# Patient Record
Sex: Male | Born: 1950 | Race: White | Hispanic: No | Smoking: Current every day smoker
Health system: Southern US, Community
[De-identification: ages and names within clinical notes are randomized; demographics above are authoritative.]

## PROBLEM LIST (undated history)

## (undated) DIAGNOSIS — H409 Unspecified glaucoma: Secondary | ICD-10-CM

## (undated) DIAGNOSIS — G4730 Sleep apnea, unspecified: Secondary | ICD-10-CM

## (undated) DIAGNOSIS — Q282 Arteriovenous malformation of cerebral vessels: Secondary | ICD-10-CM

## (undated) DIAGNOSIS — C801 Malignant (primary) neoplasm, unspecified: Secondary | ICD-10-CM

## (undated) DIAGNOSIS — J449 Chronic obstructive pulmonary disease, unspecified: Secondary | ICD-10-CM

## (undated) HISTORY — DX: Sleep apnea, unspecified: G47.30

## (undated) HISTORY — DX: Malignant (primary) neoplasm, unspecified: C80.1

## (undated) HISTORY — DX: Chronic obstructive pulmonary disease, unspecified: J44.9

---

## 2009-08-30 DIAGNOSIS — C801 Malignant (primary) neoplasm, unspecified: Secondary | ICD-10-CM

## 2009-08-30 HISTORY — DX: Malignant (primary) neoplasm, unspecified: C80.1

## 2009-08-30 HISTORY — PX: PROSTATE SURGERY: SHX751

## 2018-04-04 DIAGNOSIS — H5200 Hypermetropia, unspecified eye: Secondary | ICD-10-CM | POA: Diagnosis not present

## 2018-04-04 DIAGNOSIS — H2510 Age-related nuclear cataract, unspecified eye: Secondary | ICD-10-CM | POA: Diagnosis not present

## 2018-04-04 DIAGNOSIS — Z0100 Encounter for examination of eyes and vision without abnormal findings: Secondary | ICD-10-CM | POA: Diagnosis not present

## 2018-04-18 ENCOUNTER — Ambulatory Visit (INDEPENDENT_AMBULATORY_CARE_PROVIDER_SITE_OTHER): Payer: Medicare HMO | Admitting: Family Medicine

## 2018-04-18 ENCOUNTER — Encounter: Payer: Self-pay | Admitting: Family Medicine

## 2018-04-18 VITALS — BP 92/66 | HR 98 | Temp 97.1°F | Ht 72.0 in | Wt 225.1 lb

## 2018-04-18 DIAGNOSIS — I739 Peripheral vascular disease, unspecified: Secondary | ICD-10-CM

## 2018-04-18 DIAGNOSIS — F17210 Nicotine dependence, cigarettes, uncomplicated: Secondary | ICD-10-CM

## 2018-04-18 DIAGNOSIS — R6889 Other general symptoms and signs: Secondary | ICD-10-CM | POA: Diagnosis not present

## 2018-04-18 DIAGNOSIS — J439 Emphysema, unspecified: Secondary | ICD-10-CM | POA: Diagnosis not present

## 2018-04-18 DIAGNOSIS — I10 Essential (primary) hypertension: Secondary | ICD-10-CM

## 2018-04-18 DIAGNOSIS — F3341 Major depressive disorder, recurrent, in partial remission: Secondary | ICD-10-CM | POA: Diagnosis not present

## 2018-04-18 DIAGNOSIS — R5383 Other fatigue: Secondary | ICD-10-CM

## 2018-04-18 DIAGNOSIS — N390 Urinary tract infection, site not specified: Secondary | ICD-10-CM

## 2018-04-18 DIAGNOSIS — R319 Hematuria, unspecified: Secondary | ICD-10-CM

## 2018-04-18 DIAGNOSIS — C61 Malignant neoplasm of prostate: Secondary | ICD-10-CM

## 2018-04-18 DIAGNOSIS — F1010 Alcohol abuse, uncomplicated: Secondary | ICD-10-CM

## 2018-04-18 DIAGNOSIS — G4739 Other sleep apnea: Secondary | ICD-10-CM

## 2018-04-18 DIAGNOSIS — E782 Mixed hyperlipidemia: Secondary | ICD-10-CM

## 2018-04-18 HISTORY — DX: Major depressive disorder, recurrent, in partial remission: F33.41

## 2018-04-18 MED ORDER — TRAZODONE HCL 100 MG PO TABS: 50.0000 mg | ORAL_TABLET | Freq: Every day | ORAL | 2 refills | Status: DC

## 2018-04-18 NOTE — Patient Instructions (Signed)

## 2018-04-19 LAB — CBC WITH DIFFERENTIAL/PLATELET
BASOS: 1 %
Basophils Absolute: 0.1 10*3/uL (ref 0.0–0.2)
EOS (ABSOLUTE): 0.2 10*3/uL (ref 0.0–0.4)
EOS: 2 %
HEMATOCRIT: 52.1 % — AB (ref 37.5–51.0)
HEMOGLOBIN: 18 g/dL — AB (ref 13.0–17.7)
Immature Grans (Abs): 0 10*3/uL (ref 0.0–0.1)
Immature Granulocytes: 0 %
LYMPHS ABS: 1.7 10*3/uL (ref 0.7–3.1)
Lymphs: 21 %
MCH: 35 pg — AB (ref 26.6–33.0)
MCHC: 34.5 g/dL (ref 31.5–35.7)
MCV: 101 fL — AB (ref 79–97)
MONOCYTES: 14 %
MONOS ABS: 1.1 10*3/uL — AB (ref 0.1–0.9)
NEUTROS ABS: 5.2 10*3/uL (ref 1.4–7.0)
Neutrophils: 62 %
Platelets: 266 10*3/uL (ref 150–450)
RBC: 5.14 x10E6/uL (ref 4.14–5.80)
RDW: 14.1 % (ref 12.3–15.4)
WBC: 8.3 10*3/uL (ref 3.4–10.8)

## 2018-04-19 LAB — CMP14+EGFR
A/G RATIO: 1.2 (ref 1.2–2.2)
ALBUMIN: 3.6 g/dL (ref 3.6–4.8)
ALK PHOS: 113 IU/L (ref 39–117)
ALT: 18 IU/L (ref 0–44)
AST: 27 IU/L (ref 0–40)
BUN / CREAT RATIO: 8 — AB (ref 10–24)
BUN: 9 mg/dL (ref 8–27)
Bilirubin Total: 0.6 mg/dL (ref 0.0–1.2)
CO2: 28 mmol/L (ref 20–29)
CREATININE: 1.06 mg/dL (ref 0.76–1.27)
Calcium: 9.6 mg/dL (ref 8.6–10.2)
Chloride: 84 mmol/L — ABNORMAL LOW (ref 96–106)
GFR calc Af Amer: 82 mL/min/{1.73_m2} (ref >59)
GFR calc non Af Amer: 71 mL/min/{1.73_m2} (ref >59)
GLOBULIN, TOTAL: 2.9 g/dL (ref 1.5–4.5)
Glucose: 94 mg/dL (ref 65–99)
POTASSIUM: 4.8 mmol/L (ref 3.5–5.2)
SODIUM: 132 mmol/L — AB (ref 134–144)
Total Protein: 6.5 g/dL (ref 6.0–8.5)

## 2018-04-19 LAB — LIPID PANEL
CHOL/HDL RATIO: 2.7 ratio (ref 0.0–5.0)
CHOLESTEROL TOTAL: 182 mg/dL (ref 100–199)
HDL: 67 mg/dL (ref >39)
LDL CALC: 96 mg/dL (ref 0–99)
TRIGLYCERIDES: 95 mg/dL (ref 0–149)
VLDL Cholesterol Cal: 19 mg/dL (ref 5–40)

## 2018-04-19 LAB — TSH: TSH: 1.79 u[IU]/mL (ref 0.450–4.500)

## 2018-04-19 LAB — PSA, TOTAL AND FREE

## 2018-04-19 LAB — VITAMIN B12: Vitamin B-12: 1843 pg/mL — ABNORMAL HIGH (ref 232–1245)

## 2018-04-20 ENCOUNTER — Encounter: Payer: Self-pay | Admitting: Family Medicine

## 2018-04-20 DIAGNOSIS — G4739 Other sleep apnea: Secondary | ICD-10-CM | POA: Insufficient documentation

## 2018-04-20 NOTE — Progress Notes (Signed)
 Subjective:  Patient ID: Steven Howard, male    DOB: 10/02/1950  Age: 67 y.o. MRN: [id number]  CC: New Patient (Initial Visit) (pt here today to establish care)   HPI Steven Howard presents for new patient evaluation.  He recently moved here from [place].  He needs to have a primary care doctor.  He has a complicated history of prostate cancer and BPH with current issues of hesitancy and nocturia.  He has had work-ups in the past for hematuria as well.  Records from his previous urologist and fluoroscopy were reviewed.  Patient also states that he has been treated for elevated blood pressure and elevated cholesterol.  He is currently not taking any prescription medications.  He does take garlic for the cholesterol.  Patient has been a smoker for many years.  He has some dyspnea on exertion but not at rest currently.  He has used an inhaler in the past but currently is doing okay without one.  He has been diagnosed with COPD in the records that were forwarded to the office from his previous primary care physician.  Patient has difficulty getting a good night sleep he awakens multiple times he does not feel rested the next day.  Patient reports that he was diagnosed with sleep apnea when he lived in [place].  He does not know how to get supplies including the machine tubing facemask etc. since moving to [place].  He says that if his energy is not good.  Patient originally mentioned that he has about 3 drinks a week .  On further discussion this is actually 3 beers a day.  Depression screen PHQ 2/9 04/18/2018  Decreased Interest 0  Down, Depressed, Hopeless 0  PHQ - 2 Score 0    History Steven Howard has a past medical history of Cancer (HCC) (2011), COPD (chronic obstructive pulmonary disease) (HCC), and Sleep apnea.   He has a past surgical history that includes Prostate surgery (2011).   His family history includes Alcohol abuse in his brother; COPD in his brother.He reports that he quit  smoking about 10 years ago. His smoking use included cigars and cigarettes. He has a 30.00 pack-year smoking history. He has never used smokeless tobacco. He reports that he drinks about 3.0 standard drinks of alcohol per week. He reports that he does not use drugs.    ROS Review of Systems  Constitutional: Negative for activity change, fatigue and unexpected weight change.  HENT: Negative for congestion, ear pain, hearing loss, postnasal drip and trouble swallowing.   Eyes: Negative for pain and visual disturbance.  Respiratory: Positive for shortness of breath. Negative for cough and chest tightness.   Cardiovascular: Negative for chest pain, palpitations and leg swelling.  Gastrointestinal: Negative for abdominal distention, abdominal pain, blood in stool, constipation, diarrhea, nausea and vomiting.  Endocrine: Negative for cold intolerance, heat intolerance and polydipsia.  Genitourinary: Negative for difficulty urinating, dysuria, flank pain, frequency and urgency.  Musculoskeletal: Negative for arthralgias and joint swelling.  Skin: Negative for color change, rash and wound.  Neurological: Negative for dizziness, syncope, speech difficulty, weakness, light-headedness, numbness and headaches.  Hematological: Does not bruise/bleed easily.  Psychiatric/Behavioral: Positive for sleep disturbance. Negative for confusion, decreased concentration and dysphoric mood. The patient is not nervous/anxious.     Objective:  BP 92/66   Pulse 98   Temp (!) 97.1 F (36.2 C) (Oral)   Ht 6' (1.829 m)   Wt 225 lb 2 oz (102.1 kg)     BMI 30.53 kg/m   BP Readings from Last 3 Encounters:  04/18/18 92/66    Wt Readings from Last 3 Encounters:  04/18/18 225 lb 2 oz (102.1 kg)     Physical Exam  Constitutional: He is oriented to person, place, and time. He appears well-developed and well-nourished.  HENT:  Head: Normocephalic and atraumatic.  Mouth/Throat: Oropharynx is clear and moist.    Eyes: Pupils are equal, round, and reactive to light. EOM are normal.  Neck: Normal range of motion. No tracheal deviation present. No thyromegaly present.  Cardiovascular: Normal rate, regular rhythm and normal heart sounds. Exam reveals no gallop and no friction rub.  No murmur heard. Pulmonary/Chest: Breath sounds normal. He has no wheezes. He has no rales.  Abdominal: Soft. Bowel sounds are normal. He exhibits no distension and no mass. There is no tenderness. Hernia confirmed negative in the right inguinal area and confirmed negative in the left inguinal area.  Genitourinary: Testes normal and penis normal.  Musculoskeletal: Normal range of motion. He exhibits no edema.  Lymphadenopathy:    He has no cervical adenopathy.  Neurological: He is alert and oriented to person, place, and time.  Skin: Skin is warm and dry.  Psychiatric: He has a normal mood and affect.      Assessment & Plan:   Steven Howard was seen today for new patient (initial visit).  Diagnoses and all orders for this visit:  Essential hypertension  Prostate cancer (HCC) -     Ambulatory referral to Urology -     PSA, total and free  Frequent UTI  Pulmonary emphysema, unspecified emphysema type (HCC)  Alcohol abuse  PVD (peripheral vascular disease) (HCC)  Cigarette nicotine dependence without complication  Hematuria, unspecified type  Recurrent major depressive disorder, in partial remission (HCC)  Mixed hyperlipidemia -     CBC with Differential/Platelet -     CMP14+EGFR -     Lipid panel  Other fatigue -     TSH -     Vitamin B12  Other sleep apnea -     Ambulatory referral to Sleep Studies  Other orders -     traZODone (DESYREL) 100 MG tablet; Take 0.5-1 tablets (50-100 mg total) by mouth at bedtime.       I am having Steven Howard start on traZODone. I am also having him maintain his b complex vitamins and GARLIC PO.  Allergies as of 04/18/2018   No Known Allergies      Medication List        Accurate as of 04/18/18 11:59 PM. Always use your most recent med list.          b complex vitamins tablet Take 1 tablet by mouth daily.   GARLIC PO Take by mouth.   traZODone 100 MG tablet Commonly known as:  DESYREL Take 0.5-1 tablets (50-100 mg total) by mouth at bedtime.        Follow-up: Return in about 6 weeks (around 05/30/2018).  Warren Stacks, M.D. 

## 2018-04-24 ENCOUNTER — Encounter: Payer: Self-pay | Admitting: Neurology

## 2018-04-24 ENCOUNTER — Ambulatory Visit (INDEPENDENT_AMBULATORY_CARE_PROVIDER_SITE_OTHER): Payer: Medicare HMO | Admitting: Neurology

## 2018-04-24 VITALS — BP 119/78 | HR 89 | Ht 72.0 in | Wt 229.0 lb

## 2018-04-24 DIAGNOSIS — G4733 Obstructive sleep apnea (adult) (pediatric): Secondary | ICD-10-CM

## 2018-04-24 DIAGNOSIS — G4726 Circadian rhythm sleep disorder, shift work type: Secondary | ICD-10-CM | POA: Diagnosis not present

## 2018-04-24 DIAGNOSIS — F5103 Paradoxical insomnia: Secondary | ICD-10-CM

## 2018-04-24 DIAGNOSIS — J449 Chronic obstructive pulmonary disease, unspecified: Secondary | ICD-10-CM | POA: Diagnosis not present

## 2018-04-24 DIAGNOSIS — R0601 Orthopnea: Secondary | ICD-10-CM | POA: Diagnosis not present

## 2018-04-24 DIAGNOSIS — G4719 Other hypersomnia: Secondary | ICD-10-CM | POA: Diagnosis not present

## 2018-04-24 DIAGNOSIS — Z72821 Inadequate sleep hygiene: Secondary | ICD-10-CM | POA: Diagnosis not present

## 2018-04-24 DIAGNOSIS — R351 Nocturia: Secondary | ICD-10-CM | POA: Diagnosis not present

## 2018-04-24 DIAGNOSIS — N401 Enlarged prostate with lower urinary tract symptoms: Secondary | ICD-10-CM

## 2018-04-24 NOTE — Progress Notes (Signed)
SLEEP MEDICINE CLINIC   Provider:  Larey Seat, M D  Primary Care Physician:  Claretta Fraise, MD   Referring Provider: Claretta Fraise, MD   Chief Complaint  Patient presents with  . New Patient (Initial Visit)    pt alone, rm 11. pt states that in December 15, 2008 (or sometime around then) in [facility] had a sleep study. confirmed apnea and he was ordered a BiPap. was using the machine and there was insurance difficulties and was unable to afford. he states that he stopped using it around 2011/2012 due to unable to afford.     HPI:  Steven Howard is a 67 y.o. male patient, seen here in a referral from Dr. Livia Snellen. The patient moved from [facility] to [place] almost a year ago after being widowed, has just established himself at the new PCP . He had a sleep study in [facility] [place] [facility] [place]. Sleep study took place between the years 12/16/2006 on 2008-12-15, and apparently the patient was diagnosed with apnea and ordered a BiPAP machine, within 2 years or 3 years of using it there were insurance issues and he finally had to stop it around 8 years ago.  Dr. Livia Snellen also stated that the patient has a history of comp prostate cancer as well as benign prostate hyperplasia and has issues with urinary hesitancy and nocturia, hematuria have been reported.  He has been treated for hypertension and elevated cholesterol but has not been taking any prescription medication.  Has been a smoker for many years used an inhaler in the past and has dyspnea on exertion.  According to her previous records COPD was diagnosed.    Chief complaint according to patient : He has difficulties getting a good night sleep, awakens multiple times and does not feel refreshed and restored the following morning.  He feels fatigued and excessively daytime sleepy.   Sleep habits are as follows: the patient's dinnertime is around 7 PM, and he will spend the evening hours after dinner watching TV, he reports not falling asleep  while watching TV in the living room.  He will move over to his bedroom around 9:30 PM, his bedroom is cool, not quiet and not dark.  He does not sleep without TV and he sleeps poorly with TV. He sleeps supine on an adjustable bed- 25 degrees. He uses 4 pillows- he almost sits up in bed- orthopnea.   He does not read in bed usually, and he reports that he wakes up so frequently after going to sleep at 10- PM, he will wake up about every 2 hours also, has to go to the bathroom, up to 5 times each night- he denies any palpitations, pain, discomfort or shortness of breath. He dreams Sometimes.  No lightheadedness.  He does not wake with headaches no new headaches wake him.  He rises between 8 and 9 AM, waking up spontaneously.  Each bathroom breaks takes him about half an hour before he can go back to sleep, and he estimates that he will get about 7 hours of sleep out of the 12-hour time in bed.  Sleep medical history and family sleep history:  " Nobody else has apnea".    Social history:  Widowed after " 43 years of marriage in 16-Dec-2014" , he must have been 21 at the time.  2 grown sons, 2 granddaughters..  Originally from [place] her return to this state  12-15-2016 after his wife died. Drinks daily 2-3  beers , and additionally vodka or bourbon at bed time.  Cut way back on smoking to 1/2 ppd, used to smoke 1.5 ppd, she started at age 12. He worked swing shifts for decades. Manufacturing. Retired in 05-2017.    Review of Systems: Out of a complete 14 system review, the patient complains of only the following symptoms, and all other reviewed systems are negative. Snoring, insomnia, orthopnea- - now on Trazodone, nocturia. EDS- hard of hearing - tinnitus.  Getting winded very easily. SOB  Epworth Sleepiness score endorsed at 15/ 24 points  , Fatigue severity score 42/63 points.  , Geriatric depression score 2/ 15 points    Social History   Socioeconomic History  . Marital status: Widowed    Spouse name: Not on  file  . Number of children: Not on file  . Years of education: Not on file  . Highest education level: Not on file  Occupational History  . Not on file  Social Needs  . Financial resource strain: Not on file  . Food insecurity:    Worry: Not on file    Inability: Not on file  . Transportation needs:    Medical: Not on file    Non-medical: Not on file  Tobacco Use  . Smoking status: Former Smoker    Packs/day: 1.00    Years: 30.00    Pack years: 30.00    Types: Cigars, Cigarettes    Last attempt to quit: 2009    Years since quitting: 10.6  . Smokeless tobacco: Never Used  Substance and Sexual Activity  . Alcohol use: Yes    Alcohol/week: 3.0 standard drinks    Types: 3 Standard drinks or equivalent per week  . Drug use: Never  . Sexual activity: Not Currently  Lifestyle  . Physical activity:    Days per week: Not on file    Minutes per session: Not on file  . Stress: Not on file  Relationships  . Social connections:    Talks on phone: Not on file    Gets together: Not on file    Attends religious service: Not on file    Active member of club or organization: Not on file    Attends meetings of clubs or organizations: Not on file    Relationship status: Not on file  . Intimate partner violence:    Fear of current or ex partner: Not on file    Emotionally abused: Not on file    Physically abused: Not on file    Forced sexual activity: Not on file  Other Topics Concern  . Not on file  Social History Narrative  . Not on file    Family History  Problem Relation Age of Onset  . Alcohol abuse Brother   . COPD Brother     Past Medical History:  Diagnosis Date  . Cancer ([place]) 2011  . COPD (chronic obstructive pulmonary disease) ([place])   . Sleep apnea     Past Surgical History:  Procedure Laterality Date  . PROSTATE SURGERY  2011   seeds implanted/radiation    Current Outpatient Medications  Medication Sig Dispense Refill  . b complex vitamins tablet Take  1 tablet by mouth daily.    Marland Kitchen GARLIC PO Take by mouth.    . traZODone (DESYREL) 100 MG tablet Take 0.5-1 tablets (50-100 mg total) by mouth at bedtime. 30 tablet 2   No current facility-administered medications for this visit.     Allergies as of 04/24/2018  . (  No Known Allergies)    Vitals: BP 119/78   Pulse 89   Ht 6' (1.829 m)   Wt 229 lb (103.9 kg)   BMI 31.06 kg/m  Last Weight:  Wt Readings from Last 1 Encounters:  04/24/18 229 lb (103.9 kg)   PPJ:KDTO mass index is 31.06 kg/m.     Last Height:   Ht Readings from Last 1 Encounters:  04/24/18 6' (1.829 m)    Physical exam:  General: The patient is awake, alert and appears not in acute distress. The patient is well groomed. Head: Normocephalic, atraumatic. Neck is supple. Mallampati 2 neck circumference:18. 25 - with a goiter ? . Nasal airflow patent ,  Retrognathia is not seen.  Poor dental status- has many gaps and no dentures.  Cardiovascular:  Regular rate and rhythm , without  murmurs or carotid bruit, and without distended neck veins. Respiratory: Lungs are clear to auscultation. Skin:  Without evidence of edema, or rash Trunk: BMI is 31.06. The patient's posture is hunched.   Neurologic exam : The patient is awake and alert, oriented to place and time.   Memory subjective  described as intact.  Attention span & concentration ability appears normal.  Speech is fluent,  with dysarthria / dysphonia.  Mood and affect are appropriate.  Cranial nerves: Pupils are equal and briskly reactive to light.  Funduscopic exam complicated - beginning cataracts , he has no evidence of pallor or edema. Extraocular movements in vertical and horizontal planes intact and without nystagmus. He needs new glasses. Visual fields by finger perimetry are intact. Hearing significantly impaired  Facial sensation intact to fine touch. Facial motor strength is symmetric and tongue and uvula move midline. Shoulder shrug was symmetrical.    Motor exam:   Normal tone, muscle bulk and symmetric strength in all extremities. Has slight cog-wheeling over his left biceps and denies any injuries.  Sensory:  Fine touch, pinprick and vibration were intact.  Proprioception tested in the upper extremities was normal. Coordination:  Finger-to-nose maneuver normal without evidence of ataxia, dysmetria or tremor. Gait and station: Patient walks without assistive device and is able unassisted to climb up to the exam table. Strength within normal limits.  Stance is stable and normal.  He is slightly stooped.   Deep tendon reflexes: in the  upper and lower extremities are symmetric and intact.   Mr. Dansby is a 68 year old right-handed Caucasian widowed gentleman with a approximately 12-year history of OSA with COPD, but currently unable to use his CPAP machine due to lack of supplies. He reportedly slept all night , had no nocturia and felt refreshed  when using CPAP .   Assessment:  After physical and neurologic examination, review of laboratory studies,  Personal review of imaging studies, reports of other /same  Imaging studies, results of polysomnography and / or neurophysiology testing and pre-existing records as far as provided in visit., my assessment is   1)  History of OSA with COPD, hypoxemia, orthopnea= needs a new SPLIT NIGHT PSG for dx confirmation, evaluation of Co2 retention and hypoxemia, and titration to CPAP.  He once used a FFM but it gave air leaks. Dry mouth in spite FFM.   2) insomnia , lives alone - sleep hygiene is poor, TV on , stays a long time in bed, not always to sleep- he reports he can not read in bed.   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     3) hearing loss- was unable to answer many questions directly , needed me to rephrase and repeat.   4)smoking cessation.    The patient was advised of the nature of the diagnosed  disorder , the treatment options and the  risks for general health and wellness arising from not treating the condition.   I spent more than 50  minutes of face to face time with the patient.  Greater than 50% of time was spent in counseling and coordination of care. We have discussed the diagnosis and differential and I answered the patient's questions.    Plan:  Treatment plan and additional workup : SPLIT night PSG to allow diagnosis, titration and modality changes and , if needed, oxygen supplementation.  CO2 if available  Patient needs a wedge in bed. ORTHOPNEA .  Sleep hygiene - 14 day boot camp letter given, I offered modafinil for PRN use in EDS, driving etc.  Rv after SPLIT study.    Larey Seat, MD 1/61/0960, 4:54 PM  Certified in Neurology by ABPN Certified in [place] by [facility] [address], [place], [place] [address]

## 2018-05-04 ENCOUNTER — Telehealth: Payer: Self-pay

## 2018-05-04 NOTE — Telephone Encounter (Signed)
Patient is calling to check on his urology referral.  Has not heard anything about an appointment yet.  Please check and advise.

## 2018-05-04 NOTE — Telephone Encounter (Signed)
Still pending with [facility]

## 2018-05-05 ENCOUNTER — Telehealth: Payer: Self-pay | Admitting: Family Medicine

## 2018-05-11 ENCOUNTER — Ambulatory Visit (INDEPENDENT_AMBULATORY_CARE_PROVIDER_SITE_OTHER): Payer: Medicare HMO | Admitting: Neurology

## 2018-05-11 DIAGNOSIS — N401 Enlarged prostate with lower urinary tract symptoms: Secondary | ICD-10-CM

## 2018-05-11 DIAGNOSIS — J449 Chronic obstructive pulmonary disease, unspecified: Principal | ICD-10-CM

## 2018-05-11 DIAGNOSIS — F10982 Alcohol use, unspecified with alcohol-induced sleep disorder: Secondary | ICD-10-CM

## 2018-05-11 DIAGNOSIS — R351 Nocturia: Secondary | ICD-10-CM

## 2018-05-11 DIAGNOSIS — G4733 Obstructive sleep apnea (adult) (pediatric): Secondary | ICD-10-CM

## 2018-05-11 DIAGNOSIS — G4719 Other hypersomnia: Secondary | ICD-10-CM

## 2018-05-11 DIAGNOSIS — R0601 Orthopnea: Secondary | ICD-10-CM

## 2018-05-11 DIAGNOSIS — Z72821 Inadequate sleep hygiene: Secondary | ICD-10-CM

## 2018-05-11 DIAGNOSIS — G4726 Circadian rhythm sleep disorder, shift work type: Secondary | ICD-10-CM

## 2018-05-17 DIAGNOSIS — N451 Epididymitis: Secondary | ICD-10-CM | POA: Diagnosis not present

## 2018-05-17 DIAGNOSIS — N401 Enlarged prostate with lower urinary tract symptoms: Secondary | ICD-10-CM | POA: Diagnosis not present

## 2018-05-17 DIAGNOSIS — C61 Malignant neoplasm of prostate: Secondary | ICD-10-CM | POA: Diagnosis not present

## 2018-05-17 DIAGNOSIS — R350 Frequency of micturition: Secondary | ICD-10-CM | POA: Diagnosis not present

## 2018-05-19 DIAGNOSIS — J449 Chronic obstructive pulmonary disease, unspecified: Principal | ICD-10-CM

## 2018-05-19 DIAGNOSIS — G4726 Circadian rhythm sleep disorder, shift work type: Secondary | ICD-10-CM | POA: Insufficient documentation

## 2018-05-19 DIAGNOSIS — G4719 Other hypersomnia: Secondary | ICD-10-CM | POA: Insufficient documentation

## 2018-05-19 DIAGNOSIS — R0601 Orthopnea: Secondary | ICD-10-CM | POA: Insufficient documentation

## 2018-05-19 DIAGNOSIS — G4733 Obstructive sleep apnea (adult) (pediatric): Secondary | ICD-10-CM | POA: Insufficient documentation

## 2018-05-19 HISTORY — DX: Circadian rhythm sleep disorder, shift work type: G47.26

## 2018-05-19 NOTE — Addendum Note (Signed)
Addended by: Larey Seat on: 05/19/2018 11:20 AM   Modules accepted: Orders

## 2018-05-19 NOTE — Procedures (Signed)
PATIENT'S NAME:  Steven Howard, Steven Howard DOB:      1951-02-02      MR#:    [id number]     DATE OF RECORDING: 05/11/2018 REFERRING M.D.:  Claretta Fraise, MD Study Performed:  Split-Night Titration Study HISTORY:  HASEEB FIALLOS is a 67 y.o. male patient, seen here in a referral from Dr. Livia Snellen. The patient moved from [facility] to [place] almost a year ago. He had a sleep study in [facility] [place] @ the [employer], between the years 2008 and 2010, and apparently was diagnosed with COPD, Complex Sleep apnea. He was ordered a BiPAP machine, but within 2 years or 3 years of using it there were insurance issues and he finally had to stop it around 8 years ago. He reported a dry mouth while using a FFM. He has now orthopnea, sleeping on 4-5 pillows. He is still smoking and consuming alcoholic drinks at bedtime. Nocturia times 3-4. 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   The patient's weight 229 pounds with a height of 72 (inches), resulting in a BMI of 31.1 kg/m2.The patient's neck circumference measured 18.2 inches.  CURRENT MEDICATIONS: B complex Vitamins, Garlic, Desyrel  PROCEDURE:  This is a multichannel digital polysomnogram utilizing the Somnostar 11.2 system.  Electrodes and sensors were applied and monitored per AASM Specifications.   EEG, EOG, Chin and Limb EMG, were sampled at 200 Hz.  ECG, Snore and Nasal Pressure, Thermal Airflow, Respiratory Effort, CPAP Flow and Pressure, Oximetry was sampled at 50 Hz. Digital video and audio were recorded.      BASELINE STUDY WITHOUT CPAP RESULTS: Lights Out was at 20:54 and Lights On at 05:02.  Total recording time (TRT) was 95.5, with a total sleep time (TST) of 69.5 minutes.   The patient's sleep latency was 21 minutes.  REM latency had to be corrected- it was 32 minutes. The sleep efficiency was 72.8 %.    SLEEP ARCHITECTURE: WASO (Wake after sleep onset) was 1 minutes,  Stage N1 was 2 minutes, Stage N2 was 44 minutes, Stage N3 was 0 minutes and Stage R (REM sleep) was 23.5 minutes.  The percentages were Stage N1 2.9%, Stage N2 63.3%, Stage N3 0% and Stage R (REM sleep) 33.8%.   RESPIRATORY ANALYSIS:  There were a total of 103 respiratory events:  99 obstructive apneas, 0 central apneas and 4 hypopneas with a hypopnea index of 3.5. The patient also had 10 respiratory event related arousals (RERAs).  Snoring was noted. The total APNEA/HYPOPNEA INDEX (AHI) was 88.9 /hour and the total RESPIRATORY DISTURBANCE INDEX was 98.0 /hour.  28 events occurred in REM sleep and 8 events in NREM. The REM AHI was 71.5 /hour versus a non-REM AHI of 97.8 /hour. The patient spent 446 minutes sleep time on a wedge in the supine position with an all supine AHI of 89.0 /hour.  OXYGEN SATURATION & C02:  The wake baseline 02 saturation was 96%, with the lowest being 54%. Time spent below 89% saturation equaled 67 minutes. The average End Tidal CO2 during sleep was 44 torr.  Peak End Tidal CO2 during REM sleep was 55 torr and peak Co2 in NREM sleep was 53 torr. Time above 50 torr was 8 minutes. Desaturation episodes were longer than 90 seconds and oxygen was supplemented after 1 hour sleep time.  PERIODIC LIMB MOVEMENTS: The patient had a total of 0 Periodic Limb Movements.  Audio and video analysis did not show any abnormal or unusual movements, behaviors, phonations or vocalizations. No nocturia. Snoring was noted. EKG with PACs and many PVCs, mostly in keeping with normal sinus rhythm (NSR).  TITRATION STUDY WITH CPAP RESULTS:   CPAP was initiated at 5 cmH20 with heated humidity per AASM split night standards and pressure was advanced to 20 cmH20 because of hypopneas, apneas and desaturations.  At pressure of 11 cm water oxygen was added at 1 liter, at CPAP pressure of 14 cm additional increase to 2 liters 02, At a PAP pressure of 20 cmH20, there was still no reduction in AHI (37.9/h with  nadir at 70% 02). Changed to BiPAP , beginning with 20/15 cm water and increased to a final 24/19 cm water BiPAP pressure with a reduction of the AHI to 4.8/hour, 02 nadir at 84% -under 2 liters of 02.    Total recording time (TRT) was 393 minutes, with a total sleep time (TST) of 376.5 minutes. The patient's sleep latency was 4 minutes. REM latency was 98 minutes.  The sleep efficiency was 95.8 %.    SLEEP ARCHITECTURE: Wake after sleep was 14.5 minutes, Stage N1 8 minutes, Stage N2 130.5 minutes, Stage N3 110.5 minutes and Stage R (REM sleep) 127.5 minutes. The percentages were: Stage N1 2.1%, Stage N2 34.7%, Stage N3 29.3% and Stage R (REM sleep) 33.9%.   RESPIRATORY ANALYSIS:  There were a total of 171 respiratory events: 90 obstructive apneas, 5 central apneas and 0 mixed apneas with a total of 95 apneas and an apnea index (AI) of 15.1. There were 76 hypopneas with a hypopnea index of 12.1 /hour. The patient also had 0 respiratory event related arousals (RERAs). The total APNEA/HYPOPNEA INDEX (AHI) was 27.3 /hour and the total RESPIRATORY DISTURBANCE INDEX was 27.3 /hour.  24 events occurred in REM sleep and 147 events in NREM. The REM AHI was 11.3 /hour versus a non-REM AHI of 35.4 /hour. The patient spent 100% of total sleep time in reclined-supine position.  OXYGEN SATURATION & C02:  The wake baseline 02 saturation was 90%, with the lowest being 65%. Time spent below 89% saturation equaled 140 minutes under oxygen supplementation.  PERIODIC LIMB MOVEMENTS:   The patient had a total of 256 Periodic Limb Movements. The Periodic Limb Movement (PLM) index was 40.8 /hour and the PLM Arousal index was 1.0 /hour. The arousals were noted as: 26 were spontaneous, 6 were associated with PLMs, and 108 were associated with respiratory events.  Post-study, the patient indicated that sleep was much better than usual.  The patient was fitted with a SIMPLUS FFM in MEDIUM size  POLYSOMNOGRAPHY IMPRESSION :    1. COPD related hypoventilation, orthopnea and obstructive sleep apnea - severe hypoxemia and hypercapnia. 2. EKG: PVCs frequently seen   RECOMMENDATIONS: Mr. Mcenroe severe Obstructive Sleep Apnea, COPD related hypoxemia and hypercapnia were finally improved at BiPAP settings of 24/19 cm water, but 2 liters oxygen were still needed to improve hypoxemia, especially in REM sleep, and a SIMPLUS FFM was fitted. The patient will continue to sleep reclined, has orthopnea.     A follow up appointment will be scheduled in the Sleep Clinic at [facility].    I certify that I have reviewed the entire raw data recording prior to the issuance of this report in accordance with the Standards of Accreditation of the American Academy of Sleep Medicine (AASM)  Larey Seat, M.D.  05-19-2018  [place], [place] Board of Psychiatry  and Neurology  Diplomat, American Board of Sleep Medicine Market researcher, [employer] [facility] [employer]

## 2018-05-22 ENCOUNTER — Telehealth: Payer: Self-pay | Admitting: Neurology

## 2018-05-22 NOTE — Telephone Encounter (Signed)
I called Steven Howard. I advised Steven Howard that Dr. Brett Fairy reviewed their sleep study results and found that Steven Howard has moderate to severe sleep apnea. Dr. Brett Fairy recommends that Steven Howard starts Bipap machine with 2 L oxygen. I reviewed PAP compliance expectations with the Steven Howard. Steven Howard is agreeable to starting a CPAP. I advised Steven Howard that an order will be sent to a DME, [facility], and [facility] will call the Steven Howard within about one week after they file with the Steven Howard's insurance. [facility] will show the Steven Howard how to use the machine, fit for masks, and troubleshoot the CPAP if needed. A follow up appt was made for insurance purposes with Ward Givens  on Jan 2,2019 at 2:30 pm. Steven Howard verbalized understanding to arrive 15 minutes early and bring their CPAP. A letter with all of this information in it will be mailed to the Steven Howard as a reminder. I verified with the Steven Howard that the address we have on file is correct. Steven Howard verbalized understanding of results. Steven Howard had no questions at this time but was encouraged to call back if questions arise.

## 2018-05-22 NOTE — Telephone Encounter (Signed)
-----   Message from Larey Seat, MD sent at 05/19/2018 11:20 AM EDT ----- Steven Howard's severe Obstructive Sleep Apnea, COPD related hypoxemia and hypercapnia were finally improved at BiPAP settings of 24/19 cm water, but 2 liters oxygen were still needed to improve hypoxemia, especially in REM sleep. A  SIMPLUS model of a medium sized FFM was fitted. The patient will continue to sleep reclined, has orthopnea.  Prescription for Both BiPAP, and oxygen will be issued.

## 2018-05-30 ENCOUNTER — Ambulatory Visit (INDEPENDENT_AMBULATORY_CARE_PROVIDER_SITE_OTHER): Payer: Medicare HMO | Admitting: Family Medicine

## 2018-05-30 ENCOUNTER — Encounter: Payer: Self-pay | Admitting: Family Medicine

## 2018-05-30 VITALS — BP 104/66 | HR 95 | Temp 97.3°F | Ht 72.0 in | Wt 229.6 lb

## 2018-05-30 DIAGNOSIS — J439 Emphysema, unspecified: Secondary | ICD-10-CM | POA: Diagnosis not present

## 2018-05-30 DIAGNOSIS — I10 Essential (primary) hypertension: Secondary | ICD-10-CM | POA: Diagnosis not present

## 2018-05-30 DIAGNOSIS — E871 Hypo-osmolality and hyponatremia: Secondary | ICD-10-CM

## 2018-05-30 MED ORDER — ALBUTEROL SULFATE (2.5 MG/3ML) 0.083% IN NEBU
2.5000 mg | INHALATION_SOLUTION | Freq: Once | RESPIRATORY_TRACT | Status: AC
  Administered 2018-05-30: 2.5 mg via RESPIRATORY_TRACT

## 2018-05-30 MED ORDER — TRAZODONE HCL 100 MG PO TABS: 50.0000 mg | ORAL_TABLET | Freq: Every day | ORAL | 2 refills | Status: DC

## 2018-05-30 MED ORDER — FLUTICASONE FUROATE-VILANTEROL 100-25 MCG/INH IN AEPB: 1.0000 | INHALATION_SPRAY | Freq: Every day | RESPIRATORY_TRACT | 2 refills | Status: DC

## 2018-05-30 MED ORDER — TRIAMTERENE-HCTZ 37.5-25 MG PO TABS: 1.0000 | ORAL_TABLET | Freq: Every day | ORAL | 3 refills | Status: DC

## 2018-05-30 NOTE — Progress Notes (Signed)
Subjective:  Patient ID: Steven Howard, male    DOB: [birth year]  Age: 67 y.o. MRN: [id number]  CC: Medical Management of Chronic Issues   HPI Steven Howard presents for  chronic shortness of breath.  He is a smoker daily.  He has been all of his adult life.  However he has cut back some.  He watched his wife died of emphysema at age 68 because she would not use oxygen.  He is interested in discontinuing.  He is here today for pulmonary function testing.  His testing was not saved through computer error but it did show a moderate obstruction with mixed restrictive pattern.  Additionally he is seen today due to a low sodium.  Sodium was 132 and his most recent blood work.  He states had no symptoms, the only exception is mild pedal edema.   History Steven Howard has a past medical history of Cancer ([place]) (2011), COPD (chronic obstructive pulmonary disease) ([place]), and Sleep apnea.   He has a past surgical history that includes Prostate surgery (2011).   His family history includes Alcohol abuse in his brother; COPD in his brother.He reports that he quit smoking about 10 years ago. His smoking use included cigars and cigarettes. He has a 30.00 pack-year smoking history. He has never used smokeless tobacco. He reports that he drinks about 3.0 standard drinks of alcohol per week. He reports that he does not use drugs.  Current Outpatient Medications on File Prior to Visit  Medication Sig Dispense Refill  . tamsulosin (FLOMAX) 0.4 MG CAPS capsule      No current facility-administered medications on file prior to visit.     ROS Review of Systems  Constitutional: Negative.   HENT: Negative.   Eyes: Negative for visual disturbance.  Respiratory: Positive for cough and shortness of breath.   Cardiovascular: Positive for leg swelling. Negative for chest pain.  Gastrointestinal: Negative for abdominal pain, diarrhea, nausea and vomiting.  Genitourinary: Negative for difficulty urinating.    Musculoskeletal: Negative for arthralgias and myalgias.  Skin: Negative for rash.  Neurological: Negative for headaches.  Psychiatric/Behavioral: Negative for sleep disturbance.    Objective:  BP 104/66   Pulse 95   Temp (!) 97.3 F (36.3 C) (Oral)   Ht 6' (1.829 m)   Wt 229 lb 9.6 oz (104.1 kg)   BMI 31.14 kg/m   BP Readings from Last 3 Encounters:  05/30/18 104/66  04/24/18 119/78  04/18/18 92/66    Wt Readings from Last 3 Encounters:  05/30/18 229 lb 9.6 oz (104.1 kg)  04/24/18 229 lb (103.9 kg)  04/18/18 225 lb 2 oz (102.1 kg)     Physical Exam  Constitutional: He is oriented to person, place, and time. He appears well-developed and well-nourished.  HENT:  Head: Normocephalic and atraumatic.  Right Ear: External ear normal.  Left Ear: External ear normal.  Mouth/Throat: No oropharyngeal exudate or posterior oropharyngeal erythema.  Eyes: Pupils are equal, round, and reactive to light.  Neck: Normal range of motion. Neck supple.  Cardiovascular: Normal rate and regular rhythm.  No murmur heard. Pulmonary/Chest: Effort normal. No respiratory distress. He has no wheezes. He has no rales.  Breath sounds are somewhat distant.  Neurological: He is alert and oriented to person, place, and time.  Vitals reviewed.     Assessment & Plan:   Steven Howard was seen today for medical management of chronic issues.  Diagnoses and all orders for this visit:  Pulmonary emphysema, unspecified emphysema  type (HCC) -     PR EVAL OF BRONCHOSPASM -     albuterol (PROVENTIL) (2.5 MG/3ML) 0.083% nebulizer solution 2.5 mg -     BMP8+EGFR  Hyponatremia -     BMP8+EGFR  Essential hypertension  Other orders -     traZODone (DESYREL) 100 MG tablet; Take 0.5-1 tablets (50-100 mg total) by mouth at bedtime. -     triamterene-hydrochlorothiazide (MAXZIDE-25) 37.5-25 MG tablet; Take 1 tablet by mouth daily. -     fluticasone furoate-vilanterol (BREO ELLIPTA) 100-25 MCG/INH AEPB; Inhale  1 puff into the lungs daily.   Allergies as of 05/30/2018   No Known Allergies     Medication List        Accurate as of 05/30/18  6:19 PM. Always use your most recent med list.          fluticasone furoate-vilanterol 100-25 MCG/INH Aepb Commonly known as:  BREO ELLIPTA Inhale 1 puff into the lungs daily.   tamsulosin 0.4 MG Caps capsule Commonly known as:  FLOMAX   traZODone 100 MG tablet Commonly known as:  DESYREL Take 0.5-1 tablets (50-100 mg total) by mouth at bedtime.   triamterene-hydrochlorothiazide 37.5-25 MG tablet Commonly known as:  MAXZIDE-25 Take 1 tablet by mouth daily.       Meds ordered this encounter  Medications  . traZODone (DESYREL) 100 MG tablet    Sig: Take 0.5-1 tablets (50-100 mg total) by mouth at bedtime.    Dispense:  30 tablet    Refill:  2  . albuterol (PROVENTIL) (2.5 MG/3ML) 0.083% nebulizer solution 2.5 mg  . triamterene-hydrochlorothiazide (MAXZIDE-25) 37.5-25 MG tablet    Sig: Take 1 tablet by mouth daily.    Dispense:  90 tablet    Refill:  3  . fluticasone furoate-vilanterol (BREO ELLIPTA) 100-25 MCG/INH AEPB    Sig: Inhale 1 puff into the lungs daily.    Dispense:  60 each    Refill:  2      Follow-up: Return in about 3 months (around 08/30/2018).  Claretta Fraise, M.D.

## 2018-05-31 LAB — BMP8+EGFR
BUN/Creatinine Ratio: 9 — ABNORMAL LOW (ref 10–24)
BUN: 6 mg/dL — ABNORMAL LOW (ref 8–27)
CALCIUM: 9.3 mg/dL (ref 8.6–10.2)
CO2: 29 mmol/L (ref 20–29)
Chloride: 91 mmol/L — ABNORMAL LOW (ref 96–106)
Creatinine, Ser: 0.69 mg/dL — ABNORMAL LOW (ref 0.76–1.27)
GFR, EST AFRICAN AMERICAN: 112 mL/min/{1.73_m2} (ref >59)
GFR, EST NON AFRICAN AMERICAN: 97 mL/min/{1.73_m2} (ref >59)
Glucose: 86 mg/dL (ref 65–99)
POTASSIUM: 4.3 mmol/L (ref 3.5–5.2)
Sodium: 136 mmol/L (ref 134–144)

## 2018-06-06 DIAGNOSIS — G4733 Obstructive sleep apnea (adult) (pediatric): Secondary | ICD-10-CM | POA: Diagnosis not present

## 2018-06-07 DIAGNOSIS — C61 Malignant neoplasm of prostate: Secondary | ICD-10-CM | POA: Diagnosis not present

## 2018-06-07 DIAGNOSIS — N451 Epididymitis: Secondary | ICD-10-CM | POA: Diagnosis not present

## 2018-06-07 DIAGNOSIS — N35011 Post-traumatic bulbous urethral stricture: Secondary | ICD-10-CM | POA: Diagnosis not present

## 2018-06-08 ENCOUNTER — Other Ambulatory Visit: Payer: Self-pay | Admitting: Urology

## 2018-06-15 NOTE — Patient Instructions (Signed)
Steven Howard  06/15/2018     @PREFPERIOPPHARMACY @   Your procedure is scheduled on  06/27/2018 .  Report to [facility] at  615  A.M.  Call this number if you have problems the morning of surgery:  [phone]   Remember:  Do not eat or drink after midnight.                         Take these medicines the morning of surgery with A SIP OF WATER  Flomax, maxzide.Use your inhaler before you come.    Do not wear jewelry, make-up or nail polish.  Do not wear lotions, powders, or perfumes, or deodorant.  Do not shave 48 hours prior to surgery.  Men may shave face and neck.  Do not bring valuables to the hospital.  [facility] is not responsible for any belongings or valuables.  Contacts, dentures or bridgework may not be worn into surgery.  Leave your suitcase in the car.  After surgery it may be brought to your room.  For patients admitted to the hospital, discharge time will be determined by your treatment team.  Patients discharged the day of surgery will not be allowed to drive home.   Name and phone number of your driver:   family Special instructions:  None  Please read over the following fact sheets that you were given. Anesthesia Post-op Instructions and Care and Recovery After Surgery       Cystoscopy Cystoscopy is a procedure that is used to help diagnose and sometimes treat conditions that affect that lower urinary tract. The lower urinary tract includes the bladder and the tube that drains urine from the bladder out of the body (urethra). Cystoscopy is performed with a thin, tube-shaped instrument with a light and camera at the end (cystoscope). The cystoscope may be hard (rigid) or flexible, depending on the goal of the procedure.The cystoscope is inserted through the urethra, into the bladder. Cystoscopy may be recommended if you have:  Urinary tractinfections that keep coming back (recurring).  Blood in the urine (hematuria).  Loss of  bladder control (urinary incontinence) or an overactive bladder.  Unusual cells found in a urine sample.  A blockage in the urethra.  Painful urination.  An abnormality in the bladder found during an intravenous pyelogram (IVP) or CT scan.  Cystoscopy may also be done to remove a sample of tissue to be examined under a microscope (biopsy). Tell a health care provider about:  Any allergies you have.  All medicines you are taking, including vitamins, herbs, eye drops, creams, and over-the-counter medicines.  Any problems you or family members have had with anesthetic medicines.  Any blood disorders you have.  Any surgeries you have had.  Any medical conditions you have.  Whether you are pregnant or may be pregnant. What are the risks? Generally, this is a safe procedure. However, problems may occur, including:  Infection.  Bleeding.  Allergic reactions to medicines.  Damage to other structures or organs.  What happens before the procedure?  Ask your health care provider about: ? Changing or stopping your regular medicines. This is especially important if you are taking diabetes medicines or blood thinners. ? Taking medicines such as aspirin and ibuprofen. These medicines can thin your blood. Do not take these medicines before your procedure if your health care provider instructs you not to.  Follow instructions from your health care  provider about eating or drinking restrictions.  You may be given antibiotic medicine to help prevent infection.  You may have an exam or testing, such as X-rays of the bladder, urethra, or kidneys.  You may have urine tests to check for signs of infection.  Plan to have someone take you home after the procedure. What happens during the procedure?  To reduce your risk of infection,your health care team will wash or sanitize their hands.  You will be given one or more of the following: ? A medicine to help you relax (sedative). ? A  medicine to numb the area (local anesthetic).  The area around the opening of your urethra will be cleaned.  The cystoscope will be passed through your urethra into your bladder.  Germ-free (sterile)fluid will flow through the cystoscope to fill your bladder. The fluid will stretch your bladder so that your surgeon can clearly examine your bladder walls.  The cystoscope will be removed and your bladder will be emptied. The procedure may vary among health care providers and hospitals. What happens after the procedure?  You may have some soreness or pain in your abdomen and urethra. Medicines will be available to help you.  You may have some blood in your urine.  Do not drive for 24 hours if you received a sedative. This information is not intended to replace advice given to you by your health care provider. Make sure you discuss any questions you have with your health care provider. Document Released: 08/13/2000 Document Revised: 12/25/2015 Document Reviewed: 07/03/2015 Elsevier Interactive Patient Education  2018 [employer].  Cystoscopy, Care After Refer to this sheet in the next few weeks. These instructions provide you with information about caring for yourself after your procedure. Your health care provider may also give you more specific instructions. Your treatment has been planned according to current medical practices, but problems sometimes occur. Call your health care provider if you have any problems or questions after your procedure. What can I expect after the procedure? After the procedure, it is common to have:  Mild pain when you urinate. Pain should stop within a few minutes after you urinate. This may last for up to 1 week.  A small amount of blood in your urine for several days.  Feeling like you need to urinate but producing only a small amount of urine.  Follow these instructions at home:  Medicines  Take over-the-counter and prescription medicines only as  told by your health care provider.  If you were prescribed an antibiotic medicine, take it as told by your health care provider. Do not stop taking the antibiotic even if you start to feel better. General instructions   Return to your normal activities as told by your health care provider. Ask your health care provider what activities are safe for you.  Do not drive for 24 hours if you received a sedative.  Watch for any blood in your urine. If the amount of blood in your urine increases, call your health care provider.  Follow instructions from your health care provider about eating or drinking restrictions.  If a tissue sample was removed for testing (biopsy) during your procedure, it is your responsibility to get your test results. Ask your health care provider or the department performing the test when your results will be ready.  Drink enough fluid to keep your urine clear or pale yellow.  Keep all follow-up visits as told by your health care provider. This is important. Contact a  health care provider if:  You have pain that gets worse or does not get better with medicine, especially pain when you urinate.  You have difficulty urinating. Get help right away if:  You have more blood in your urine.  You have blood clots in your urine.  You have abdominal pain.  You have a fever or chills.  You are unable to urinate. This information is not intended to replace advice given to you by your health care provider. Make sure you discuss any questions you have with your health care provider. Document Released: 03/05/2005 Document Revised: 01/22/2016 Document Reviewed: 07/03/2015 Elsevier Interactive Patient Education  2018 [place] Anesthesia, Adult General anesthesia is the use of medicines to make a person "go to sleep" (be unconscious) for a medical procedure. General anesthesia is often recommended when a procedure:  Is long.  Requires you to be still or in  an unusual position.  Is major and can cause you to lose blood.  Is impossible to do without general anesthesia.  The medicines used for general anesthesia are called general anesthetics. In addition to making you sleep, the medicines:  Prevent pain.  Control your blood pressure.  Relax your muscles.  Tell a health care provider about:  Any allergies you have.  All medicines you are taking, including vitamins, herbs, eye drops, creams, and over-the-counter medicines.  Any problems you or family members have had with anesthetic medicines.  Types of anesthetics you have had in the past.  Any bleeding disorders you have.  Any surgeries you have had.  Any medical conditions you have.  Any history of heart or lung conditions, such as heart failure, sleep apnea, or chronic obstructive pulmonary disease (COPD).  Whether you are pregnant or may be pregnant.  Whether you use tobacco, alcohol, marijuana, or street drugs.  Any history of Armed forces logistics/support/administrative officer.  Any history of depression or anxiety. What are the risks? Generally, this is a safe procedure. However, problems may occur, including:  Allergic reaction to anesthetics.  Lung and heart problems.  Inhaling food or liquids from your stomach into your lungs (aspiration).  Injury to nerves.  Waking up during your procedure and being unable to move (rare).  Extreme agitation or a state of mental confusion (delirium) when you wake up from the anesthetic.  Air in the bloodstream, which can lead to stroke.  These problems are more likely to develop if you are having a major surgery or if you have an advanced medical condition. You can prevent some of these complications by answering all of your health care provider's questions thoroughly and by following all pre-procedure instructions. General anesthesia can cause side effects, including:  Nausea or vomiting  A sore throat from the breathing tube.  Feeling cold or  shivery.  Feeling tired, washed out, or achy.  Sleepiness or drowsiness.  Confusion or agitation.  What happens before the procedure? Staying hydrated Follow instructions from your health care provider about hydration, which may include:  Up to 2 hours before the procedure - you may continue to drink clear liquids, such as water, clear fruit juice, black coffee, and plain tea.  Eating and drinking restrictions Follow instructions from your health care provider about eating and drinking, which may include:  8 hours before the procedure - stop eating heavy meals or foods such as meat, fried foods, or fatty foods.  6 hours before the procedure - stop eating light meals or foods, such as toast or cereal.  6 hours before the procedure - stop drinking milk or drinks that contain milk.  2 hours before the procedure - stop drinking clear liquids.  Medicines  Ask your health care provider about: ? Changing or stopping your regular medicines. This is especially important if you are taking diabetes medicines or blood thinners. ? Taking medicines such as aspirin and ibuprofen. These medicines can thin your blood. Do not take these medicines before your procedure if your health care provider instructs you not to. ? Taking new dietary supplements or medicines. Do not take these during the week before your procedure unless your health care provider approves them.  If you are told to take a medicine or to continue taking a medicine on the day of the procedure, take the medicine with sips of water. General instructions   Ask if you will be going home the same day, the following day, or after a longer hospital stay. ? Plan to have someone take you home. ? Plan to have someone stay with you for the first 24 hours after you leave the hospital or clinic.  For 3-6 weeks before the procedure, try not to use any tobacco products, such as cigarettes, chewing tobacco, and e-cigarettes.  You may brush  your teeth on the morning of the procedure, but make sure to spit out the toothpaste. What happens during the procedure?  You will be given anesthetics through a mask and through an IV tube in one of your veins.  You may receive medicine to help you relax (sedative).  As soon as you are asleep, a breathing tube may be used to help you breathe.  An anesthesia specialist will stay with you throughout the procedure. He or she will help keep you comfortable and safe by continuing to give you medicines and adjusting the amount of medicine that you get. He or she will also watch your blood pressure, pulse, and oxygen levels to make sure that the anesthetics do not cause any problems.  If a breathing tube was used to help you breathe, it will be removed before you wake up. The procedure may vary among health care providers and hospitals. What happens after the procedure?  You will wake up, often slowly, after the procedure is complete, usually in a recovery area.  Your blood pressure, heart rate, breathing rate, and blood oxygen level will be monitored until the medicines you were given have worn off.  You may be given medicine to help you calm down if you feel anxious or agitated.  If you will be going home the same day, your health care provider may check to make sure you can stand, drink, and urinate.  Your health care providers will treat your pain and side effects before you go home.  Do not drive for 24 hours if you received a sedative.  You may: ? Feel nauseous and vomit. ? Have a sore throat. ? Have mental slowness. ? Feel cold or shivery. ? Feel sleepy. ? Feel tired. ? Feel sore or achy, even in parts of your body where you did not have surgery. This information is not intended to replace advice given to you by your health care provider. Make sure you discuss any questions you have with your health care provider. Document Released: 11/23/2007 Document Revised: 01/27/2016  Document Reviewed: 07/31/2015 Elsevier Interactive Patient Education  2018 [place] Anesthesia, Adult, Care After These instructions provide you with information about caring for yourself after your procedure. Your health care provider  may also give you more specific instructions. Your treatment has been planned according to current medical practices, but problems sometimes occur. Call your health care provider if you have any problems or questions after your procedure. What can I expect after the procedure? After the procedure, it is common to have:  Vomiting.  A sore throat.  Mental slowness.  It is common to feel:  Nauseous.  Cold or shivery.  Sleepy.  Tired.  Sore or achy, even in parts of your body where you did not have surgery.  Follow these instructions at home: For at least 24 hours after the procedure:  Do not: ? Participate in activities where you could fall or become injured. ? Drive. ? Use heavy machinery. ? Drink alcohol. ? Take sleeping pills or medicines that cause drowsiness. ? Make important decisions or sign legal documents. ? Take care of children on your own.  Rest. Eating and drinking  If you vomit, drink water, juice, or soup when you can drink without vomiting.  Drink enough fluid to keep your urine clear or pale yellow.  Make sure you have little or no nausea before eating solid foods.  Follow the diet recommended by your health care provider. General instructions  Have a responsible adult stay with you until you are awake and alert.  Return to your normal activities as told by your health care provider. Ask your health care provider what activities are safe for you.  Take over-the-counter and prescription medicines only as told by your health care provider.  If you smoke, do not smoke without supervision.  Keep all follow-up visits as told by your health care provider. This is important. Contact a health care provider  if:  You continue to have nausea or vomiting at home, and medicines are not helpful.  You cannot drink fluids or start eating again.  You cannot urinate after 8-12 hours.  You develop a skin rash.  You have fever.  You have increasing redness at the site of your procedure. Get help right away if:  You have difficulty breathing.  You have chest pain.  You have unexpected bleeding.  You feel that you are having a life-threatening or urgent problem. This information is not intended to replace advice given to you by your health care provider. Make sure you discuss any questions you have with your health care provider. Document Released: 11/22/2000 Document Revised: 01/19/2016 Document Reviewed: 07/31/2015 Elsevier Interactive Patient Education  [employer].

## 2018-06-21 ENCOUNTER — Other Ambulatory Visit: Payer: Self-pay

## 2018-06-21 ENCOUNTER — Encounter (HOSPITAL_COMMUNITY): Payer: Self-pay

## 2018-06-21 ENCOUNTER — Encounter (HOSPITAL_COMMUNITY)
Admission: RE | Admit: 2018-06-21 | Discharge: 2018-06-21 | Disposition: A | Discharge status: Home / Self Care | Payer: Medicare HMO | Source: Ambulatory Visit | Attending: Urology | Admitting: Urology

## 2018-06-21 DIAGNOSIS — Z01818 Encounter for other preprocedural examination: Secondary | ICD-10-CM | POA: Insufficient documentation

## 2018-06-21 HISTORY — DX: Unspecified glaucoma: H40.9

## 2018-06-21 LAB — CBC WITH DIFFERENTIAL/PLATELET
Abs Immature Granulocytes: 0.02 10*3/uL (ref 0.00–0.07)
Basophils Absolute: 0.1 10*3/uL (ref 0.0–0.1)
Basophils Relative: 1 %
EOS PCT: 2 %
Eosinophils Absolute: 0.1 10*3/uL (ref 0.0–0.5)
HEMATOCRIT: 53.1 % — AB (ref 39.0–52.0)
HEMOGLOBIN: 17.7 g/dL — AB (ref 13.0–17.0)
Immature Granulocytes: 0 %
LYMPHS ABS: 1.6 10*3/uL (ref 0.7–4.0)
LYMPHS PCT: 18 %
MCH: 35.4 pg — AB (ref 26.0–34.0)
MCHC: 33.3 g/dL (ref 30.0–36.0)
MCV: 106.2 fL — ABNORMAL HIGH (ref 80.0–100.0)
MONO ABS: 0.9 10*3/uL (ref 0.1–1.0)
Monocytes Relative: 10 %
Neutro Abs: 5.8 10*3/uL (ref 1.7–7.7)
Neutrophils Relative %: 69 %
Platelets: 237 10*3/uL (ref 150–400)
RBC: 5 MIL/uL (ref 4.22–5.81)
RDW: 14.6 % (ref 11.5–15.5)
WBC: 8.5 10*3/uL (ref 4.0–10.5)
nRBC: 0 % (ref 0.0–0.2)

## 2018-06-21 LAB — BASIC METABOLIC PANEL
Anion gap: 9 (ref 5–15)
BUN: 7 mg/dL — ABNORMAL LOW (ref 8–23)
CO2: 29 mmol/L (ref 22–32)
CREATININE: 0.6 mg/dL — AB (ref 0.61–1.24)
Calcium: 9.3 mg/dL (ref 8.9–10.3)
Chloride: 97 mmol/L — ABNORMAL LOW (ref 98–111)
GFR calc Af Amer: >60 mL/min (ref >60)
GFR calc non Af Amer: >60 mL/min (ref >60)
GLUCOSE: 91 mg/dL (ref 70–99)
Potassium: 3.7 mmol/L (ref 3.5–5.1)
Sodium: 135 mmol/L (ref 135–145)

## 2018-06-26 NOTE — H&P (Signed)
H&P  Chief Complaint: Difficulty urinating  History of Present Illness:  This 67 year old male presents today for cystoscopy and balloon dilatation of a bulbous urethral stricture.  He has had significant difficulty with urination with obstructive symptomatology.  Recent cystoscopy revealed a severe bulbous urethral stricture.  Past Medical History:  Diagnosis Date  . Cancer ([place]) 2011  . COPD (chronic obstructive pulmonary disease) ([place])   . Glaucoma   . Sleep apnea     Past Surgical History:  Procedure Laterality Date  . PROSTATE SURGERY  2011   seeds implanted/radiation    Home Medications:  Allergies as of 06/26/2018   No Known Allergies     Medication List    Notice   Cannot display discharge medications because the patient has not yet been admitted.     Allergies: No Known Allergies  Family History  Problem Relation Age of Onset  . Alcohol abuse Brother   . COPD Brother     Social History:  reports that he has been smoking cigars and cigarettes. He has a 15.00 pack-year smoking history. He has never used smokeless tobacco. He reports that he drinks about 5.0 standard drinks of alcohol per week. He reports that he does not use drugs.  ROS: A complete review of systems was performed.  All systems are negative except for pertinent findings as noted.  Physical Exam:  Vital signs in last 24 hours:   Constitutional:  Alert and oriented, No acute distress Cardiovascular: Regular rate  Respiratory: Normal respiratory effort GI: Abdomen is soft, nontender, nondistended, no abdominal masses Genitourinary: No CVAT. Normal male phallus, testes are descended bilaterally and non-tender and without masses, scrotum is normal in appearance without lesions or masses, perineum is normal on inspection. Lymphatic: No lymphadenopathy Neurologic: Grossly intact, no focal deficits Psychiatric: Normal mood and affect  Laboratory Data:  No results for input(s): WBC, HGB, HCT,  PLT in the last 72 hours.  No results for input(s): NA, K, CL, GLUCOSE, BUN, CALCIUM, CREATININE in the last 72 hours.  Invalid input(s): CO3   No results found for this or any previous visit (from the past 24 hour(s)). No results found for this or any previous visit (from the past 240 hour(s)).  Renal Function: Recent Labs    06/21/18 1401  CREATININE 0.60*   Estimated Creatinine Clearance: 108.7 mL/min (A) (by C-G formula based on SCr of 0.6 mg/dL (L)).  Radiologic Imaging: No results found.  Impression/Assessment:    Bulbous urethral stricture  Plan:   Cystoscopy, balloon dilatation of bulbous urethral stricture

## 2018-06-27 ENCOUNTER — Ambulatory Visit (HOSPITAL_COMMUNITY): Payer: Medicare HMO

## 2018-06-27 ENCOUNTER — Ambulatory Visit (HOSPITAL_COMMUNITY)
Admission: RE | Admit: 2018-06-27 | Discharge: 2018-06-27 | Disposition: A | Discharge status: Home / Self Care | Payer: Medicare HMO | Source: Ambulatory Visit | Attending: Urology | Admitting: Urology

## 2018-06-27 ENCOUNTER — Encounter (HOSPITAL_COMMUNITY)
Admission: RE | Disposition: A | Discharge status: Home / Self Care | Payer: Self-pay | Source: Ambulatory Visit | Attending: Urology

## 2018-06-27 ENCOUNTER — Ambulatory Visit (HOSPITAL_COMMUNITY): Payer: Medicare HMO | Admitting: Anesthesiology

## 2018-06-27 ENCOUNTER — Encounter (HOSPITAL_COMMUNITY): Payer: Self-pay | Admitting: *Deleted

## 2018-06-27 DIAGNOSIS — F17290 Nicotine dependence, other tobacco product, uncomplicated: Secondary | ICD-10-CM | POA: Diagnosis not present

## 2018-06-27 DIAGNOSIS — I10 Essential (primary) hypertension: Secondary | ICD-10-CM | POA: Insufficient documentation

## 2018-06-27 DIAGNOSIS — I739 Peripheral vascular disease, unspecified: Secondary | ICD-10-CM | POA: Diagnosis not present

## 2018-06-27 DIAGNOSIS — J449 Chronic obstructive pulmonary disease, unspecified: Secondary | ICD-10-CM | POA: Insufficient documentation

## 2018-06-27 DIAGNOSIS — F17210 Nicotine dependence, cigarettes, uncomplicated: Secondary | ICD-10-CM | POA: Insufficient documentation

## 2018-06-27 DIAGNOSIS — N35919 Unspecified urethral stricture, male, unspecified site: Secondary | ICD-10-CM | POA: Diagnosis not present

## 2018-06-27 DIAGNOSIS — G4730 Sleep apnea, unspecified: Secondary | ICD-10-CM | POA: Diagnosis not present

## 2018-06-27 DIAGNOSIS — H409 Unspecified glaucoma: Secondary | ICD-10-CM | POA: Diagnosis not present

## 2018-06-27 DIAGNOSIS — N35912 Unspecified bulbous urethral stricture, male: Secondary | ICD-10-CM | POA: Diagnosis not present

## 2018-06-27 HISTORY — PX: CYSTOSCOPY WITH URETHRAL DILATATION: SHX5125

## 2018-06-27 SURGERY — CYSTOSCOPY, WITH URETHRAL DILATION
Anesthesia: General

## 2018-06-27 MED ORDER — CEPHALEXIN 500 MG PO CAPS: 500.0000 mg | ORAL_CAPSULE | Freq: Two times a day (BID) | ORAL | 0 refills | Status: DC

## 2018-06-27 MED ORDER — LACTATED RINGERS IV SOLN
INTRAVENOUS | Status: DC
  Administered 2018-06-27: 07:00:00 via INTRAVENOUS

## 2018-06-27 MED ORDER — PROPOFOL 10 MG/ML IV BOLUS
INTRAVENOUS | Status: AC
  Filled 2018-06-27: qty 20

## 2018-06-27 MED ORDER — HYDROMORPHONE HCL 1 MG/ML IJ SOLN: 0.2500 mg | INTRAMUSCULAR | Status: DC | PRN

## 2018-06-27 MED ORDER — DIATRIZOATE MEGLUMINE 30 % UR SOLN
URETHRAL | Status: AC
  Filled 2018-06-27: qty 100

## 2018-06-27 MED ORDER — ARTIFICIAL TEARS OPHTHALMIC OINT
TOPICAL_OINTMENT | OPHTHALMIC | Status: AC
  Filled 2018-06-27: qty 7

## 2018-06-27 MED ORDER — FENTANYL CITRATE (PF) 100 MCG/2ML IJ SOLN
INTRAMUSCULAR | Status: AC
  Filled 2018-06-27: qty 2

## 2018-06-27 MED ORDER — MIDAZOLAM HCL 2 MG/2ML IJ SOLN
INTRAMUSCULAR | Status: AC
  Filled 2018-06-27: qty 2

## 2018-06-27 MED ORDER — MIDAZOLAM HCL 5 MG/5ML IJ SOLN
INTRAMUSCULAR | Status: DC | PRN
  Administered 2018-06-27: 1 mg via INTRAVENOUS

## 2018-06-27 MED ORDER — CEFAZOLIN SODIUM-DEXTROSE 2-4 GM/100ML-% IV SOLN
2.0000 g | INTRAVENOUS | Status: AC
  Administered 2018-06-27: 2 g via INTRAVENOUS
  Filled 2018-06-27: qty 100

## 2018-06-27 MED ORDER — HYDROCODONE-ACETAMINOPHEN 7.5-325 MG PO TABS: 1.0000 | ORAL_TABLET | Freq: Once | ORAL | Status: DC | PRN

## 2018-06-27 MED ORDER — MIDAZOLAM HCL 2 MG/2ML IJ SOLN: 0.5000 mg | Freq: Once | INTRAMUSCULAR | Status: DC | PRN

## 2018-06-27 MED ORDER — PROMETHAZINE HCL 25 MG/ML IJ SOLN: 6.2500 mg | INTRAMUSCULAR | Status: DC | PRN

## 2018-06-27 MED ORDER — FENTANYL CITRATE (PF) 100 MCG/2ML IJ SOLN
INTRAMUSCULAR | Status: DC | PRN
  Administered 2018-06-27: 50 ug via INTRAVENOUS
  Administered 2018-06-27: 25 ug via INTRAVENOUS

## 2018-06-27 MED ORDER — STERILE WATER FOR IRRIGATION IR SOLN
Status: DC | PRN
  Administered 2018-06-27: 3000 mL
  Administered 2018-06-27: 500 mL

## 2018-06-27 MED ORDER — LIDOCAINE HCL (CARDIAC) PF 50 MG/5ML IV SOSY
PREFILLED_SYRINGE | INTRAVENOUS | Status: DC | PRN
  Administered 2018-06-27: 40 mg via INTRAVENOUS

## 2018-06-27 MED ORDER — PROPOFOL 10 MG/ML IV BOLUS
INTRAVENOUS | Status: DC | PRN
  Administered 2018-06-27: 250 mg via INTRAVENOUS

## 2018-06-27 MED ORDER — DIATRIZOATE MEGLUMINE 30 % UR SOLN
URETHRAL | Status: DC | PRN
  Administered 2018-06-27: 10 mL via URETHRAL

## 2018-06-27 SURGICAL SUPPLY — 24 items
BAG DRAIN URO TABLE W/ADPT NS (BAG) ×3 IMPLANT
BAG URINE DRAINAGE (UROLOGICAL SUPPLIES) ×3 IMPLANT
BAG URINE LEG 500ML (DRAIN) ×3 IMPLANT
BALLN NEPHROSTOMY (BALLOONS) ×3
BALLOON NEPHROSTOMY (BALLOONS) ×1 IMPLANT
CATH FOLEY 2WAY SLVR  5CC 18FR (CATHETERS)
CATH FOLEY 2WAY SLVR 5CC 18FR (CATHETERS) IMPLANT
CLOTH BEACON ORANGE TIMEOUT ST (SAFETY) ×3 IMPLANT
DECANTER SPIKE VIAL GLASS SM (MISCELLANEOUS) ×3 IMPLANT
GLOVE BIOGEL M 7.0 STRL (GLOVE) ×3 IMPLANT
GLOVE BIOGEL M 8.0 STRL (GLOVE) ×3 IMPLANT
GLOVE BIOGEL PI IND STRL 7.0 (GLOVE) ×2 IMPLANT
GLOVE BIOGEL PI INDICATOR 7.0 (GLOVE) ×4
GOWN STRL REUS W/TWL LRG LVL3 (GOWN DISPOSABLE) ×3 IMPLANT
GOWN STRL REUS W/TWL XL LVL3 (GOWN DISPOSABLE) ×3 IMPLANT
GUIDEWIRE STR DUAL SENSOR (WIRE) ×3 IMPLANT
KIT TURNOVER CYSTO (KITS) ×3 IMPLANT
MANIFOLD NEPTUNE II (INSTRUMENTS) ×3 IMPLANT
NS IRRIG 1000ML POUR BTL (IV SOLUTION) ×3 IMPLANT
PACK CYSTO (CUSTOM PROCEDURE TRAY) ×3 IMPLANT
PAD ARMBOARD 7.5X6 YLW CONV (MISCELLANEOUS) ×3 IMPLANT
TOWEL OR 17X26 4PK STRL BLUE (TOWEL DISPOSABLE) ×3 IMPLANT
WATER STERILE IRR 1000ML POUR (IV SOLUTION) ×3 IMPLANT
WATER STERILE IRR 3000ML UROMA (IV SOLUTION) ×3 IMPLANT

## 2018-06-27 NOTE — Anesthesia Procedure Notes (Signed)
Procedure Name: LMA Insertion Date/Time: 06/27/2018 7:39 AM Performed by: Ollen Bowl, CRNA Pre-anesthesia Checklist: Patient identified, Patient being monitored, Emergency Drugs available, Timeout performed and Suction available Patient Re-evaluated:Patient Re-evaluated prior to induction Oxygen Delivery Method: Circle System Utilized Preoxygenation: Pre-oxygenation with 100% oxygen Induction Type: IV induction Ventilation: Mask ventilation without difficulty LMA: LMA inserted LMA Size: 5.0 Number of attempts: 1 Placement Confirmation: positive ETCO2 and breath sounds checked- equal and bilateral

## 2018-06-27 NOTE — Interval H&P Note (Signed)
History and Physical Interval Note:  06/27/2018 7:18 AM  Steven Howard  has presented today for surgery, with the diagnosis of URETHRAL STRICTURE  The various methods of treatment have been discussed with the patient and family. After consideration of risks, benefits and other options for treatment, the patient has consented to  Procedure(s) with comments: CYSTOSCOPY WITH URETHRAL DILATATION (N/A) - 30 MNS BALLOON DILATION as a surgical intervention .  The patient's history has been reviewed, patient examined, no change in status, stable for surgery.  I have reviewed the patient's chart and labs.  Questions were answered to the patient's satisfaction.     Lillette Boxer Blayne Frankie

## 2018-06-27 NOTE — Transfer of Care (Signed)
Immediate Anesthesia Transfer of Care Note  Patient: Steven Howard  Procedure(s) Performed: CYSTOSCOPY WITH BALLOON DILATATION OF URETHRAL STRICTURE (N/A )  Patient Location: PACU  Anesthesia Type:General  Level of Consciousness: awake  Airway & Oxygen Therapy: Patient Spontanous Breathing  Post-op Assessment: Report given to RN  Post vital signs: Reviewed and stable  Last Vitals:  Vitals Value Taken Time  BP    Temp    Pulse 91 06/27/2018  8:05 AM  Resp 14 06/27/2018  8:05 AM  SpO2 90 % 06/27/2018  8:05 AM  Vitals shown include unvalidated device data.  Last Pain:  Vitals:   06/27/18 0639  TempSrc: Oral  PainSc: 0-No pain      Patients Stated Pain Goal: 7 (35/24/81 8590)  Complications: No apparent anesthesia complications

## 2018-06-27 NOTE — Op Note (Signed)
Preoperative diagnosis: Moderate/severe bulbous urethral stricture  Postoperative diagnosis: Same  Principal procedure: Cystoscopy, balloon dilation of urethral stricture  Surgeon: Clovis Warwick  Anesthesia: General with LMA  Complications: None  Drains: 81 French Foley catheter, to leg bag  Specimen: None  Estimated blood loss: None  Indications: 67 year old male status post brachii therapy several years ago.  He recently presented as a new patient with significant lower urinary tract symptomatology.  Evaluation revealed the patient to have essentially nondetectable PSA, but he had a dense bulbous urethral stricture that could not be passed with the cystoscope.  He presents at this time for balloon dilation of the stricture.  The procedure as well as risks and complications were discussed with the patient.  These include but are not limited to infection, anesthetic complications, bleeding, the need for temporary catheterization, and I also discussed postoperative self-catheterization to keep the stricture open.  He understands the above and desires to proceed.  Description of procedure: The patient was properly identified in the holding area.  He received preoperative IV antibiotics.  Was taken to the operating room where general anesthetic was administered with the LMA.  He was placed in the dorsolithotomy position.  Genitalia and perineum were prepped and draped.  Proper timeout was performed.  62 French panendoscope was advanced under direct vision through the urethra which was normal until the bulbous urethral stricture was encountered.  At this point, sensor tip guidewire was advanced through the stricture and into the bladder using fluoroscopic guidance.  Following this, the guidewire was left in, the scope was removed.  I then negotiated a 24 [place] UroMax nephrostomy balloon over top of the guidewire into the bladder and through the stricture.  At this point the balloon was inflated up to  20 atm of pressure.  It was left inflated for 5 minutes.  Following this, it was deflated and then removed with the guidewire.  The cystoscope was then again passed.  The stricture was wide open and easily admitted the scope.  The bladder was inspected.  There was mild erythema at the bladder neck area with mild fibrinous material layering posteriorly.  No urothelial lesions were noted.  Ureteral orifices were normal.  At this point, the scope was removed.  54 French Foley catheter was removed, balloon inflated and then hooked to dependent drainage/leg bag.  At this point, the procedure was terminated.  The patient was awakened and taken to the PACU in stable condition.  He tolerated the procedure well.

## 2018-06-27 NOTE — Discharge Instructions (Signed)
1. You may see some blood in the urine and may have some burning with urination for 48-72 hours. You also may notice that you have to urinate more frequently or urgently after your procedure which is normal.  2. You should call should you develop an inability urinate, fever > 101, persistent nausea and vomiting that prevents you from eating or drinking to stay hydrated.  3. If you have a stent, you will likely urinate more frequently and urgently until the stent is removed and you may experience some discomfort/pain in the lower abdomen and flank especially when urinating. You may take pain medication prescribed to you if needed for pain. You may also intermittently have blood in the urine until the stent is removed. 4. If you have a catheter, you will be taught how to take care of the catheter by the nursing staff prior to discharge from the hospital.  You may periodically feel a strong urge to void with the catheter in place.  This is a bladder spasm and most often can occur when having a bowel movement or moving around. It is typically self-limited and usually will stop after a few minutes.  You may use some Vaseline or Neosporin around the tip of the catheter to reduce friction at the tip of the penis. You may also see some blood in the urine.  A very small amount of blood can make the urine look quite red.  As long as the catheter is draining well, there usually is not a problem.  However, if the catheter is not draining well and is bloody, you should call the office [phone]) to notify us.  It is okay to remove the catheter on Wednesday morning.  Follow the nurses direction on how to do this.

## 2018-06-27 NOTE — Anesthesia Preprocedure Evaluation (Signed)
Anesthesia Evaluation  Patient identified by MRN, date of birth, ID band Patient awake    Reviewed: Allergy & Precautions, NPO status , Patient's Chart, lab work & pertinent test results  Airway Mallampati: II  TM Distance: >3 FB Neck ROM: Full    Dental no notable dental hx. (+) Teeth Intact   Pulmonary sleep apnea and Continuous Positive Airway Pressure Ventilation , COPD, Current Smoker,  Denies any inhaler use  RA sats 89-92%   Pulmonary exam normal breath sounds clear to auscultation       Cardiovascular Exercise Tolerance: Good hypertension, + Peripheral Vascular Disease and + Orthopnea  Normal cardiovascular examI Rhythm:Regular Rate:Normal     Neuro/Psych Depression negative neurological ROS  negative psych ROS   GI/Hepatic negative GI ROS, Neg liver ROS,   Endo/Other  negative endocrine ROS  Renal/GU negative Renal ROS  negative genitourinary   Musculoskeletal negative musculoskeletal ROS (+)   Abdominal   Peds negative pediatric ROS (+)  Hematology negative hematology ROS (+)   Anesthesia Other Findings   Reproductive/Obstetrics negative OB ROS                             Anesthesia Physical Anesthesia Plan  ASA: II  Anesthesia Plan: General   Post-op Pain Management:    Induction: Intravenous  PONV Risk Score and Plan:   Airway Management Planned: LMA  Additional Equipment:   Intra-op Plan:   Post-operative Plan: Extubation in OR  Informed Consent: I have reviewed the patients History and Physical, chart, labs and discussed the procedure including the risks, benefits and alternatives for the proposed anesthesia with the patient or authorized representative who has indicated his/her understanding and acceptance.   Dental advisory given  Plan Discussed with: CRNA  Anesthesia Plan Comments: (LMA vs ETT as needed )        Anesthesia Quick Evaluation

## 2018-06-27 NOTE — Anesthesia Postprocedure Evaluation (Signed)
Anesthesia Post Note  Patient: Steven Howard  Procedure(s) Performed: CYSTOSCOPY WITH BALLOON DILATATION OF URETHRAL STRICTURE (N/A )  Patient location during evaluation: PACU Anesthesia Type: General Level of consciousness: awake and alert and oriented Pain management: pain level controlled Vital Signs Assessment: post-procedure vital signs reviewed and stable Respiratory status: spontaneous breathing Cardiovascular status: blood pressure returned to baseline and stable Postop Assessment: no apparent nausea or vomiting Anesthetic complications: no     Last Vitals:  Vitals:   06/27/18 0639 06/27/18 0800  BP: (!) 142/98 (P) 125/87  Resp: 18   Temp: 36.9 C   SpO2: 91% (P) 92%    Last Pain:  Vitals:   06/27/18 0800  TempSrc:   PainSc: (P) 5                  [name]

## 2018-06-28 ENCOUNTER — Encounter (HOSPITAL_COMMUNITY): Payer: Self-pay | Admitting: Urology

## 2018-07-04 ENCOUNTER — Ambulatory Visit (INDEPENDENT_AMBULATORY_CARE_PROVIDER_SITE_OTHER): Payer: Medicare HMO | Admitting: Urology

## 2018-07-04 DIAGNOSIS — Z8546 Personal history of malignant neoplasm of prostate: Secondary | ICD-10-CM | POA: Diagnosis not present

## 2018-07-04 DIAGNOSIS — N35011 Post-traumatic bulbous urethral stricture: Secondary | ICD-10-CM | POA: Diagnosis not present

## 2018-07-07 DIAGNOSIS — G4733 Obstructive sleep apnea (adult) (pediatric): Secondary | ICD-10-CM | POA: Diagnosis not present

## 2018-07-07 DIAGNOSIS — G4731 Primary central sleep apnea: Secondary | ICD-10-CM | POA: Diagnosis not present

## 2018-07-11 ENCOUNTER — Ambulatory Visit (INDEPENDENT_AMBULATORY_CARE_PROVIDER_SITE_OTHER): Payer: Medicare HMO | Admitting: Family Medicine

## 2018-07-11 ENCOUNTER — Encounter: Payer: Self-pay | Admitting: Family Medicine

## 2018-07-11 VITALS — BP 124/84 | HR 94 | Temp 97.4°F | Ht 72.0 in | Wt 219.8 lb

## 2018-07-11 DIAGNOSIS — E871 Hypo-osmolality and hyponatremia: Secondary | ICD-10-CM | POA: Diagnosis not present

## 2018-07-11 DIAGNOSIS — I10 Essential (primary) hypertension: Secondary | ICD-10-CM | POA: Diagnosis not present

## 2018-07-11 DIAGNOSIS — J439 Emphysema, unspecified: Secondary | ICD-10-CM

## 2018-07-11 DIAGNOSIS — E782 Mixed hyperlipidemia: Secondary | ICD-10-CM | POA: Diagnosis not present

## 2018-07-11 DIAGNOSIS — I739 Peripheral vascular disease, unspecified: Secondary | ICD-10-CM | POA: Diagnosis not present

## 2018-07-11 DIAGNOSIS — F17210 Nicotine dependence, cigarettes, uncomplicated: Secondary | ICD-10-CM

## 2018-07-11 NOTE — Progress Notes (Signed)
 Subjective:  Patient ID: Steven Howard, male    DOB: 06/17/1951  Age: 67 y.o. MRN: [id number]  CC: Medical Management of Chronic Issues   HPI Steven Howard presents for  follow-up of COPD and hypertension.  He says that the inhaler made him feel levelheaded and days so he discontinued it.  In the process he has discontinued all of his medications and feels that he is doing just fine.  He is up to the bathroom a couple of times a night.  However, he is not short of breath for routine activities.  If he exerts himself excessively he does experience some dyspnea.  He is also in today due to a low sodium noted in the past.  He discontinued his triamterene hydrochlorothiazide.  This was felt to be the most likely cause of the low sodium.  He is due to have that level rechecked. History Steven Howard has a past medical history of Cancer (HCC) (2011), COPD (chronic obstructive pulmonary disease) (HCC), Glaucoma, and Sleep apnea.   He has a past surgical history that includes Prostate surgery (2011) and Cystoscopy with urethral dilatation (N/A, 06/27/2018).   His family history includes Alcohol abuse in his brother; COPD in his brother.He reports that he has been smoking cigars and cigarettes. He has a 15.00 pack-year smoking history. He has never used smokeless tobacco. He reports that he drinks about 5.0 standard drinks of alcohol per week. He reports that he does not use drugs.  Current Outpatient Medications on File Prior to Visit  Medication Sig Dispense Refill  . triamterene-hydrochlorothiazide (MAXZIDE-25) 37.5-25 MG tablet Take 1 tablet by mouth daily. (Patient not taking: Reported on 07/11/2018) 90 tablet 3   No current facility-administered medications on file prior to visit.     ROS Review of Systems  Constitutional: Negative for fever.  Respiratory: Negative for shortness of breath.   Cardiovascular: Negative for chest pain.  Musculoskeletal: Negative for arthralgias.  Skin: Negative for  rash.    Objective:  BP 124/84   Pulse 94   Temp (!) 97.4 F (36.3 C) (Oral)   Ht 6' (1.829 m)   Wt 219 lb 12.8 oz (99.7 kg)   BMI 29.81 kg/m   BP Readings from Last 3 Encounters:  07/11/18 124/84  06/27/18 139/87  06/21/18 (!) 153/85    Wt Readings from Last 3 Encounters:  07/11/18 219 lb 12.8 oz (99.7 kg)  06/21/18 229 lb 8 oz (104.1 kg)  05/30/18 229 lb 9.6 oz (104.1 kg)     Physical Exam  Constitutional: He is oriented to person, place, and time. He appears well-developed and well-nourished.  HENT:  Head: Normocephalic and atraumatic.  Right Ear: External ear normal.  Left Ear: External ear normal.  Mouth/Throat: No oropharyngeal exudate or posterior oropharyngeal erythema.  Eyes: Pupils are equal, round, and reactive to light.  Neck: Normal range of motion. Neck supple.  Cardiovascular: Normal rate and regular rhythm.  No murmur heard. Pulmonary/Chest: Breath sounds normal. No respiratory distress.  Neurological: He is alert and oriented to person, place, and time.  Vitals reviewed.     Assessment & Plan:   Steven Howard was seen today for medical management of chronic issues.  Diagnoses and all orders for this visit:  Essential hypertension -     CBC with Differential/Platelet -     CMP14+EGFR  Pulmonary emphysema, unspecified emphysema type (HCC) -     CBC with Differential/Platelet -     CMP14+EGFR  Cigarette nicotine dependence without   complication -     CBC with Differential/Platelet -     CMP14+EGFR  Hyponatremia   Allergies as of 07/11/2018   No Known Allergies     Medication List        Accurate as of 07/11/18 12:14 PM. Always use your most recent med list.          triamterene-hydrochlorothiazide 37.5-25 MG tablet Commonly known as:  MAXZIDE-25 Take 1 tablet by mouth daily.        Follow-up: Return in about 6 months (around 01/09/2019).  Claretta Fraise, M.D.

## 2018-07-12 ENCOUNTER — Other Ambulatory Visit: Payer: Self-pay | Admitting: Family Medicine

## 2018-07-12 LAB — CBC WITH DIFFERENTIAL/PLATELET
BASOS ABS: 0.1 10*3/uL (ref 0.0–0.2)
Basos: 2 %
EOS (ABSOLUTE): 0.2 10*3/uL (ref 0.0–0.4)
Eos: 3 %
HEMOGLOBIN: 17.8 g/dL — AB (ref 13.0–17.7)
Hematocrit: 51.3 % — ABNORMAL HIGH (ref 37.5–51.0)
Immature Grans (Abs): 0 10*3/uL (ref 0.0–0.1)
Immature Granulocytes: 0 %
Lymphocytes Absolute: 1.8 10*3/uL (ref 0.7–3.1)
Lymphs: 27 %
MCH: 34.8 pg — ABNORMAL HIGH (ref 26.6–33.0)
MCHC: 34.7 g/dL (ref 31.5–35.7)
MCV: 100 fL — ABNORMAL HIGH (ref 79–97)
Monocytes Absolute: 0.8 10*3/uL (ref 0.1–0.9)
Monocytes: 11 %
Neutrophils Absolute: 3.8 10*3/uL (ref 1.4–7.0)
Neutrophils: 57 %
PLATELETS: 279 10*3/uL (ref 150–450)
RBC: 5.11 x10E6/uL (ref 4.14–5.80)
RDW: 12.5 % (ref 12.3–15.4)
WBC: 6.6 10*3/uL (ref 3.4–10.8)

## 2018-07-12 LAB — CMP14+EGFR
ALBUMIN: 3.8 g/dL (ref 3.6–4.8)
ALK PHOS: 89 IU/L (ref 39–117)
ALT: 18 IU/L (ref 0–44)
AST: 28 IU/L (ref 0–40)
Albumin/Globulin Ratio: 1.5 (ref 1.2–2.2)
BILIRUBIN TOTAL: 0.5 mg/dL (ref 0.0–1.2)
BUN/Creatinine Ratio: 10 (ref 10–24)
BUN: 6 mg/dL — AB (ref 8–27)
CHLORIDE: 94 mmol/L — AB (ref 96–106)
CO2: 26 mmol/L (ref 20–29)
Calcium: 9.6 mg/dL (ref 8.6–10.2)
Creatinine, Ser: 0.61 mg/dL — ABNORMAL LOW (ref 0.76–1.27)
GFR calc Af Amer: 118 mL/min/{1.73_m2} (ref >59)
GFR calc non Af Amer: 102 mL/min/{1.73_m2} (ref >59)
GLUCOSE: 74 mg/dL (ref 65–99)
Globulin, Total: 2.6 g/dL (ref 1.5–4.5)
Potassium: 4.4 mmol/L (ref 3.5–5.2)
Sodium: 136 mmol/L (ref 134–144)
Total Protein: 6.4 g/dL (ref 6.0–8.5)

## 2018-07-12 NOTE — Progress Notes (Signed)
Hello Azad,  Your lab result is stable.  Best regards, Claretta Fraise, M.D.

## 2018-08-06 DIAGNOSIS — G4733 Obstructive sleep apnea (adult) (pediatric): Secondary | ICD-10-CM | POA: Diagnosis not present

## 2018-08-15 ENCOUNTER — Encounter: Payer: Self-pay | Admitting: Family Medicine

## 2018-08-15 ENCOUNTER — Encounter: Payer: Self-pay | Admitting: Adult Health

## 2018-08-15 ENCOUNTER — Ambulatory Visit (INDEPENDENT_AMBULATORY_CARE_PROVIDER_SITE_OTHER): Payer: Medicare HMO | Admitting: Family Medicine

## 2018-08-15 VITALS — BP 121/80 | HR 102 | Temp 97.7°F | Ht 72.0 in | Wt 225.0 lb

## 2018-08-15 DIAGNOSIS — J441 Chronic obstructive pulmonary disease with (acute) exacerbation: Secondary | ICD-10-CM

## 2018-08-15 MED ORDER — METHYLPREDNISOLONE ACETATE 80 MG/ML IJ SUSP
80.0000 mg | Freq: Once | INTRAMUSCULAR | Status: AC
  Administered 2018-08-15: 80 mg via INTRAMUSCULAR

## 2018-08-15 MED ORDER — ALBUTEROL SULFATE HFA 108 (90 BASE) MCG/ACT IN AERS: 2.0000 | INHALATION_SPRAY | Freq: Four times a day (QID) | RESPIRATORY_TRACT | 0 refills | Status: DC | PRN

## 2018-08-15 MED ORDER — IPRATROPIUM-ALBUTEROL 0.5-2.5 (3) MG/3ML IN SOLN
3.0000 mL | Freq: Once | RESPIRATORY_TRACT | Status: AC
  Administered 2018-08-15: 3 mL via RESPIRATORY_TRACT

## 2018-08-15 MED ORDER — PREDNISONE 20 MG PO TABS: 40.0000 mg | ORAL_TABLET | Freq: Every day | ORAL | 0 refills | Status: AC

## 2018-08-15 MED ORDER — DOXYCYCLINE HYCLATE 100 MG PO TABS: 100.0000 mg | ORAL_TABLET | Freq: Two times a day (BID) | ORAL | 0 refills | Status: AC

## 2018-08-15 NOTE — Patient Instructions (Signed)
You are having a flare of your COPD.  You are giving a breathing treatment here in office as well as given a dose of steroid through injection today. You have been prescribed the following medications:  Albuterol inhaler: Use 2 puffs every 6 hours for the next 2 days then use it as needed as directed for cough, shortness of breath or wheeze  Prednisone: Take 2 tablets daily for the next 5 days.  Start this tomorrow, 08/16/2018  Doxycycline: Take 1 tablet with a meal twice daily for the next 7 days.  Take your first dose this evening with supper.  STOP SMOKING  Chronic Obstructive Pulmonary Disease Exacerbation Chronic obstructive pulmonary disease (COPD) is a common lung problem. In COPD, the flow of air from the lungs is limited. COPD exacerbations are times that breathing gets worse and you need extra treatment. Without treatment they can be life threatening. If they happen often, your lungs can become more damaged. If your COPD gets worse, your doctor may treat you with:  Medicines.  Oxygen.  Different ways to clear your airway, such as using a mask.  Follow these instructions at home:  Do not smoke.  Avoid tobacco smoke and other things that bother your lungs.  If given, take your antibiotic medicine as told. Finish the medicine even if you start to feel better.  Only take medicines as told by your doctor.  Drink enough fluids to keep your pee (urine) clear or pale yellow (unless your doctor has told you not to).  Use a cool mist machine (vaporizer).  If you use oxygen or a machine that turns liquid medicine into a mist (nebulizer), continue to use them as told.  Keep up with shots (vaccinations) as told by your doctor.  Exercise regularly.  Eat healthy foods.  Keep all doctor visits as told. Get help right away if:  You are very short of breath and it gets worse.  You have trouble talking.  You have bad chest pain.  You have blood in your spit (sputum).  You  have a fever.  You keep throwing up (vomiting).  You feel weak, or you pass out (faint).  You feel confused.  You keep getting worse. This information is not intended to replace advice given to you by your health care provider. Make sure you discuss any questions you have with your health care provider. Document Released: 08/05/2011 Document Revised: 01/22/2016 Document Reviewed: 04/20/2013 Elsevier Interactive Patient Education  2017 [employer].

## 2018-08-15 NOTE — Progress Notes (Signed)
Subjective: CC: URI PCP: Claretta Fraise, MD TXM:IWOEH F Vandyke is a 67 y.o. male presenting to clinic today for:  1.  URI Patient with 2-week history of productive cough.  He notes this started as a head cold and moved to his chest.  He denies any hemoptysis, fevers or chills.  He does report wheeze.  He has known history of COPD but is not on any medications because he has not found them efficacious in the past.  He tried using Alka-Seltzer plus and Robitussin with little improvement in symptoms.   ROS: Per HPI  No Known Allergies Past Medical History:  Diagnosis Date  . Cancer ([place]) 2011  . COPD (chronic obstructive pulmonary disease) ([place])   . Glaucoma   . Sleep apnea    No current outpatient medications on file. Social History   Socioeconomic History  . Marital status: Widowed    Spouse name: Not on file  . Number of children: Not on file  . Years of education: Not on file  . Highest education level: Not on file  Occupational History  . Not on file  Social Needs  . Financial resource strain: Not on file  . Food insecurity:    Worry: Not on file    Inability: Not on file  . Transportation needs:    Medical: Not on file    Non-medical: Not on file  Tobacco Use  . Smoking status: Current Every Day Smoker    Packs/day: 0.50    Years: 30.00    Pack years: 15.00    Types: Cigars, Cigarettes  . Smokeless tobacco: Never Used  Substance and Sexual Activity  . Alcohol use: Yes    Alcohol/week: 5.0 standard drinks    Types: 5 Standard drinks or equivalent per week  . Drug use: Never  . Sexual activity: Not Currently  Lifestyle  . Physical activity:    Days per week: Not on file    Minutes per session: Not on file  . Stress: Not on file  Relationships  . Social connections:    Talks on phone: Not on file    Gets together: Not on file    Attends religious service: Not on file    Active member of club or organization: Not on file    Attends meetings of clubs or  organizations: Not on file    Relationship status: Not on file  . Intimate partner violence:    Fear of current or ex partner: Not on file    Emotionally abused: Not on file    Physically abused: Not on file    Forced sexual activity: Not on file  Other Topics Concern  . Not on file  Social History Narrative  . Not on file   Family History  Problem Relation Age of Onset  . Alcohol abuse Brother   . COPD Brother     Objective: Office vital signs reviewed. BP 121/80   Pulse (!) 102   Temp 97.7 F (36.5 C) (Oral)   Ht 6' (1.829 m)   Wt 225 lb (102.1 kg)   SpO2 93%   BMI 30.52 kg/m   Physical Examination:  General: Awake, alert, well nourished, No acute distress HEENT: Normal    Neck: No masses palpated. No lymphadenopathy    Ears: Tympanic membranes intact, normal light reflex, no erythema, no bulging    Eyes: PERRLA, extraocular membranes intact, sclera wjote    Nose: nasal turbinates moist, clear nasal discharge    Throat:  moist mucus membranes, no erythema, no tonsillar exudate.  Airway is patent Cardio: regular rate and rhythm, S1S2 heard, no murmurs appreciated Pulm: Global expiratory and inspiratory wheezes.air movement fair.  No rhonchi or rales; normal work of breathing on room air   Assessment/ Plan: 66 y.o. male   1. COPD with acute exacerbation [facility]) Clinic consistent with COPD and acute exacerbation.  His oxygen saturation is within normal limits but on the low side of normal.  He was treated with a DuoNeb here in office and lung exam improved with almost total resolution of wheezes and improved air movement.  He was given a dose of Depo-Medrol 80 mg as well.  Prednisone burst prescribed.  Doxycycline prescribed.  Albuterol inhaler prescribed.  Instructions reviewed with the patient and a handout was provided.  Reasons return discussed.  Follow-up PRN. - methylPREDNISolone acetate (DEPO-MEDROL) injection 80 mg   No orders of the defined types were placed in  this encounter.  Meds ordered this encounter  Medications  . methylPREDNISolone acetate (DEPO-MEDROL) injection 80 mg  . predniSONE (DELTASONE) 20 MG tablet    Sig: Take 2 tablets (40 mg total) by mouth daily with breakfast for 5 days.    Dispense:  10 tablet    Refill:  0  . doxycycline (VIBRA-TABS) 100 MG tablet    Sig: Take 1 tablet (100 mg total) by mouth 2 (two) times daily for 7 days.    Dispense:  14 tablet    Refill:  0  . albuterol (PROVENTIL HFA;VENTOLIN HFA) 108 (90 Base) MCG/ACT inhaler    Sig: Inhale 2 puffs into the lungs every 6 (six) hours as needed for wheezing or shortness of breath.    Dispense:  1 Inhaler    Refill:  0  . ipratropium-albuterol (DUONEB) 0.5-2.5 (3) MG/3ML nebulizer solution 3 mL     Janora Norlander, DO [place] [phone]

## 2018-08-31 ENCOUNTER — Ambulatory Visit: Payer: Medicare HMO | Admitting: Adult Health

## 2018-08-31 ENCOUNTER — Encounter: Payer: Self-pay | Admitting: Adult Health

## 2018-08-31 VITALS — BP 139/86 | HR 100 | Ht 72.0 in | Wt 228.4 lb

## 2018-08-31 DIAGNOSIS — J449 Chronic obstructive pulmonary disease, unspecified: Secondary | ICD-10-CM | POA: Diagnosis not present

## 2018-08-31 DIAGNOSIS — G4733 Obstructive sleep apnea (adult) (pediatric): Secondary | ICD-10-CM

## 2018-08-31 NOTE — Progress Notes (Signed)
PATIENT: Steven Howard DOB: November 13, 1950  REASON FOR VISIT: follow up Steven FROM: patient  Steven OF PRESENT ILLNESS: Today 08/31/18:  Steven Howard is a 68 year old male with a Steven of obstructive sleep apnea on BiPAP.  He returns today for follow-up.  His download indicates that he uses machine 25 out of 30 days for compliance of 83%.  He uses machine greater than 4 hours 15 days for compliance of 50%.  On average he uses his machine 4 hours and 14 minutes.  His residual AHI is 9.2 on inspiratory pressure of 24 cm of water and expiratory pressure of 19 cm of water.  His leak in Steven 95th percentile is 85.3 L/min.  Steven patient states that his mask is continuously leaking.  He states that he has tried adjusting Steven straps but Steven mask continues to leak.  Also with Steven original order for [place] 2 L of oxygen was ordered however Steven patient states that he has never been put on oxygen.  He returns today for evaluation.  Steven Howard is a 68 y.o. male patient, seen here in a referral from Dr. Livia Howard. Steven patient [place] almost a year ago after being widowed, has just established himself at Steven new PCP . He had a sleep study in [place], [place] @ [place], [place]. Sleep study took place between Steven years 2008 on [place], and apparently Steven patient was diagnosed with apnea and ordered a BiPAP machine, within 2 years or 3 years of using it there were insurance issues and he finally had to stop it around 8 years ago.  Dr. Livia Howard also stated that Steven patient has a Steven of comp prostate cancer as well as benign prostate hyperplasia and has issues with urinary hesitancy and nocturia, hematuria have been reported.  He has been treated for hypertension and elevated cholesterol but has not been taking any prescription medication.  Has been a smoker for many years used an inhaler in Steven past and has dyspnea on exertion.  According to her previous records COPD  was diagnosed.    Chief complaint according to patient : He has difficulties getting a good night sleep, awakens multiple times and does not feel refreshed and restored Steven following morning.  He feels fatigued and excessively daytime sleepy.   Sleep habits are as follows: Steven patient's dinnertime is around 7 PM, and he will spend Steven evening hours after dinner watching TV, he reports not falling asleep while watching TV in Steven living room.  He will move over to his bedroom around 9:30 PM, his bedroom is cool, not quiet and not dark.  He does not sleep without TV and he sleeps poorly with TV. He sleeps supine on an adjustable bed- 25 degrees. He uses 4 pillows- he almost sits up in bed- orthopnea.   He does not read in bed usually, and he reports that he wakes up so frequently after going to sleep at 10- PM, he will wake up about every 2 hours also, has to go to Steven bathroom, up to 5 times each night- he denies any palpitations, pain, discomfort or shortness of breath. He dreams Sometimes.  No lightheadedness.  He does not wake with headaches no new headaches wake him.  He rises between 8 and 9 AM, waking up spontaneously.  Each bathroom breaks takes him about half an hour before he can go back to sleep, and he estimates that he  will get about 7 hours of sleep out of Steven 12-hour time in bed.  Sleep medical Steven and family sleep Steven:  " Nobody else has apnea".    Social Steven:  Widowed after " 11 years of marriage in [place]" , he must have been 21 at Steven time.  2 grown sons, 2 granddaughters..  Originally from [place] her return to this state  [place] after his wife died. Drinks daily 2-3 beers , and additionally vodka or bourbon at bed time.  Cut way back on smoking to 1/2 ppd, used to smoke 1.5 ppd, she started at age 43. He worked swing shifts for decades. [employer]. Retired in [place].    REVIEW OF SYSTEMS: Out of a complete 14 system review of symptoms, Steven patient complains only of  Steven following symptoms, and all other reviewed systems are negative.  Epworth sleepiness score 6  ALLERGIES: No Known Allergies  HOME MEDICATIONS: Outpatient Medications Prior to Visit  Medication Sig Dispense Refill  . albuterol (PROVENTIL HFA;VENTOLIN HFA) 108 (90 Base) MCG/ACT inhaler Inhale 2 puffs into Steven lungs every 6 (six) hours as needed for wheezing or shortness of breath. 1 Inhaler 0   No facility-administered medications prior to visit.     PAST MEDICAL Steven: Past Medical Steven:  Diagnosis Date  . Cancer ([place]) 12/01/2009  . COPD (chronic obstructive pulmonary disease) ([place])   . Glaucoma   . Sleep apnea     PAST SURGICAL Steven: Past Surgical Steven:  Procedure Laterality Date  . 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 PROSTATE SURGERY  12-01-09   seeds implanted/radiation    FAMILY Steven: Family Steven  Problem Relation Age of Onset  . Alcohol abuse Brother   . COPD Brother     SOCIAL Steven: Social Steven   Socioeconomic Steven  . Marital status: Widowed    Spouse name: Not on file  . Number of children: Not on file  . Years of education: Not on file  . Highest education level: Not on file  Occupational Steven  . Not on file  Social Needs  . Financial resource strain: Not on file  . Food insecurity:    Worry: Not on file    Inability: Not on file  . Transportation needs:    Medical: Not on file    Non-medical: Not on file  Tobacco Use  . Smoking status: Current Every Day Smoker    Packs/day: 0.50    Years: 30.00    Pack years: 15.00    Types: Cigars, Cigarettes  . Smokeless tobacco: Never Used  Substance and Sexual Activity  . Alcohol use: Yes    Alcohol/week: 5.0 standard drinks    Types: 5 Standard drinks or equivalent per week  . Drug use: Never  . Sexual  activity: Not Currently  Lifestyle  . Physical activity:    Days per week: Not on file    Minutes per session: Not on file  . Stress: Not on file  Relationships  . Social connections:    Talks on phone: Not on file    Gets together: Not on file    Attends religious service: Not on file    Active member of club or organization: Not on file    Attends meetings of clubs or organizations: Not on file    Relationship status: Not on  file  . Intimate partner violence:    Fear of current or ex partner: Not on file    Emotionally abused: Not on file    Physically abused: Not on file    Forced sexual activity: Not on file  Other Topics Concern  . Not on file  Social Steven Narrative  . Not on file      PHYSICAL EXAM  Vitals:   08/31/18 1422  BP: 139/86  Pulse: 100  Weight: 228 lb 6.4 oz (103.6 kg)  Height: 6' (1.829 m)   Body mass index is 30.98 kg/m.  Generalized: Well developed, in no acute distress   Neurological examination  Mentation: Alert oriented to time, place, Steven taking. Follows all commands speech and language fluent Cranial nerve II-XII:  Extraocular movements were full, visual field were full on confrontational test. Facial sensation and strength were normal. Uvula tongue midline. Head turning and shoulder shrug  were normal and symmetric.  Mallampati 3+ Motor: Steven motor testing reveals 5 over 5 strength of all 4 extremities. Good symmetric motor tone is noted throughout.  Sensory: Sensory testing is intact to soft touch on all 4 extremities. No evidence of extinction is noted.    Gait and station: Gait is normal.   DIAGNOSTIC DATA (LABS, IMAGING, TESTING) - I reviewed patient records, labs, notes, testing and imaging myself where available.  Lab Results  Component Value Date   WBC 6.6 07/11/2018   HGB 17.8 (H) 07/11/2018   HCT 51.3 (H) 07/11/2018   MCV 100 (H) 07/11/2018   PLT 279 07/11/2018      Component Value Date/Time   NA 136 07/11/2018  1044   K 4.4 07/11/2018 1044   CL 94 (L) 07/11/2018 1044   CO2 26 07/11/2018 1044   GLUCOSE 74 07/11/2018 1044   GLUCOSE 91 06/21/2018 1401   BUN 6 (L) 07/11/2018 1044   CREATININE 0.61 (L) 07/11/2018 1044   CALCIUM 9.6 07/11/2018 1044   PROT 6.4 07/11/2018 1044   ALBUMIN 3.8 07/11/2018 1044   AST 28 07/11/2018 1044   ALT 18 07/11/2018 1044   ALKPHOS 89 07/11/2018 1044   BILITOT 0.5 07/11/2018 1044   GFRNONAA 102 07/11/2018 1044   GFRAA 118 07/11/2018 1044   Lab Results  Component Value Date   CHOL 182 04/18/2018   HDL 67 04/18/2018   LDLCALC 96 04/18/2018   TRIG 95 04/18/2018   CHOLHDL 2.7 04/18/2018   No results found for: HGBA1C Lab Results  Component Value Date   VITAMINB12 1,843 (H) 04/18/2018   Lab Results  Component Value Date   TSH 1.790 04/18/2018      ASSESSMENT AND PLAN 68 y.o. year old male  has a past medical Steven of Cancer ([place]) (2011), COPD (chronic obstructive pulmonary disease) ([place]), Glaucoma, and Sleep apnea. here with:  1.  Obstructive Sleep Apnea on BiPAP   Steven patient's download shows suboptimal compliance with an increase in residual AHI at 9.2.  Steven patient also has a significant leak with his mask.  I will provide an order for mask refitting.  I have also asked Steven DME company to provide 2 L of oxygen as recommended in Steven original order for BiPAP.  Patient is advised that if his symptoms worsen or he develops new symptoms he should let us know.  He will follow-up in 6 months or sooner if needed.  I spent 15 minutes with Steven patient. 50% of this time was spent reviewing [employer],  MSN, NP-C 08/31/2018, 2:35 PM [facility] [address], [place], [place] [address] [phone]

## 2018-08-31 NOTE — Patient Instructions (Addendum)
Your Plan:  Continue using CPAP nightly and greater than 4 hours each night Mask refitting Oxygen ordered in original order. If your symptoms worsen or you develop new symptoms please let us know.   Thank you for coming to see [place] at [facility]. I hope we have been able to provide you high quality care today.  You may receive a patient satisfaction survey over the next few weeks. We would appreciate your feedback and comments so that we may continue to improve ourselves and the health of our patients.

## 2018-08-31 NOTE — Progress Notes (Signed)
Sent community message sent to [employer], re: see order from 04/2018- O 2,  2 L with CPAP.

## 2018-09-04 ENCOUNTER — Other Ambulatory Visit: Payer: Self-pay | Admitting: Neurology

## 2018-09-04 ENCOUNTER — Telehealth: Payer: Self-pay

## 2018-09-04 DIAGNOSIS — R0601 Orthopnea: Secondary | ICD-10-CM

## 2018-09-04 DIAGNOSIS — G4733 Obstructive sleep apnea (adult) (pediatric): Secondary | ICD-10-CM

## 2018-09-04 DIAGNOSIS — J449 Chronic obstructive pulmonary disease, unspecified: Principal | ICD-10-CM

## 2018-09-04 NOTE — Telephone Encounter (Signed)
Order placed and sent to [place] for the ONO to be completed on the patient.

## 2018-09-04 NOTE — Telephone Encounter (Signed)
Patient did not get set up on oxygen back in September. It did not meet criteria but we were not notified of this. James from [place] this today. Can we do an overnight oximetry and if needed he can come back for a bipap titration to add oxygen.

## 2018-09-06 DIAGNOSIS — G4733 Obstructive sleep apnea (adult) (pediatric): Secondary | ICD-10-CM | POA: Diagnosis not present

## 2018-09-12 DIAGNOSIS — R0902 Hypoxemia: Secondary | ICD-10-CM | POA: Diagnosis not present

## 2018-09-12 DIAGNOSIS — J449 Chronic obstructive pulmonary disease, unspecified: Secondary | ICD-10-CM | POA: Diagnosis not present

## 2018-09-21 ENCOUNTER — Ambulatory Visit (INDEPENDENT_AMBULATORY_CARE_PROVIDER_SITE_OTHER): Payer: Medicare HMO | Admitting: Family Medicine

## 2018-09-21 ENCOUNTER — Encounter: Payer: Self-pay | Admitting: Family Medicine

## 2018-09-21 VITALS — BP 105/77 | HR 105 | Temp 97.5°F | Ht 72.0 in | Wt 228.0 lb

## 2018-09-21 DIAGNOSIS — I951 Orthostatic hypotension: Secondary | ICD-10-CM

## 2018-09-21 DIAGNOSIS — R197 Diarrhea, unspecified: Secondary | ICD-10-CM | POA: Diagnosis not present

## 2018-09-21 NOTE — Patient Instructions (Addendum)
Hydration is super important.  Please make sure you are drinking plenty of fluids and eating.  You were offered IV fluids today but you declined.  We discussed wearing compression hose to keep the blood flow more towards the brain so that you are not lightheaded when you stand up.  Make sure that you change positions slowly.  If you continue to have issues over the weekend and if things get worse, please seek immediate medical attention.  If you change your mind about seeing a gastroenterologist for the diarrhea, contact me.   Hypotension As your heart beats, it forces blood through your body. This force is called blood pressure. If you have hypotension, you have low blood pressure. When your blood pressure is too low, you may not get enough blood to your brain or other parts of your body. This may cause you to feel weak, light-headed, have a fast heartbeat, or even pass out (faint). Low blood pressure may be harmless, or it may cause serious problems. What are the causes?  Blood loss.  Not enough water in the body (dehydration).  Heart problems.  Hormone problems.  Pregnancy.  A very bad infection.  Not having enough of certain nutrients.  Very bad allergic reactions.  Certain medicines. What increases the risk?  Age. The risk increases as you get older.  Conditions that affect the heart or the brain and spinal cord (central nervous system).  Taking certain medicines.  Being pregnant. What are the signs or symptoms?  Feeling: ? Weak. ? Light-headed. ? Dizzy. ? Tired (fatigued).  Blurred vision.  Fast heartbeat.  Passing out, in very bad cases. How is this treated?  Changing your diet. This may involve eating more salt (sodium) or drinking more water.  Taking medicines to raise your blood pressure.  Changing how much you take (the dosage) of some of your medicines.  Wearing compression stockings. These stockings help to prevent blood clots and reduce swelling  in your legs. In some cases, you may need to go to the hospital for:  Fluid replacement. This means you will receive fluids through an IV tube.  Blood replacement. This means you will receive donated blood through an IV tube (transfusion).  Treating an infection or heart problems, if this applies.  Monitoring. You may need to be monitored while medicines that you are taking wear off. Follow these instructions at home: Eating and drinking   Drink enough fluids to keep your pee (urine) pale yellow.  Eat a healthy diet. Follow instructions from your doctor about what you can eat or drink. A healthy diet includes: ? Fresh fruits and vegetables. ? Whole grains. ? Low-fat (lean) meats. ? Low-fat dairy products.  Eat extra salt only as told. Do not add extra salt to your diet unless your doctor tells you to.  Eat small meals often.  Avoid standing up quickly after you eat. Medicines  Take over-the-counter and prescription medicines only as told by your doctor. ? Follow instructions from your doctor about changing how much you take of your medicines, if this applies. ? Do not stop or change any of your medicines on your own. General instructions   Wear compression stockings as told by your doctor.  Get up slowly from lying down or sitting.  Avoid hot showers and a lot of heat as told by your doctor.  Return to your normal activities as told by your doctor. Ask what activities are safe for you.  Do not use any products that  contain nicotine or tobacco, such as cigarettes, e-cigarettes, and chewing tobacco. If you need help quitting, ask your doctor.  Keep all follow-up visits as told by your doctor. This is important. Contact a doctor if:  You throw up (vomit).  You have watery poop (diarrhea).  You have a fever for more than 2-3 days.  You feel more thirsty than normal.  You feel weak and tired. Get help right away if:  You have chest pain.  You have a fast or  uneven heartbeat.  You lose feeling (have numbness) in any part of your body.  You cannot move your arms or your legs.  You have trouble talking.  You get sweaty or feel light-headed.  You pass out.  You have trouble breathing.  You have trouble staying awake.  You feel mixed up (confused). Summary  Hypotension is also called low blood pressure. It is when the force of blood pumping through your arteries is too weak.  Hypotension may be harmless, or it may cause serious problems.  Treatment may include changing your diet and medicines, and wearing compression stockings.  In very bad cases, you may need to go to the hospital. This information is not intended to replace advice given to you by your health care provider. Make sure you discuss any questions you have with your health care provider. Document Released: 11/10/2009 Document Revised: 02/09/2018 Document Reviewed: 02/09/2018 Elsevier Interactive Patient Education  [employer].

## 2018-09-21 NOTE — Progress Notes (Signed)
Subjective: CC: Dizziness PCP: Claretta Fraise, MD [id number] Steven [place] is a 68 y.o. male presenting to clinic today for:  1.  Dizziness Patient reports onset of dizziness about a week and 1/2 to 2 weeks ago.  He notes that it is prominent when he gets up in the morning from a seated position.  He also has it intermittently throughout the day.  He denies any ongoing illness but does report that he was treated for COPD exacerbation in December.  He notes total resolution of those symptoms.  He admits to intermittent diarrhea that has been ongoing for about 2 months.  This is nonbloody and occurs at random.  He notes he had about 3 episodes this week.  He does not feel that he has been hydrating as well as he normally does.  He is certainly not on any diuretics or any medications for that matter.  No history of heart failure, renal disease.  No fevers, shortness of breath.  No bleeding from anywhere.  No lower extremity edema.  No losses of consciousness but does admit to 1 fall and goes on to state that it may be result of his left knee having given out.  This is been an issue for him in the past.  ROS: Per HPI  No Known Allergies Past Medical History:  Diagnosis Date  . Cancer ([place]) 2011  . COPD (chronic obstructive pulmonary disease) ([place])   . Glaucoma   . Sleep apnea     Current Outpatient Medications:  .  albuterol (PROVENTIL HFA;VENTOLIN HFA) 108 (90 Base) MCG/ACT inhaler, Inhale 2 puffs into the lungs every 6 (six) hours as needed for wheezing or shortness of breath., Disp: 1 Inhaler, Rfl: 0 Social History   Socioeconomic History  . Marital status: Widowed    Spouse name: Not on file  . Number of children: Not on file  . Years of education: Not on file  . Highest education level: Not on file  Occupational History  . Not on file  Social Needs  . Financial resource strain: Not on file  . Food insecurity:    Worry: Not on file    Inability: Not on file  . Transportation needs:      Medical: Not on file    Non-medical: Not on file  Tobacco Use  . Smoking status: Current Every Day Smoker    Packs/day: 0.50    Years: 30.00    Pack years: 15.00    Types: Cigars, Cigarettes  . Smokeless tobacco: Never Used  Substance and Sexual Activity  . Alcohol use: Yes    Alcohol/week: 5.0 standard drinks    Types: 5 Standard drinks or equivalent per week  . Drug use: Never  . Sexual activity: Not Currently  Lifestyle  . Physical activity:    Days per week: Not on file    Minutes per session: Not on file  . Stress: Not on file  Relationships  . Social connections:    Talks on phone: Not on file    Gets together: Not on file    Attends religious service: Not on file    Active member of club or organization: Not on file    Attends meetings of clubs or organizations: Not on file    Relationship status: Not on file  . Intimate partner violence:    Fear of current or ex partner: Not on file    Emotionally abused: Not on file    Physically abused: Not on file  Forced sexual activity: Not on file  Other Topics Concern  . Not on file  Social History Narrative  . Not on file   Family History  Problem Relation Age of Onset  . Alcohol abuse Brother   . COPD Brother     Objective: Office vital signs reviewed. BP 105/77   Pulse (!) 105   Temp (!) 97.5 F (36.4 C) (Oral)   Ht 6' (1.829 m)   Wt 228 lb (103.4 kg)   SpO2 94%   BMI 30.92 kg/m   Physical Examination:  General: Awake, alert, No acute distress HEENT: Normal, sclera white, MM slightly tacky Cardio: regular rate and rhythm, S1S2 heard, no murmurs appreciated Pulm: clear to auscultation bilaterally, no wheezes, rhonchi or rales; normal work of breathing on room air Extremities: warm, well perfused, No edema, cyanosis or clubbing; +2 pulses bilaterally MSK: arrives in wheelchair/ uses cane at baseline  Orthostatic VS for the past 24 hrs:  BP- Lying Pulse- Lying BP- Sitting Pulse- Sitting BP-  Standing at 0 minutes Pulse- Standing at 0 minutes  09/21/18 1444 98/70 103 (!) 88/64 110 (!) 84/55 103    Assessment/ Plan: 68 y.o. male   1. Orthostatic hypotension Dizziness likely related to orthostatic hypotension.  He is certainly symptomatic with positional changes.  I suspect that this is related to intermittent chronic diarrhea and lack of adequate hydration at home.  We discussed need for adequate oral hydration and I offered him IV fluid bolus here in office but he declined this.  I have also prescribed him compression stockings and advised him to change positions slowly.  A handout was provided to him.  I recommended that he follow-up for recheck next week, particularly if symptoms or not improving with above recommendations.  We discussed reasons for emergent evaluation the emergency department.  Patient was good understanding.  Differential diagnosis include BPPV versus infection (patient asymptomatic from the standpoint) vs adrenal mediated. - Compression stockings  2. Diarrhea, unspecified type I offered him referral for further work-up of this intermittent diarrhea that he has been experiencing over the last couple of months but he declined this today.  He notes his last gastric evaluation was in [place].  He would prefer to go to [place] if he changes his mind.   Orders Placed This Encounter  Procedures  . Compression stockings    Dx: I95.1   No orders of the defined types were placed in this encounter.    Janora Norlander, DO [place] [phone]

## 2018-10-07 DIAGNOSIS — G4733 Obstructive sleep apnea (adult) (pediatric): Secondary | ICD-10-CM | POA: Diagnosis not present

## 2018-10-10 ENCOUNTER — Ambulatory Visit: Payer: Medicare HMO | Admitting: Urology

## 2018-10-10 DIAGNOSIS — Z8546 Personal history of malignant neoplasm of prostate: Secondary | ICD-10-CM

## 2018-10-10 DIAGNOSIS — N35011 Post-traumatic bulbous urethral stricture: Secondary | ICD-10-CM | POA: Diagnosis not present

## 2018-11-01 ENCOUNTER — Ambulatory Visit (INDEPENDENT_AMBULATORY_CARE_PROVIDER_SITE_OTHER): Payer: Medicare HMO

## 2018-11-01 ENCOUNTER — Telehealth: Payer: Self-pay

## 2018-11-01 VITALS — BP 106/65 | HR 114 | Ht 72.0 in | Wt 228.0 lb

## 2018-11-01 DIAGNOSIS — Z0000 Encounter for general adult medical examination without abnormal findings: Secondary | ICD-10-CM

## 2018-11-01 DIAGNOSIS — Z122 Encounter for screening for malignant neoplasm of respiratory organs: Secondary | ICD-10-CM

## 2018-11-01 NOTE — Patient Instructions (Addendum)
GAdvance Directive  Advance directives are legal documents that let you make choices ahead of time about your health care and medical treatment in case you become unable to communicate for yourself. Advance directives are a way for you to communicate your wishes to family, friends, and health care providers. This can help convey your decisions about end-of-life care if you become unable to communicate. Discussing and writing advance directives should happen over time rather than all at once. Advance directives can be changed depending on your situation and what you want, even after you have signed the advance directives. If you do not have an advance directive, some states assign family decision makers to act on your behalf based on how closely you are related to them. Each state has its own laws regarding advance directives. You may want to check with your health care provider, attorney, or state representative about the laws in your state. There are different types of advance directives, such as:  Medical power of attorney.  Living will.  Do not resuscitate (DNR) or do not attempt resuscitation (DNAR) order. Health care proxy and medical power of attorney A health care proxy, also called a health care agent, is a person who is appointed to make medical decisions for you in cases in which you are unable to make the decisions yourself. Generally, people choose someone they know well and trust to represent their preferences. Make sure to ask this person for an agreement to act as your proxy. A proxy may have to exercise judgment in the event of a medical decision for which your wishes are not known. A medical power of attorney is a legal document that names your health care proxy. Depending on the laws in your state, after the document is written, it may also need to be:  Signed.  Notarized.  Dated.  Copied.  Witnessed.  Incorporated into your medical record. You may also want to appoint  someone to manage your financial affairs in a situation in which you are unable to do so. This is called a durable power of attorney for finances. It is a separate legal document from the durable power of attorney for health care. You may choose the same person or someone different from your health care proxy to act as your agent in financial matters. If you do not appoint a proxy, or if there is a concern that the proxy is not acting in your best interests, a court-appointed guardian may be designated to act on your behalf. Living will A living will is a set of instructions documenting your wishes about medical care when you cannot express them yourself. Health care providers should keep a copy of your living will in your medical record. You may want to give a copy to family members or friends. To alert caregivers in case of an emergency, you can place a card in your wallet to let them know that you have a living will and where they can find it. A living will is used if you become:  Terminally ill.  Incapacitated.  Unable to communicate or make decisions. Items to consider in your living will include:  The use or non-use of life-sustaining equipment, such as dialysis machines and breathing machines (ventilators).  A DNR or DNAR order, which is the instruction not to use cardiopulmonary resuscitation (CPR) if breathing or heartbeat stops.  The use or non-use of tube feeding.  Withholding of food and fluids.  Comfort (palliative) care when the goal becomes comfort rather  than a cure.  Organ and tissue donation. A living will does not give instructions for distributing your money and property if you should pass away. It is recommended that you seek the advice of a lawyer when writing a will. Decisions about taxes, beneficiaries, and asset distribution will be legally binding. This process can relieve your family and friends of any concerns surrounding disputes or questions that may come up about  the distribution of your assets. DNR or DNAR A DNR or DNAR order is a request not to have CPR in the event that your heart stops beating or you stop breathing. If a DNR or DNAR order has not been made and shared, a health care provider will try to help any patient whose heart has stopped or who has stopped breathing. If you plan to have surgery, talk with your health care provider about how your DNR or DNAR order will be followed if problems occur. Summary  Advance directives are the legal documents that allow you to make choices ahead of time about your health care and medical treatment in case you become unable to communicate for yourself.  The process of discussing and writing advance directives should happen over time. You can change the advance directives, even after you have signed them.  Advance directives include DNR or DNAR orders, living wills, and designating an agent as your medical power of attorney. This information is not intended to replace advice given to you by your health care provider. Make sure you discuss any questions you have with your health care provider. Document Released: 11/23/2007 Document Revised: 07/05/2016 Document Reviewed: 07/05/2016 Elsevier Interactive Patient Education  2019 [employer].  Steven Howard , Thank you for taking time to come for your Medicare Wellness Visit. I appreciate your ongoing commitment to your health goals. Please review the following plan we discussed and let me know if I can assist you in the future.   These are the goals we discussed: Goals    . Exercise 150 min/wk Moderate Activity    . Have 3 meals a day       This is a list of the screening recommended for you and due dates:  Health Maintenance  Topic Date Due  . Flu Shot  11/29/2018*  . Colon Cancer Screening  05/31/2019*  . Tetanus Vaccine  05/31/2019*  . Pneumonia vaccines (1 of 2 - PCV13) 05/31/2019*  .  Hepatitis C: One time screening is recommended by Center for  Disease Control  (CDC) for  adults born from [birth year] through 1967.   11/01/2019*  *Topic was postponed. The date shown is not the original due date.

## 2018-11-01 NOTE — Telephone Encounter (Signed)
For your review

## 2018-11-01 NOTE — Telephone Encounter (Signed)
Great idea! Please order! Thanks, WS

## 2018-11-01 NOTE — Telephone Encounter (Signed)
For review

## 2018-11-01 NOTE — Telephone Encounter (Signed)
Low dose CT ordered 

## 2018-11-01 NOTE — Progress Notes (Signed)
Subjective:   Steven Howard is a 68 y.o. male who presents for Medicare Annual/Subsequent preventive examination. He currently resides in [place]. He is a widow and lives alone. He has two sons that live nearby and he sees and visits with them often. He is retired and has worked in [employer] during his lifetime. He enjoys gardening and does house work daily. He does have some issues with vertigo and presents today to his visit in a wheelchair because he feels a little unstable. He states that he has episodes of this occasionally. He does not follow any specific exercise routine or diet. He has an outside dog that he said he inherited. He is fairly healthy and quite active.  Review of Systems:   Cardiac Risk Factors include: advanced age (>55men, >18 women);male gender;obesity (BMI >30kg/m2);smoking/ tobacco exposure     Objective:    Vitals: BP 106/65   Pulse (!) 114   Ht 6' (1.829 m)   Wt 228 lb (103.4 kg)   BMI 30.92 kg/m   Body mass index is 30.92 kg/m.  Advanced Directives 11/01/2018 06/21/2018  Does Patient Have a Medical Advance Directive? No No  Would patient like information on creating a medical advance directive? Yes (ED - Information included in AVS) No - Patient declined   Reading materials given today with the AVS  Tobacco Social History   Tobacco Use  Smoking Status Current Every Day Smoker  . Packs/day: 0.50  . Years: 30.00  . Pack years: 15.00  . Types: Cigars, Cigarettes  Smokeless Tobacco Never Used     Patient states that he has cut way back on cigarettes and is trying to quit  Clinical Intake:  Pre-visit preparation completed: No  Pain : No/denies pain     BMI - recorded: 30.92 Nutritional Status: BMI > 30  Obese Nutritional Risks: None Diabetes: No  How often do you need to have someone help you when you read instructions, pamphlets, or other written materials from your doctor or pharmacy?: 1 - Never What is the last grade level you  completed in school?: 12th grade  Interpreter Needed?: No  Information entered by :: Theodoro Clock LPN  Past Medical History:  Diagnosis Date  . Cancer ([place]) 2011  . COPD (chronic obstructive pulmonary disease) ([place])   . Glaucoma   . Sleep apnea    Past Surgical History:  Procedure Laterality Date  . 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 PROSTATE SURGERY  2011   seeds implanted/radiation   Family History  Problem Relation Age of Onset  . Stroke Mother   . Alcohol abuse Brother   . COPD Brother    Social History   Socioeconomic History  . Marital status: Widowed    Spouse name: Not on file  . Number of children: 2  . Years of education: Not on file  . Highest education level: High school graduate  Occupational History  . Occupation: Retired  Scientific laboratory technician  . Financial resource strain: Not hard at all  . Food insecurity:    Worry: Never true    Inability: Never true  . Transportation needs:    Medical: No    Non-medical: No  Tobacco Use  . Smoking status: Current Every Day Smoker    Packs/day: 0.50    Years: 30.00    Pack  years: 15.00    Types: Cigars, Cigarettes  . Smokeless tobacco: Never Used  Substance and Sexual Activity  . Alcohol use: Yes    Alcohol/week: 5.0 standard drinks    Types: 5 Standard drinks or equivalent per week  . Drug use: Never  . Sexual activity: Not Currently  Lifestyle  . Physical activity:    Days per week: 0 days    Minutes per session: 0 min  . Stress: Not at all  Relationships  . Social connections:    Talks on phone: Three times a week    Gets together: Three times a week    Attends religious service: Never    Active member of club or organization: No    Attends meetings of clubs or organizations: Never    Relationship status: Widowed  Other  Topics Concern  . Not on file  Social History Narrative  . Not on file    Outpatient Encounter Medications as of 11/01/2018  Medication Sig  . albuterol (PROVENTIL HFA;VENTOLIN HFA) 108 (90 Base) MCG/ACT inhaler Inhale 2 puffs into the lungs every 6 (six) hours as needed for wheezing or shortness of breath.   No facility-administered encounter medications on file as of 11/01/2018.     Activities of Daily Living In your present state of health, do you have any difficulty performing the following activities: 11/01/2018 06/21/2018  Hearing? 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 Y N  Comment Wears glasses all the time -  Difficulty concentrating or making decisions? N N  Walking or climbing stairs? N Y  Dressing or bathing? N N  Doing errands, shopping? N N  Preparing Food and eating ? N -  Using the Toilet? N -  In the past six months, have you accidently leaked urine? N -  Do you have problems with loss of bowel control? N -  Managing your Medications? N -  Managing your Finances? N -  Housekeeping or managing your Housekeeping? N -  Some recent data might be hidden    Patient Care Team: Claretta Fraise, MD as PCP - General (Family Medicine) Franchot Gallo, MD as Consulting Physician (Urology)   Assessment:   This is a routine wellness examination for [name].  Exercise Activities and Dietary recommendations Current Exercise Habits: The patient does not participate in regular exercise at present, Exercise limited by: respiratory conditions(s)  Goals    . Exercise 150 min/wk Moderate Activity    . Have 3 meals a day       Fall Risk Fall Risk  11/01/2018 08/31/2018 08/15/2018 07/11/2018 05/30/2018  Falls in the past year? 0 0 0 0 No   Is the patient's home free of loose throw rugs in walkways, pet beds, electrical cords, etc?   yes      Grab bars in the bathroom? no      Handrails on the stairs?   yes      Adequate lighting?   yes  Patient does not have  any stairs in the home  Depression Screen PHQ 2/9 Scores 11/01/2018 09/21/2018 08/15/2018 07/11/2018  PHQ - 2 Score 0 0 0 0  PHQ- 9 Score - - 0 -    Cognitive Function MMSE - Mini Mental State Exam 11/01/2018  Orientation to time 5  Orientation to Place 5  Registration 3  Attention/ Calculation 5  Recall 3  Language- name 2 objects 2  Language- repeat 1  Language- follow 3 step command 3  Language- read & follow direction 1  Write a sentence 1  Copy design 1  Total score 30     Patient had no problems completing MMSE     There is no immunization history on file for this patient.  Qualifies for Shingles Vaccine? Patient declines at this time  Screening Tests Health Maintenance  Topic Date Due  . INFLUENZA VACCINE  11/29/2018 (Originally 03/30/2018)  . COLONOSCOPY  05/31/2019 (Originally 03/14/1999)  . TETANUS/TDAP  05/31/2019 (Originally 03/13/1968)  . PNA vac Low Risk Adult (1 of 2 - PCV13) 05/31/2019 (Originally 03/13/2014)  . Hepatitis C Screening  11/01/2019 (Originally [birth year])   Patient does not take flu or pneumonia shots.  Cancer Screenings: Lung: Low Dose CT Chest recommended if Age 11-80 years, 30 pack-year currently smoking OR have quit w/in 15years. Patient does qualify. Colorectal: Patient had done 4-5 years ago and was normal according to him  Additional Screenings:  Hepatitis C Screening: Patient does need a Hep C screen and will be done with next labwork      Plan:     I have personally reviewed and noted the following in the patient's chart:   . Medical and social history . Use of alcohol, tobacco or illicit drugs  . Current medications and supplements . Functional ability and status . Nutritional status . Physical activity . Advanced directives . List of other physicians . Hospitalizations, surgeries, and ER visits in previous 12 months . Vitals . Screenings to include cognitive, depression, and falls . Referrals and appointments  In  addition, I have reviewed and discussed with patient certain preventive protocols, quality metrics, and best practice recommendations. A written personalized care plan for preventive services as well as general preventive health recommendations were provided to patient.     Rolena Infante, LPN  03/03/517

## 2018-11-01 NOTE — Telephone Encounter (Signed)
Patient had a AWV today and looks like he may need a low dose CT of the chest due to current smoking. Can you please review and advise to be sure before ordered. Please route back to me

## 2018-11-03 ENCOUNTER — Ambulatory Visit (INDEPENDENT_AMBULATORY_CARE_PROVIDER_SITE_OTHER): Payer: Medicare HMO | Admitting: Family Medicine

## 2018-11-03 ENCOUNTER — Encounter: Payer: Self-pay | Admitting: Family Medicine

## 2018-11-03 VITALS — BP 88/57 | HR 108 | Temp 97.4°F | Ht 72.0 in | Wt 217.2 lb

## 2018-11-03 DIAGNOSIS — I951 Orthostatic hypotension: Secondary | ICD-10-CM | POA: Diagnosis not present

## 2018-11-03 DIAGNOSIS — I950 Idiopathic hypotension: Secondary | ICD-10-CM | POA: Diagnosis not present

## 2018-11-03 DIAGNOSIS — J439 Emphysema, unspecified: Secondary | ICD-10-CM

## 2018-11-03 DIAGNOSIS — Z1159 Encounter for screening for other viral diseases: Secondary | ICD-10-CM | POA: Diagnosis not present

## 2018-11-03 NOTE — Progress Notes (Signed)
Subjective:  Patient ID: Steven Howard, male    DOB: Dec 04, 1950  Age: 68 y.o. MRN: [id number]  CC: Hypotension (pt here today for follow up of his Orthostatic Hypotension which Dr Darnell Level saw him for)   HPI Steven Howard presents for persistent low blood pressure.  Patient says that when he stands he will feel dizzy and seeing stars.  He says it is like the twilight zone thing.  If he sits down it gets better and goes away usually but sometimes it will persist for a long time perhaps even several hours.  He feels like he might pass out when it occurs.  Patient is not taking any medications at all currently.  He tells me he cannot afford the CT chest low-dose.  He continues to smoke.  Patient is concerned that this might be his liver causing the dizziness.  He mentions that his blood pressure at home tends to run in the 02R systolic in the 42H diastolic.  Depression screen [facility] 2/9 11/03/2018 11/01/2018 09/21/2018  Decreased Interest 0 0 0  Down, Depressed, Hopeless 0 0 0  PHQ - 2 Score 0 0 0  Altered sleeping - - -  Tired, decreased energy - - -  Change in appetite - - -  Feeling bad or failure about yourself  - - -  Trouble concentrating - - -  Moving slowly or fidgety/restless - - -  Suicidal thoughts - - -  PHQ-9 Score - - -  Difficult doing work/chores - - -    History Steven Howard has a past medical history of Cancer ([place]) (2011), COPD (chronic obstructive pulmonary disease) ([place]), Glaucoma, and Sleep apnea.   He has a past surgical history that includes Prostate surgery (2011) and Cystoscopy with urethral dilatation (N/A, 06/27/2018).   His family history includes Alcohol abuse in his brother; COPD in his brother; Stroke in his mother.He reports that he has been smoking cigars and cigarettes. He has a 15.00 pack-year smoking history. He has never used smokeless tobacco. He reports current alcohol use of about 5.0 standard drinks of alcohol per week. He reports that he does not use  drugs.    ROS Review of Systems  Constitutional: Negative.   HENT: Negative.   Eyes: Negative for visual disturbance.  Respiratory: Negative for cough and shortness of breath.   Cardiovascular: Negative for chest pain and leg swelling.  Gastrointestinal: Negative for abdominal pain, diarrhea, nausea and vomiting.  Genitourinary: Negative for difficulty urinating.  Musculoskeletal: Negative for arthralgias and myalgias.  Skin: Negative for rash.  Neurological: Negative for headaches.  Psychiatric/Behavioral: Negative for sleep disturbance.    Objective:  BP (!) 88/57   Pulse (!) 108   Temp (!) 97.4 F (36.3 C) (Oral)   Ht 6' (1.829 m)   Wt 217 lb 4 oz (98.5 kg)   BMI 29.46 kg/m   BP Readings from Last 3 Encounters:  11/03/18 (!) 88/57  11/01/18 106/65  09/21/18 105/77    Wt Readings from Last 3 Encounters:  11/03/18 217 lb 4 oz (98.5 kg)  11/01/18 228 lb (103.4 kg)  09/21/18 228 lb (103.4 kg)     Physical Exam Constitutional:      General: He is not in acute distress.    Appearance: He is well-developed.  HENT:     Head: Normocephalic and atraumatic.     Right Ear: External ear normal.     Left Ear: External ear normal.     Nose: Nose normal.  Eyes:     Conjunctiva/sclera: Conjunctivae normal.     Pupils: Pupils are equal, round, and reactive to light.  Neck:     Musculoskeletal: Normal range of motion and neck supple.  Cardiovascular:     Rate and Rhythm: Normal rate and regular rhythm.     Heart sounds: Normal heart sounds. No murmur.  Pulmonary:     Effort: Pulmonary effort is normal. No respiratory distress.     Breath sounds: Normal breath sounds. No wheezing or rales.  Abdominal:     Palpations: Abdomen is soft.     Tenderness: There is no abdominal tenderness.  Musculoskeletal: Normal range of motion.  Skin:    General: Skin is warm and dry.  Neurological:     Mental Status: He is alert and oriented to person, place, and time.     Deep  Tendon Reflexes: Reflexes are normal and symmetric.  Psychiatric:        Behavior: Behavior normal.        Thought Content: Thought content normal.        Judgment: Judgment normal.       Assessment & Plan:   Steven Howard was seen today for hypotension.  Diagnoses and all orders for this visit:  Pulmonary emphysema, unspecified emphysema type ([place])  Orthostatic hypotension -     CBC with Differential/Platelet -     CMP14+EGFR  Encounter for hepatitis C screening test for low risk patient -     Hepatitis C antibody  Idiopathic hypotension -     Ambulatory referral to Cardiology       I am having Steven Howard maintain his albuterol.  Allergies as of 11/03/2018   No Known Allergies     Medication List       Accurate as of November 03, 2018  5:38 PM. Always use your most recent med list.        albuterol 108 (90 Base) MCG/ACT inhaler Commonly known as:  PROVENTIL HFA;VENTOLIN HFA Inhale 2 puffs into the lungs every 6 (six) hours as needed for wheezing or shortness of breath.        Follow-up: Return in about 1 month (around 12/04/2018).  Claretta Fraise, M.D.

## 2018-11-04 LAB — CMP14+EGFR
A/G RATIO: 1.4 (ref 1.2–2.2)
ALT: 46 IU/L — ABNORMAL HIGH (ref 0–44)
AST: 60 IU/L — ABNORMAL HIGH (ref 0–40)
Albumin: 3.4 g/dL — ABNORMAL LOW (ref 3.8–4.8)
Alkaline Phosphatase: 78 IU/L (ref 39–117)
BUN/Creatinine Ratio: 7 — ABNORMAL LOW (ref 10–24)
BUN: 5 mg/dL — ABNORMAL LOW (ref 8–27)
Bilirubin Total: 0.5 mg/dL (ref 0.0–1.2)
CO2: 27 mmol/L (ref 20–29)
Calcium: 8.9 mg/dL (ref 8.6–10.2)
Chloride: 91 mmol/L — ABNORMAL LOW (ref 96–106)
Creatinine, Ser: 0.72 mg/dL — ABNORMAL LOW (ref 0.76–1.27)
GFR calc Af Amer: 110 mL/min/{1.73_m2} (ref >59)
GFR calc non Af Amer: 95 mL/min/{1.73_m2} (ref >59)
Globulin, Total: 2.4 g/dL (ref 1.5–4.5)
Glucose: 97 mg/dL (ref 65–99)
Potassium: 4.3 mmol/L (ref 3.5–5.2)
SODIUM: 132 mmol/L — AB (ref 134–144)
Total Protein: 5.8 g/dL — ABNORMAL LOW (ref 6.0–8.5)

## 2018-11-04 LAB — CBC WITH DIFFERENTIAL/PLATELET
BASOS ABS: 0.1 10*3/uL (ref 0.0–0.2)
Basos: 1 %
EOS (ABSOLUTE): 0.2 10*3/uL (ref 0.0–0.4)
Eos: 3 %
Hematocrit: 39.4 % (ref 37.5–51.0)
Hemoglobin: 13.6 g/dL (ref 13.0–17.7)
IMMATURE GRANS (ABS): 0 10*3/uL (ref 0.0–0.1)
Immature Granulocytes: 0 %
LYMPHS: 28 %
Lymphocytes Absolute: 2.2 10*3/uL (ref 0.7–3.1)
MCH: 34.6 pg — AB (ref 26.6–33.0)
MCHC: 34.5 g/dL (ref 31.5–35.7)
MCV: 100 fL — ABNORMAL HIGH (ref 79–97)
MONOCYTES: 10 %
Monocytes Absolute: 0.8 10*3/uL (ref 0.1–0.9)
NEUTROS ABS: 4.6 10*3/uL (ref 1.4–7.0)
Neutrophils: 58 %
Platelets: 302 10*3/uL (ref 150–450)
RBC: 3.93 x10E6/uL — ABNORMAL LOW (ref 4.14–5.80)
RDW: 12.5 % (ref 11.6–15.4)
WBC: 7.9 10*3/uL (ref 3.4–10.8)

## 2018-11-04 LAB — HEPATITIS C ANTIBODY: Hep C Virus Ab: <0.1 s/co ratio (ref 0.0–0.9)

## 2018-11-05 DIAGNOSIS — G4733 Obstructive sleep apnea (adult) (pediatric): Secondary | ICD-10-CM | POA: Diagnosis not present

## 2018-11-06 NOTE — Progress Notes (Signed)
Pleas add Hep A & B tests to blood work due to abnormal liver enzymes

## 2018-11-08 ENCOUNTER — Encounter: Payer: Self-pay | Admitting: Cardiovascular Disease

## 2018-11-10 ENCOUNTER — Other Ambulatory Visit: Payer: Medicare HMO

## 2018-11-10 ENCOUNTER — Other Ambulatory Visit: Payer: Self-pay

## 2018-11-10 DIAGNOSIS — R748 Abnormal levels of other serum enzymes: Secondary | ICD-10-CM | POA: Diagnosis not present

## 2018-11-11 LAB — HEPATITIS B SURFACE ANTIBODY,QUALITATIVE: Hep B Surface Ab, Qual: NONREACTIVE

## 2018-11-11 LAB — HEPATITIS A ANTIBODY, IGM: Hep A IgM: NEGATIVE

## 2018-11-16 LAB — HEPATITIS B SURFACE ANTIGEN: Hepatitis B Surface Ag: NEGATIVE

## 2018-11-16 LAB — SPECIMEN STATUS REPORT

## 2018-12-06 DIAGNOSIS — G4733 Obstructive sleep apnea (adult) (pediatric): Secondary | ICD-10-CM | POA: Diagnosis not present

## 2018-12-15 ENCOUNTER — Ambulatory Visit: Payer: Medicare HMO | Admitting: Family Medicine

## 2018-12-18 ENCOUNTER — Ambulatory Visit (INDEPENDENT_AMBULATORY_CARE_PROVIDER_SITE_OTHER): Payer: Medicare HMO | Admitting: Family Medicine

## 2018-12-18 ENCOUNTER — Other Ambulatory Visit: Payer: Self-pay

## 2018-12-18 ENCOUNTER — Encounter: Payer: Self-pay | Admitting: Family Medicine

## 2018-12-18 VITALS — BP 101/61 | HR 61 | Temp 97.1°F | Ht 72.0 in | Wt 211.0 lb

## 2018-12-18 DIAGNOSIS — I959 Hypotension, unspecified: Secondary | ICD-10-CM

## 2018-12-18 DIAGNOSIS — G4739 Other sleep apnea: Secondary | ICD-10-CM | POA: Diagnosis not present

## 2018-12-18 DIAGNOSIS — I951 Orthostatic hypotension: Secondary | ICD-10-CM | POA: Insufficient documentation

## 2018-12-18 DIAGNOSIS — E871 Hypo-osmolality and hyponatremia: Secondary | ICD-10-CM

## 2018-12-18 DIAGNOSIS — R42 Dizziness and giddiness: Secondary | ICD-10-CM | POA: Diagnosis not present

## 2018-12-18 MED ORDER — PREDNISONE 10 MG PO TABS: 10.0000 mg | ORAL_TABLET | Freq: Every day | ORAL | 3 refills | Status: DC

## 2018-12-18 NOTE — Progress Notes (Signed)
Subjective:  Patient ID: Steven Howard, male    DOB: Oct 11, 1950  Age: 68 y.o. MRN: [id number]  CC: decreased BP (1 mo follow up ) and Dizziness   HPI Steven Howard presents for continued problems with dizziness.  It seems to be less severe and less frequent.  It is still related to sitting from laying and standing from sitting positions.  It is still quite bothersome.  He has not fallen or passed out but he has come close to both.  He has an appointment with cardiology for 1 month from today.  He denies any chest pain shortness of breath or palpitations.  Blood work was reviewed from last visit.  He noted that his hepatitis tests were negative and his liver functions were somewhat elevated.  Additionally his sodium was somewhat low.  Having trouble with CPAP mask leaking. 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  Depression screen [facility] 2/9 12/18/2018 11/03/2018 11/01/2018  Decreased Interest 0 0 0  Down, Depressed, Hopeless 0 0 0  PHQ - 2 Score 0 0 0  Altered sleeping - - -  Tired, decreased energy - - -  Change in appetite - - -  Feeling bad or failure about yourself  - - -  Trouble concentrating - - -  Moving slowly or fidgety/restless - - -  Suicidal thoughts - - -  PHQ-9 Score - - -  Difficult doing work/chores - - -    History Steven Howard has a past medical history of Cancer ([place]) (2011), COPD (chronic obstructive pulmonary disease) ([place]), Glaucoma, and Sleep apnea.   He has a past surgical history that includes Prostate surgery (2011) and Cystoscopy with urethral dilatation (N/A, 06/27/2018).   His family history includes Alcohol abuse in his brother; COPD in his brother; Stroke in his mother.He reports that he has been smoking cigars and cigarettes. He has a 15.00 pack-year smoking history. He has never used smokeless tobacco. He reports current alcohol use of about 5.0 standard drinks of alcohol per week. He reports that he does not use drugs.    ROS Review of  Systems  Constitutional: Negative.   HENT: Negative.   Eyes: Negative for visual disturbance.  Respiratory: Negative for cough and shortness of breath.   Cardiovascular: Negative for chest pain and leg swelling.  Gastrointestinal: Negative for abdominal pain, diarrhea, nausea and vomiting.  Genitourinary: Negative for difficulty urinating.  Musculoskeletal: Negative for arthralgias and myalgias.  Skin: Negative for rash.  Neurological: Positive for dizziness. Negative for syncope and headaches.  Psychiatric/Behavioral: Negative for sleep disturbance.    Objective:  BP 101/61 (BP Location: Left Arm)   Pulse 61   Temp (!) 97.1 F (36.2 C) (Oral)   Ht 6' (1.829 m)   Wt 211 lb (95.7 kg)   BMI 28.62 kg/m   BP Readings from Last 3 Encounters:  12/18/18 101/61  11/03/18 (!) 88/57  11/01/18 106/65    Wt Readings from Last 3 Encounters:  12/18/18 211 lb (95.7 kg)  11/03/18 217 lb 4 oz (98.5 kg)  11/01/18 228 lb (103.4 kg)     Physical Exam Vitals signs reviewed.  Constitutional:      Appearance: He is well-developed.  HENT:     Head: Normocephalic and atraumatic.     Right Ear: External ear normal.     Left Ear: External ear normal.     Mouth/Throat:     Pharynx: No oropharyngeal exudate or posterior oropharyngeal erythema.  Eyes:  Pupils: Pupils are equal, round, and reactive to light.  Neck:     Musculoskeletal: Normal range of motion and neck supple.  Cardiovascular:     Rate and Rhythm: Normal rate and regular rhythm.     Heart sounds: No murmur.  Pulmonary:     Effort: No respiratory distress.     Breath sounds: Normal breath sounds.  Neurological:     Mental Status: He is alert and oriented to person, place, and time.       Assessment & Plan:   Kyriakos was seen today for decreased bp and dizziness.  Diagnoses and all orders for this visit:  Hypotension, unspecified hypotension type -     CMP14+EGFR -     Cortisol  Hyponatremia  Dizziness   Other sleep apnea  Other orders -     predniSONE (DELTASONE) 10 MG tablet; Take 1 tablet (10 mg total) by mouth daily with breakfast.   Symptoms point toward adrenal insufficiency  I am having Steven Howard start on predniSONE. I am also having him maintain his albuterol.  Allergies as of 12/18/2018   No Known Allergies     Medication List       Accurate as of December 18, 2018  5:40 PM. Always use your most recent med list.        albuterol 108 (90 Base) MCG/ACT inhaler Commonly known as:  VENTOLIN HFA Inhale 2 puffs into the lungs every 6 (six) hours as needed for wheezing or shortness of breath.   predniSONE 10 MG tablet Commonly known as:  DELTASONE Take 1 tablet (10 mg total) by mouth daily with breakfast.        Follow-up: Return in about 6 weeks (around 01/29/2019).  Claretta Fraise, M.D.

## 2018-12-19 LAB — CMP14+EGFR
ALT: 19 IU/L (ref 0–44)
AST: 42 IU/L — ABNORMAL HIGH (ref 0–40)
Albumin/Globulin Ratio: 1.4 (ref 1.2–2.2)
Albumin: 3.4 g/dL — ABNORMAL LOW (ref 3.8–4.8)
Alkaline Phosphatase: 82 IU/L (ref 39–117)
BUN/Creatinine Ratio: 6 — ABNORMAL LOW (ref 10–24)
BUN: 4 mg/dL — ABNORMAL LOW (ref 8–27)
Bilirubin Total: 0.3 mg/dL (ref 0.0–1.2)
CO2: 27 mmol/L (ref 20–29)
Calcium: 9.1 mg/dL (ref 8.6–10.2)
Chloride: 95 mmol/L — ABNORMAL LOW (ref 96–106)
Creatinine, Ser: 0.67 mg/dL — ABNORMAL LOW (ref 0.76–1.27)
GFR calc Af Amer: 113 mL/min/{1.73_m2} (ref >59)
GFR calc non Af Amer: 98 mL/min/{1.73_m2} (ref >59)
Globulin, Total: 2.5 g/dL (ref 1.5–4.5)
Glucose: 123 mg/dL — ABNORMAL HIGH (ref 65–99)
Potassium: 4.5 mmol/L (ref 3.5–5.2)
Sodium: 141 mmol/L (ref 134–144)
Total Protein: 5.9 g/dL — ABNORMAL LOW (ref 6.0–8.5)

## 2018-12-19 LAB — CORTISOL: Cortisol: 10.5 ug/dL

## 2018-12-20 NOTE — Addendum Note (Signed)
Addended by: Zannie Cove on: 12/20/2018 04:51 PM   Modules accepted: Orders

## 2019-01-02 DIAGNOSIS — G4731 Primary central sleep apnea: Secondary | ICD-10-CM | POA: Diagnosis not present

## 2019-01-02 DIAGNOSIS — G4733 Obstructive sleep apnea (adult) (pediatric): Secondary | ICD-10-CM | POA: Diagnosis not present

## 2019-01-05 DIAGNOSIS — G4733 Obstructive sleep apnea (adult) (pediatric): Secondary | ICD-10-CM | POA: Diagnosis not present

## 2019-01-09 ENCOUNTER — Ambulatory Visit: Payer: Medicare HMO | Admitting: Family Medicine

## 2019-01-11 ENCOUNTER — Telehealth: Payer: Self-pay | Admitting: *Deleted

## 2019-01-11 NOTE — Telephone Encounter (Signed)
YOUR CARDIOLOGY TEAM HAS ARRANGED FOR AN E-VISIT FOR YOUR APPOINTMENT - PLEASE REVIEW IMPORTANT INFORMATION BELOW SEVERAL DAYS PRIOR TO YOUR APPOINTMENT  Due to the recent COVID-19 pandemic, we are transitioning in-person office visits to tele-medicine visits in an effort to decrease unnecessary exposure to our patients, their families, and staff. These visits are billed to your insurance just like a normal visit is. We also encourage you to sign up for MyChart if you have not already done so. You will need a smartphone if possible. For patients that do not have this, we can still complete the visit using a regular telephone but do prefer a smartphone to enable video when possible. You may have a family member that lives with you that can help. If possible, we also ask that you have a blood pressure cuff and scale at home to measure your blood pressure, heart rate and weight prior to your scheduled appointment. Patients with clinical needs that need an in-person evaluation and testing will still be able to come to the office if absolutely necessary. If you have any questions, feel free to call our office.  YOUR PROVIDER WILL BE USING THE FOLLOWING PLATFORM TO COMPLETE YOUR VISIT: [email or web address]  .  [email or web address] -  You will receive a text to join Dr Percival Spanish for your appt. Marland Kitchen  2-3 DAYS BEFORE YOUR APPOINTMENT  You will receive a telephone call from one of our [place] team members - your caller ID may say "Unknown caller." If this is a video visit, we will walk you through how to get the video launched on your phone. We will remind you check your blood pressure, heart rate and weight prior to your scheduled appointment. If you have an Apple Watch or Kardia, please upload any pertinent ECG strips the day before or morning of your appointment to [facility]. Our staff will also make sure you have reviewed the consent and agree to move forward with your scheduled tele-health visit.   THE DAY OF YOUR  APPOINTMENT  Approximately 15 minutes prior to your scheduled appointment, you will receive a telephone call from one of [place] team - your caller ID may say "Unknown caller."  Our staff will confirm medications, vital signs for the day and any symptoms you may be experiencing. Please have this information available prior to the time of visit start. It may also be helpful for you to have a pad of paper and pen handy for any instructions given during your visit. They will also walk you through joining the smartphone meeting if this is a video visit.  CONSENT FOR TELE-HEALTH VISIT - PLEASE REVIEW  I hereby voluntarily request, consent and authorize [facility] and its employed or contracted physicians, physician assistants, nurse practitioners or other licensed health care professionals (the Practitioner), to provide me with telemedicine health care services (the "Services") as deemed necessary by the treating Practitioner. I acknowledge and consent to receive the Services by the Practitioner via telemedicine. I understand that the telemedicine visit will involve communicating with the Practitioner through live audiovisual communication technology and the disclosure of certain medical information by electronic transmission. I acknowledge that I have been given the opportunity to request an in-person assessment or other available alternative prior to the telemedicine visit and am voluntarily participating in the telemedicine visit.  I understand that I have the right to withhold or withdraw my consent to the use of telemedicine in the course of my care at any time, without affecting my right to future care  or treatment, and that the Practitioner or I may terminate the telemedicine visit at any time. I understand that I have the right to inspect all information obtained and/or recorded in the course of the telemedicine visit and may receive copies of available information for a reasonable fee.  I  understand that some of the potential risks of receiving the Services via telemedicine include:  Marland Kitchen Delay or interruption in medical evaluation due to technological equipment failure or disruption; . Information transmitted may not be sufficient (e.g. poor resolution of images) to allow for appropriate medical decision making by the Practitioner; and/or  . In rare instances, security protocols could fail, causing a breach of personal health information.  Furthermore, I acknowledge that it is my responsibility to provide information about my medical history, conditions and care that is complete and accurate to the best of my ability. I acknowledge that Practitioner's advice, recommendations, and/or decision may be based on factors not within their control, such as incomplete or inaccurate data provided by me or distortions of diagnostic images or specimens that may result from electronic transmissions. I understand that the practice of medicine is not an exact science and that Practitioner makes no warranties or guarantees regarding treatment outcomes. I acknowledge that I will receive a copy of this consent concurrently upon execution via email to the email address I last provided but may also request a printed copy by calling the office of [place].    I understand that my insurance will be billed for this visit.   I have read or had this consent read to me. . I understand the contents of this consent, which adequately explains the benefits and risks of the Services being provided via telemedicine.  . I have been provided ample opportunity to ask questions regarding this consent and the Services and have had my questions answered to my satisfaction. . I give my informed consent for the services to be provided through the use of telemedicine in my medical care  By participating in this telemedicine visit I agree to the above. Obtained verbal consent from patient 01/11/2019

## 2019-01-16 NOTE — Progress Notes (Signed)
Virtual Visit via Video Note   This visit type was conducted due to national recommendations for restrictions regarding the COVID-19 Pandemic (e.g. social distancing) in an effort to limit this patient's exposure and mitigate transmission in our community.  Due to his co-morbid illnesses, this patient is at least at moderate risk for complications without adequate follow up.  This format is felt to be most appropriate for this patient at this time.  All issues noted in this document were discussed and addressed.  A limited physical exam was performed with this format.  Please refer to the patient's chart for his consent to telehealth for [facility].   Date:  01/17/2019   ID:  Collene Howard, DOB 23-Jun-1951, MRN [id number]  Patient Location: Home Provider Location: Home  PCP:  Claretta Fraise, MD  Cardiologist:  Minus Breeding Electrophysiologist:  None   Evaluation Performed:  New Patient Evaluation  Chief Complaint:  Dizziness  History of Present Illness:    Steven Howard is a 68 y.o. male who is referred by Claretta Fraise, MD for evaluation of dizziness and near syncope.  He has been evaluated for orthostatic symptoms.  I see that he has had a cortisol level drawn which was normal.  He has been trying to increase his fluid intake.  He describes getting lightheaded anytime he tries to stand up.  Is not had any frank syncope but he feels like he is going to pass out.  He denies any chest pressure, neck or arm discomfort.  Is not describing palpitations, presyncope or syncope.  He smokes cigarettes.  He says he drinks 1 or 2 shots a day.  He has 1 in the morning when he wakes up.    The patient does not have symptoms concerning for COVID-19 infection (fever, chills, cough, or new shortness of breath).    Past Medical History:  Diagnosis Date  . Cancer ([place]) 2011  . COPD (chronic obstructive pulmonary disease) ([place])   . Glaucoma   . Sleep apnea    Past Surgical History:   Procedure Laterality Date  . 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 PROSTATE SURGERY  2011   seeds implanted/radiation     Current Meds  Medication Sig  . albuterol (PROVENTIL HFA;VENTOLIN HFA) 108 (90 Base) MCG/ACT inhaler Inhale 2 puffs into the lungs every 6 (six) hours as needed for wheezing or shortness of breath.     Allergies:   Patient has no known allergies.   Social History   Tobacco Use  . Smoking status: Current Every Day Smoker    Packs/day: 0.50    Years: 30.00    Pack years: 15.00    Types: Cigars, Cigarettes  . Smokeless tobacco: Never Used  Substance Use Topics  . Alcohol use: Yes    Alcohol/week: 5.0 standard drinks    Types: 5 Standard drinks or equivalent per week  . Drug use: Never     Family Hx: The patient's family history includes Alcohol abuse in his brother; COPD in his brother; Stroke in his mother.  ROS:   Please see the history of present illness.    As stated in the HPI and negative for all other systems.   Prior CV studies:   The following studies were reviewed today:    Labs/Other Tests and Data Reviewed:  EKG:  NSR, rate 98, right axis deviation, low voltage limb leads, QTC prolonged, 06/21/2018  Recent Labs: 04/18/2018: TSH 1.790 11/03/2018: Hemoglobin 13.6; Platelets 302 12/18/2018: ALT 19; BUN 4; Creatinine, Ser 0.67; Potassium 4.5; Sodium 141   Recent Lipid Panel Lab Results  Component Value Date/Time   CHOL 182 04/18/2018 02:23 PM   TRIG 95 04/18/2018 02:23 PM   HDL 67 04/18/2018 02:23 PM   CHOLHDL 2.7 04/18/2018 02:23 PM   LDLCALC 96 04/18/2018 02:23 PM    Wt Readings from Last 3 Encounters:  01/17/19 197 lb (89.4 kg)  12/18/18 211 lb (95.7 kg)  11/03/18 217 lb 4 oz (98.5 kg)     Objective:    Vital Signs:  BP 105/70 (BP Location: Left  Arm, Patient Position: Sitting, Cuff Size: Normal)   Pulse (!) 109   Ht 6' (1.829 m)   Wt 197 lb (89.4 kg)   BMI 26.72 kg/m    VITAL SIGNS:  reviewed GEN:  no acute distress CARDIOVASCULAR:  no peripheral edema NEURO:  alert and oriented x 3, no obvious focal deficit PSYCH:  normal affect  ASSESSMENT & PLAN:    HYPOTENSION:   I suspect this is orthostatic.  This is a presumptive diagnosis I told him to increase his salt.  I suggested compression stockings and an abdominal binder and we can see how we can get this to him so that he can try this before his next appointment.  He might ultimately need Florinef.  The etiology of why this would be happening now he was not having for is not clear.  He needs to be seen in the office but would not be able to come to [facility] so we will make an appointment at [facility].  He will need further work-up with EKG and possibly echo.  He will need in office orthostatics.  Of note he is convinced that his blood is too thin because the bleeds easily.  He wanted a blood thickener and I discussed that this is not a therapy.  DIZZINESS:  This is likely related to above.  SLEEP APNEA: He treats this.  SUBSTANCE USE: We will work on this over time discussing salt and alcohol use.  I do note that he was supposed to have a screening CT of his chest because he is a smoker and I think this kind of work-up would be important in this situation.   COVID-19 Education: The signs and symptoms of COVID-19 were discussed with the patient and how to seek care for testing (follow up with PCP or arrange E-visit).  The importance of social distancing was discussed today.  Time:   Today, I have spent 30 minutes with the patient with telehealth technology discussing the above problems.     Medication Adjustments/Labs and Tests Ordered: Current medicines are reviewed at length with the patient today.  Concerns regarding medicines are outlined above.   Tests Ordered: No orders  of the defined types were placed in this encounter.   Medication Changes: No orders of the defined types were placed in this encounter.   Disposition:  Follow up in one month in [place], Minus Breeding, MD  01/17/2019 10:53 AM    [place]

## 2019-01-17 ENCOUNTER — Other Ambulatory Visit: Payer: Self-pay

## 2019-01-17 ENCOUNTER — Encounter: Payer: Self-pay | Admitting: Cardiology

## 2019-01-17 ENCOUNTER — Telehealth (INDEPENDENT_AMBULATORY_CARE_PROVIDER_SITE_OTHER): Payer: Medicare HMO | Admitting: Cardiology

## 2019-01-17 VITALS — BP 105/70 | HR 109 | Ht 72.0 in | Wt 197.0 lb

## 2019-01-17 DIAGNOSIS — Z720 Tobacco use: Secondary | ICD-10-CM

## 2019-01-17 DIAGNOSIS — Z7189 Other specified counseling: Secondary | ICD-10-CM

## 2019-01-17 DIAGNOSIS — I951 Orthostatic hypotension: Secondary | ICD-10-CM

## 2019-01-17 DIAGNOSIS — R42 Dizziness and giddiness: Secondary | ICD-10-CM | POA: Diagnosis not present

## 2019-01-17 MED ORDER — MEDICAL COMPRESSION STOCKINGS MISC: 20.0000 | Freq: Every day | 0 refills | Status: DC

## 2019-01-17 MED ORDER — ABDOMINAL BINDER/ELASTIC MED MISC: 1.0000 | Freq: Every day | 0 refills | Status: DC

## 2019-01-17 NOTE — Patient Instructions (Addendum)
Medication Instructions:  The current medical regimen is effective;  continue present plan and medications.  If you need a refill on your cardiac medications before your next appointment, please call your pharmacy.   Dr Percival Spanish would like for you to obtain an abdominal binder and knee high compression stockings. The stockings should be 20-30 mm/hg strength. Prescriptions for these items have been sent to [facility].  Follow-Up: Follow up in 1 month in the [place] office.  You will be contact to be scheduled for this appointment.  Thank you for choosing [facility]!!

## 2019-01-17 NOTE — Addendum Note (Signed)
Addended by: Shellia Cleverly on: 01/17/2019 04:40 PM   Modules accepted: Orders

## 2019-01-26 ENCOUNTER — Other Ambulatory Visit: Payer: Self-pay

## 2019-01-29 ENCOUNTER — Encounter: Payer: Self-pay | Admitting: Family Medicine

## 2019-01-29 ENCOUNTER — Ambulatory Visit (INDEPENDENT_AMBULATORY_CARE_PROVIDER_SITE_OTHER): Payer: Medicare HMO | Admitting: Family Medicine

## 2019-01-29 ENCOUNTER — Other Ambulatory Visit: Payer: Self-pay

## 2019-01-29 VITALS — BP 105/74 | HR 103 | Temp 97.3°F | Ht 72.0 in | Wt 206.0 lb

## 2019-01-29 DIAGNOSIS — R233 Spontaneous ecchymoses: Secondary | ICD-10-CM

## 2019-01-29 DIAGNOSIS — R238 Other skin changes: Secondary | ICD-10-CM

## 2019-01-29 DIAGNOSIS — I959 Hypotension, unspecified: Secondary | ICD-10-CM | POA: Diagnosis not present

## 2019-01-29 DIAGNOSIS — G903 Multi-system degeneration of the autonomic nervous system: Secondary | ICD-10-CM

## 2019-01-29 LAB — COAGUCHEK XS/INR WAIVED
INR: 1.1 (ref 0.9–1.1)
Prothrombin Time: 12.7 s

## 2019-01-29 MED ORDER — FLUDROCORTISONE ACETATE 0.1 MG PO TABS: 0.1000 mg | ORAL_TABLET | Freq: Every day | ORAL | 2 refills | Status: DC

## 2019-01-29 NOTE — Progress Notes (Signed)
Subjective:  Patient ID: Steven Howard, male    DOB: 1951-04-10  Age: 68 y.o. MRN: [id number]  CC: Hypotension (dizzy - 6 week follow up )   HPI Steven Howard presents for  Continuing problems with getting light headed to the point of passing out . Occurring with moving from seated to standing. Also concerned about easy bleeding.Worried that thin blood is causing the  Dizziness as well. Has   Depression screen [facility] 2/9 01/29/2019 12/18/2018 11/03/2018  Decreased Interest 0 0 0  Down, Depressed, Hopeless 0 0 0  PHQ - 2 Score 0 0 0  Altered sleeping - - -  Tired, decreased energy - - -  Change in appetite - - -  Feeling bad or failure about yourself  - - -  Trouble concentrating - - -  Moving slowly or fidgety/restless - - -  Suicidal thoughts - - -  PHQ-9 Score - - -  Difficult doing work/chores - - -    History Steven Howard has a past medical history of Cancer ([place]) (2011), COPD (chronic obstructive pulmonary disease) ([place]), Glaucoma, and Sleep apnea.   He has a past surgical history that includes Prostate surgery (2011) and Cystoscopy with urethral dilatation (N/A, 06/27/2018).   His family history includes Alcohol abuse in his brother; COPD in his brother; Stroke in his mother.He reports that he has been smoking cigars and cigarettes. He has a 15.00 pack-year smoking history. He has never used smokeless tobacco. He reports current alcohol use of about 5.0 standard drinks of alcohol per week. He reports that he does not use drugs.    ROS Review of Systems  Constitutional: Negative.   HENT: Negative.   Eyes: Negative for visual disturbance.  Respiratory: Negative for cough and shortness of breath.   Cardiovascular: Negative for chest pain and leg swelling.  Gastrointestinal: Negative for abdominal pain, diarrhea, nausea and vomiting.  Genitourinary: Negative for difficulty urinating.  Musculoskeletal: Negative for arthralgias and myalgias.  Skin: Negative for rash.  Neurological:  Positive for dizziness. Negative for headaches.  Hematological: Negative for adenopathy. Bruises/bleeds easily.  Psychiatric/Behavioral: Negative for sleep disturbance.    Objective:  BP 105/74 (BP Location: Left Arm, Cuff Size: Large)   Pulse (!) 103   Temp (!) 97.3 F (36.3 C) (Oral)   Ht 6' (1.829 m)   Wt 206 lb (93.4 kg)   BMI 27.94 kg/m   BP Readings from Last 3 Encounters:  01/29/19 105/74  01/17/19 105/70  12/18/18 101/61    Wt Readings from Last 3 Encounters:  01/29/19 206 lb (93.4 kg)  01/17/19 197 lb (89.4 kg)  12/18/18 211 lb (95.7 kg)     Physical Exam Constitutional:      General: He is not in acute distress.    Appearance: He is well-developed.     Comments: Orthostatics : Laying: 137/90                        Sitting 125/87                        Standing 105/74  HENT:     Head: Normocephalic and atraumatic.     Right Ear: External ear normal.     Left Ear: External ear normal.     Nose: Nose normal.  Eyes:     Conjunctiva/sclera: Conjunctivae normal.     Pupils: Pupils are equal, round, and reactive to light.  Neck:  Musculoskeletal: Normal range of motion and neck supple.  Cardiovascular:     Rate and Rhythm: Normal rate and regular rhythm.     Heart sounds: Normal heart sounds. No murmur.  Pulmonary:     Effort: Pulmonary effort is normal. No respiratory distress.     Breath sounds: Normal breath sounds. No wheezing or rales.  Abdominal:     Palpations: Abdomen is soft.     Tenderness: There is no abdominal tenderness.  Musculoskeletal: Normal range of motion.  Skin:    General: Skin is warm and dry.  Neurological:     Mental Status: He is alert and oriented to person, place, and time.     Deep Tendon Reflexes: Reflexes are normal and symmetric.  Psychiatric:        Behavior: Behavior normal.        Thought Content: Thought content normal.        Judgment: Judgment normal.       Assessment & Plan:   Steven Howard was seen today for  hypotension.  Diagnoses and all orders for this visit:  Neurogenic orthostatic hypotension (HCC) -     CoaguChek XS/INR Waived -     CBC with Differential/Platelet -     EKG 12-Lead  Easy bruising -     CoaguChek XS/INR Waived -     CBC with Differential/Platelet  Other orders -     fludrocortisone (FLORINEF) 0.1 MG tablet; Take 1 tablet (0.1 mg total) by mouth daily.       I have discontinued Steven Howard's predniSONE. I am also having him start on fludrocortisone. Additionally, I am having him maintain his albuterol, Abdominal Binder/Elastic Med, and Medical Compression Stockings.  Allergies as of 01/29/2019   No Known Allergies     Medication List       Accurate as of January 29, 2019 11:59 PM. If you have any questions, ask your nurse or doctor.        STOP taking these medications   predniSONE 10 MG tablet Commonly known as:  DELTASONE Stopped by:  Claretta Fraise, MD     TAKE these medications   Abdominal Binder/Elastic Med Misc 1 Device by Does not apply route daily. Apply in the morning and wear throughout the day.  May remove a bedtime.   Medical Compression Stockings Misc 20-30 each by Other route daily. Knee high compression stockings 20-30 mm/hg Apply every morning, may remove at bedtime.   albuterol 108 (90 Base) MCG/ACT inhaler Commonly known as:  VENTOLIN HFA Inhale 2 puffs into the lungs every 6 (six) hours as needed for wheezing or shortness of breath.   fludrocortisone 0.1 MG tablet Commonly known as:  FLORINEF Take 1 tablet (0.1 mg total) by mouth daily. Started by:  Claretta Fraise, MD        Follow-up: Return in about 1 month (around 02/28/2019).  Claretta Fraise, M.D.

## 2019-01-30 ENCOUNTER — Encounter: Payer: Self-pay | Admitting: Family Medicine

## 2019-01-30 LAB — CBC WITH DIFFERENTIAL/PLATELET
Basophils Absolute: 0.1 10*3/uL (ref 0.0–0.2)
Basos: 1 %
EOS (ABSOLUTE): 0.1 10*3/uL (ref 0.0–0.4)
Eos: 1 %
Hematocrit: 48.9 % (ref 37.5–51.0)
Hemoglobin: 16.6 g/dL (ref 13.0–17.7)
Immature Grans (Abs): 0 10*3/uL (ref 0.0–0.1)
Immature Granulocytes: 0 %
Lymphocytes Absolute: 1.7 10*3/uL (ref 0.7–3.1)
Lymphs: 27 %
MCH: 35.4 pg — ABNORMAL HIGH (ref 26.6–33.0)
MCHC: 33.9 g/dL (ref 31.5–35.7)
MCV: 104 fL — ABNORMAL HIGH (ref 79–97)
Monocytes Absolute: 0.9 10*3/uL (ref 0.1–0.9)
Monocytes: 13 %
Neutrophils Absolute: 3.7 10*3/uL (ref 1.4–7.0)
Neutrophils: 58 %
Platelets: 227 10*3/uL (ref 150–450)
RBC: 4.69 x10E6/uL (ref 4.14–5.80)
RDW: 13.3 % (ref 11.6–15.4)
WBC: 6.4 10*3/uL (ref 3.4–10.8)

## 2019-02-05 DIAGNOSIS — G4733 Obstructive sleep apnea (adult) (pediatric): Secondary | ICD-10-CM | POA: Diagnosis not present

## 2019-02-16 ENCOUNTER — Ambulatory Visit (INDEPENDENT_AMBULATORY_CARE_PROVIDER_SITE_OTHER): Payer: Medicare HMO | Admitting: Physician Assistant

## 2019-02-16 ENCOUNTER — Encounter: Payer: Self-pay | Admitting: Physician Assistant

## 2019-02-16 ENCOUNTER — Other Ambulatory Visit: Payer: Self-pay

## 2019-02-16 VITALS — BP 126/86 | HR 93 | Temp 98.7°F | Ht 72.0 in | Wt 208.0 lb

## 2019-02-16 DIAGNOSIS — I951 Orthostatic hypotension: Secondary | ICD-10-CM | POA: Diagnosis not present

## 2019-02-16 NOTE — Patient Instructions (Signed)
Medication Instructions: Your physician recommends that you continue on your current medications as directed. Please refer to the Current Medication list given to you today.   Labwork: none  Procedures/Testing: none  Follow-Up: 6 months with Dr.Hochrein at the  [facility] office  Any Additional Special Instructions Will Be Listed Below (If Applicable).   Increase salt and water intake   Call for symptoms of dizziness  If you need a refill on your cardiac medications before your next appointment, please call your pharmacy.

## 2019-02-16 NOTE — Progress Notes (Signed)
Cardiology Office Note   Date:  02/16/2019   ID:  Steven Howard, DOB 08/04/1951, MRN [id number]  PCP:  Claretta Fraise, MD Cardiologist:  Minus Breeding, MD 01/17/2019 Rosaria Ferries, PA-C   No chief complaint on file.   History of Present Illness: Steven Howard is a 68 y.o. male with a history of COPD, prostate CA, glaucoma, OSA, tob use  05/20 Tele visit, new pt eval for orthostatic dizziness and near-syncope, nl cortisol level, drinks 1-2 shots/day, starts in am. Increased salt intake, compression stockings and abd binder ordered, may need Florinef. Needs ECG and echo plus orthostatic VS, get CT chest due to tob use and ETOH  Collene Howard presents for cardiology follow up.  He feels much better. He quit hard liquor, drinks 2-3 beers/day.   He never got the compression stockings or abdominal binder.  He picked up the Florinef, but quit taking it.  He will occasionally get a little dizzy when he first gets out of bed, but not bad.   He has increased his salt intake. No problems with edema. He is drinking more water, some Gatorade. He is drinking at least a [place] of water a day.   Does not get SOB with exertion. Does not chest pain.  No lower extremity edema, no orthopnea or PND.  His heart skips once in a while.  He has had this for years.  The skips or single skips and do not cause any symptoms.  He had rheumatic fever as a child.    Past Medical History:  Diagnosis Date  . Cancer [facility]) 2011   Prostate  . COPD (chronic obstructive pulmonary disease) ([place])   . Glaucoma   . Sleep apnea     Past Surgical History:  Procedure Laterality Date  . 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 PROSTATE SURGERY  2011   seeds implanted/radiation    Current Outpatient Medications  Medication Sig  Dispense Refill  . albuterol (PROVENTIL HFA;VENTOLIN HFA) 108 (90 Base) MCG/ACT inhaler Inhale 2 puffs into the lungs every 6 (six) hours as needed for wheezing or shortness of breath. 1 Inhaler 0  . Elastic Bandages & Supports (ABDOMINAL BINDER/ELASTIC MED) MISC 1 Device by Does not apply route daily. Apply in the morning and wear throughout the day.  May remove a bedtime. 1 each 0  . Elastic Bandages & Supports (MEDICAL COMPRESSION STOCKINGS) MISC 20-30 each by Other route daily. Knee high compression stockings 20-30 mm/hg Apply every morning, may remove at bedtime. 2 each 0   No current facility-administered medications for this visit.     Allergies:   Patient has no known allergies.    Social History:  The patient  reports that he has been smoking cigars and cigarettes. He has a 15.00 pack-year smoking history. He has never used smokeless tobacco. He reports current alcohol use of about 5.0 standard drinks of alcohol per week. He reports that he does not use drugs.   Family History:  The patient's family history includes Alcohol abuse in his brother; COPD in his brother; Stroke in his mother.  He indicated that his mother is deceased. He indicated that his father is deceased. He indicated that both of his sisters are alive. He indicated that two of his three brothers are alive. He indicated that his maternal grandmother is  deceased. He indicated that his maternal grandfather is deceased. He indicated that his paternal grandmother is deceased. He indicated that his paternal grandfather is deceased. He indicated that both of his sons are alive.    ROS:  Please see the history of present illness. All other systems are reviewed and negative.   Orthostatic VS Position BP HR  Lying 129/86 88  Sitting 116/80 89  Standing 120/81 97  Standing at 3"            PHYSICAL EXAM: VS:  BP 126/86 (BP Location: Left Arm)   Pulse 93   Temp 98.7 F (37.1 C)   Ht 6' (1.829 m)   Wt 208 lb (94.3  kg)   SpO2 96%   BMI 28.21 kg/m  , BMI Body mass index is 28.21 kg/m. GEN: Well nourished, well developed, male in no acute distress HEENT: normal for age  Neck: no JVD, no carotid bruit, no masses Cardiac: RRR, occasional skipped beats heard; no murmur, no rubs, or gallops Respiratory:  clear to auscultation bilaterally, normal work of breathing GI: soft, nontender, nondistended, + BS MS: no deformity or atrophy; no edema; distal pulses are 2+ in all 4 extremities  Skin: warm and dry, no rash Neuro:  Strength and sensation are intact Psych: euthymic mood, full affect   EKG:  EKG is not ordered today.  Recent Labs: 04/18/2018: TSH 1.790 12/18/2018: ALT 19; BUN 4; Creatinine, Ser 0.67; Potassium 4.5; Sodium 141 01/29/2019: Hemoglobin 16.6; Platelets 227  CBC    Component Value Date/Time   WBC 6.4 01/29/2019 1455   WBC 8.5 06/21/2018 1401   RBC 4.69 01/29/2019 1455   RBC 5.00 06/21/2018 1401   HGB 16.6 01/29/2019 1455   HCT 48.9 01/29/2019 1455   PLT 227 01/29/2019 1455   MCV 104 (H) 01/29/2019 1455   MCH 35.4 (H) 01/29/2019 1455   MCH 35.4 (H) 06/21/2018 1401   MCHC 33.9 01/29/2019 1455   MCHC 33.3 06/21/2018 1401   RDW 13.3 01/29/2019 1455   LYMPHSABS 1.7 01/29/2019 1455   MONOABS 0.9 06/21/2018 1401   EOSABS 0.1 01/29/2019 1455   BASOSABS 0.1 01/29/2019 1455   CMP Latest Ref Rng & Units 12/18/2018 11/03/2018 07/11/2018  Glucose 65 - 99 mg/dL 123(H) 97 74  BUN 8 - 27 mg/dL 4(L) 5(L) 6(L)  Creatinine 0.76 - 1.27 mg/dL 0.67(L) 0.72(L) 0.61(L)  Sodium 134 - 144 mmol/L 141 132(L) 136  Potassium 3.5 - 5.2 mmol/L 4.5 4.3 4.4  Chloride 96 - 106 mmol/L 95(L) 91(L) 94(L)  CO2 20 - 29 mmol/L 27 27 26   Calcium 8.6 - 10.2 mg/dL 9.1 8.9 9.6  Total Protein 6.0 - 8.5 g/dL 5.9(L) 5.8(L) 6.4  Total Bilirubin 0.0 - 1.2 mg/dL 0.3 0.5 0.5  Alkaline Phos 39 - 117 IU/L 82 78 89  AST 0 - 40 IU/L 42(H) 60(H) 28  ALT 0 - 44 IU/L 19 46(H) 18     Lipid Panel Lab Results  Component Value  Date   CHOL 182 04/18/2018   HDL 67 04/18/2018   LDLCALC 96 04/18/2018   TRIG 95 04/18/2018   CHOLHDL 2.7 04/18/2018      Wt Readings from Last 3 Encounters:  02/16/19 208 lb (94.3 kg)  01/29/19 206 lb (93.4 kg)  01/17/19 197 lb (89.4 kg)     Other studies Reviewed: Additional studies/ records that were reviewed today include: Office notes and testing.  ASSESSMENT AND PLAN:  1.  Orthostatic hypotension: -See the orthostatic vital  signs above.  He was mildly positive and got minimally lightheaded when he stood up, but this passed very quickly.  I explained that his lifestyle changes have really help the orthostatic hypotension.  He is encouraged to continue these. -Continue to drink even more water. - Continue to limit alcohol - Continue to use salt liberally as long as he is not having problems with edema. -Contact us if he has any additional symptoms.   -He prefers not to do any testing at this time.  He will contact us if he decides to pursue any testing or has more symptoms.   Current medicines are reviewed at length with the patient today.  The patient does not have concerns regarding medicines.  The following changes have been made:  no change  Labs/ tests ordered today include:  No orders of the defined types were placed in this encounter.    Disposition:   FU with Minus Breeding, MD  Signed, Rosaria Ferries, PA-C  02/16/2019 4:09 PM    [place] Phone: [phone]; Fax: [phone]

## 2019-02-26 ENCOUNTER — Encounter: Payer: Self-pay | Admitting: Family Medicine

## 2019-02-26 ENCOUNTER — Ambulatory Visit (INDEPENDENT_AMBULATORY_CARE_PROVIDER_SITE_OTHER): Payer: Medicare HMO | Admitting: Family Medicine

## 2019-02-26 ENCOUNTER — Other Ambulatory Visit: Payer: Self-pay

## 2019-02-26 DIAGNOSIS — F1010 Alcohol abuse, uncomplicated: Secondary | ICD-10-CM

## 2019-02-26 DIAGNOSIS — I951 Orthostatic hypotension: Secondary | ICD-10-CM

## 2019-02-26 MED ORDER — MIDODRINE HCL 10 MG PO TABS: 10.0000 mg | ORAL_TABLET | Freq: Three times a day (TID) | ORAL | 2 refills | Status: DC

## 2019-02-26 NOTE — Progress Notes (Signed)
Subjective:    Patient ID: Steven Howard, male    DOB: [birth year], 68 y.o.   MRN: [id number]   HPI: Steven Howard is a 68 y.o. male presenting for recheck of use of fludrocortisone for hypotension. DCed due to diarrhea occurring. BP is up and down. Admits to drinking a lot of hard liquor. Went cold [place]. Started feeling really bad, so now down to three beers a day. Cardiologist ordered abd. Wrap and support stockings. Very uncomfortable. Caused a lot of swelling.  Pt. Emotes regarding not seeing the M.D. insists on seeing the doctor, not a P.A. "I got pissed."  Feels P.A. became defensive.    Depression screen [facility] 2/9 01/29/2019 12/18/2018 11/03/2018 11/01/2018 09/21/2018  Decreased Interest 0 0 0 0 0  Down, Depressed, Hopeless 0 0 0 0 0  PHQ - 2 Score 0 0 0 0 0  Altered sleeping - - - - -  Tired, decreased energy - - - - -  Change in appetite - - - - -  Feeling bad or failure about yourself  - - - - -  Trouble concentrating - - - - -  Moving slowly or fidgety/restless - - - - -  Suicidal thoughts - - - - -  PHQ-9 Score - - - - -  Difficult doing work/chores - - - - -     Relevant past medical, surgical, family and social history reviewed and updated as indicated.  Interim medical history since our last visit reviewed. Allergies and medications reviewed and updated.  ROS:  Review of Systems  Constitutional: Negative for fever.  Respiratory: Negative for shortness of breath.   Cardiovascular: Positive for leg swelling. Negative for chest pain.  Musculoskeletal: Negative for arthralgias.  Skin: Negative for rash.     Social History   Tobacco Use  Smoking Status Current Every Day Smoker  . Packs/day: 0.50  . Years: 30.00  . Pack years: 15.00  . Types: Cigars, Cigarettes  Smokeless Tobacco Never Used       Objective:     Wt Readings from Last 3 Encounters:  02/16/19 208 lb (94.3 kg)  01/29/19 206 lb (93.4 kg)  01/17/19 197 lb (89.4 kg)     Exam deferred. Pt.  Har[place]ing due to COVID 19. Phone visit performed.   Assessment & Plan:   1. Orthostatic hypotension   2. Alcohol abuse     Meds ordered this encounter  Medications  . midodrine (PROAMATINE) 10 MG tablet    Sig: Take 1 tablet (10 mg total) by mouth 3 (three) times daily.    Dispense:  90 tablet    Refill:  2    No orders of the defined types were placed in this encounter.     Diagnoses and all orders for this visit:  Orthostatic hypotension  Alcohol abuse  Other orders -     midodrine (PROAMATINE) 10 MG tablet; Take 1 tablet (10 mg total) by mouth 3 (three) times daily.  Continue to taper off all alcohol.  Virtual Visit via telephone Note  I discussed the limitations, risks, security and privacy concerns of performing an evaluation and management service by telephone and the availability of in person appointments. The patient was identified with two identifiers. Pt.expressed understanding and agreed to proceed. Pt. Is at home. Dr. Livia Snellen is in his office.  Follow Up Instructions:   I discussed the assessment and treatment plan with the patient. The patient was provided an opportunity to ask  questions and all were answered. The patient agreed with the plan and demonstrated an understanding of the instructions.   The patient was advised to call back or seek an in-person evaluation if the symptoms worsen or if the condition fails to improve as anticipated.   Total minutes including chart review and phone contact time: 20   Follow up plan: Return in about 6 weeks (around 04/09/2019).  Claretta Fraise, MD [place]

## 2019-03-07 DIAGNOSIS — G4733 Obstructive sleep apnea (adult) (pediatric): Secondary | ICD-10-CM | POA: Diagnosis not present

## 2019-03-13 ENCOUNTER — Other Ambulatory Visit: Payer: Self-pay

## 2019-03-13 ENCOUNTER — Encounter: Payer: Self-pay | Admitting: Family Medicine

## 2019-03-13 ENCOUNTER — Ambulatory Visit (INDEPENDENT_AMBULATORY_CARE_PROVIDER_SITE_OTHER): Payer: Medicare HMO | Admitting: Family Medicine

## 2019-03-13 DIAGNOSIS — H8303 Labyrinthitis, bilateral: Secondary | ICD-10-CM

## 2019-03-13 MED ORDER — PREDNISONE 10 MG PO TABS: ORAL_TABLET | ORAL | 0 refills | Status: DC

## 2019-03-13 MED ORDER — MECLIZINE HCL 25 MG PO TABS: 25.0000 mg | ORAL_TABLET | Freq: Four times a day (QID) | ORAL | 0 refills | Status: DC | PRN

## 2019-03-13 NOTE — Progress Notes (Signed)
    Subjective:    Patient ID: Steven Howard, male    DOB: Jan 19, 1951, 68 y.o.   MRN: [id number]   HPI: Steven Howard is a 68 y.o. male presenting for vertigo. Continuous room spinning for 2 days. Nauseated. Pressure on the ears. Worse with moving his head. Took a big dose of epsom salt yesterday. Taking midodrine due to orthostasis.  This sensation is entirely different.  Patient denies any fever chills or sweats.  No cough no shortness of breath.   Depression screen [facility] 2/9 01/29/2019 12/18/2018 11/03/2018 11/01/2018 09/21/2018  Decreased Interest 0 0 0 0 0  Down, Depressed, Hopeless 0 0 0 0 0  PHQ - 2 Score 0 0 0 0 0  Altered sleeping - - - - -  Tired, decreased energy - - - - -  Change in appetite - - - - -  Feeling bad or failure about yourself  - - - - -  Trouble concentrating - - - - -  Moving slowly or fidgety/restless - - - - -  Suicidal thoughts - - - - -  PHQ-9 Score - - - - -  Difficult doing work/chores - - - - -     Relevant past medical, surgical, family and social history reviewed and updated as indicated.  Interim medical history since our last visit reviewed. Allergies and medications reviewed and updated.  ROS:  Review of Systems  Constitutional: Negative for fever.  Respiratory: Negative for shortness of breath.   Cardiovascular: Negative for chest pain.  Musculoskeletal: Negative for arthralgias.  Skin: Negative for rash.  Neurological: Positive for dizziness. Negative for syncope, weakness and numbness.     Social History   Tobacco Use  Smoking Status Current Every Day Smoker  . Packs/day: 0.50  . Years: 30.00  . Pack years: 15.00  . Types: Cigars, Cigarettes  Smokeless Tobacco Never Used       Objective:     Wt Readings from Last 3 Encounters:  02/16/19 208 lb (94.3 kg)  01/29/19 206 lb (93.4 kg)  01/17/19 197 lb (89.4 kg)     Exam deferred. Pt. Harboring due to COVID 19. Phone visit performed.   Assessment & Plan:  No diagnosis found.  No orders of the defined types were placed in this encounter.   No orders of the defined types were placed in this encounter.     There are no diagnoses linked to this encounter.  Virtual Visit via telephone Note  I discussed the limitations, risks, security and privacy concerns of performing an evaluation and management service by telephone and the availability of in person appointments. The patient was identified with two identifiers. Pt.expressed understanding and agreed to proceed. Pt. Is at home. Dr. Livia Snellen is in his office.  Follow Up Instructions:   I discussed the assessment and treatment plan with the patient. The patient was provided an opportunity to ask questions and all were answered. The patient agreed with the plan and demonstrated an understanding of the instructions.   The patient was advised to call back or seek an in-person evaluation if the symptoms worsen or if the condition fails to improve as anticipated.   Total minutes including chart review and phone contact time: 18   Follow up plan: No follow-ups on file.  Claretta Fraise, MD [place]

## 2019-03-26 ENCOUNTER — Telehealth: Payer: Self-pay | Admitting: Family Medicine

## 2019-03-26 NOTE — Chronic Care Management (AMB) (Signed)
°  Chronic Care Management   Outreach Note  03/26/2019 Name: Steven Howard MRN: [id number] DOB: 30-Nov-1950  Referred by: Claretta Fraise, MD Reason for referral : Chronic Care Management (Initial CCM outreach was unsuccessful. )   An unsuccessful telephone outreach was attempted today. The patient was referred to the case management team by for assistance with chronic care management and care coordination.   Follow Up Plan: The care management team will reach out to the patient again over the next 7 days.   [place]  [email or web address]   ??[phone]

## 2019-03-28 NOTE — Chronic Care Management (AMB) (Signed)
°  Chronic Care Management   Outreach Note  03/28/2019 Name: MUADH CREASY MRN: [id number] DOB: [birth year]  Referred by: Claretta Fraise, MD Reason for referral : Chronic Care Management (Initial CCM outreach was unsuccessful. ) and Chronic Care Management (Second CCM outreach was unsuccessful. )   A second unsuccessful telephone outreach was attempted today. The patient was referred to the case management team for assistance with chronic care management and care coordination.   Follow Up Plan: The care management team will reach out to the patient again over the next 7 days.   [place]  [email or web address]   ??[phone]

## 2019-04-05 ENCOUNTER — Ambulatory Visit: Payer: Medicare HMO | Admitting: Adult Health

## 2019-04-06 ENCOUNTER — Other Ambulatory Visit: Payer: Self-pay

## 2019-04-07 DIAGNOSIS — G4733 Obstructive sleep apnea (adult) (pediatric): Secondary | ICD-10-CM | POA: Diagnosis not present

## 2019-04-09 ENCOUNTER — Other Ambulatory Visit: Payer: Self-pay

## 2019-04-09 ENCOUNTER — Ambulatory Visit (INDEPENDENT_AMBULATORY_CARE_PROVIDER_SITE_OTHER): Payer: Medicare HMO | Admitting: Family Medicine

## 2019-04-09 ENCOUNTER — Encounter: Payer: Self-pay | Admitting: Family Medicine

## 2019-04-09 VITALS — BP 105/64 | HR 91 | Temp 97.1°F | Ht 72.0 in | Wt 221.0 lb

## 2019-04-09 DIAGNOSIS — G4733 Obstructive sleep apnea (adult) (pediatric): Secondary | ICD-10-CM | POA: Diagnosis not present

## 2019-04-09 DIAGNOSIS — I10 Essential (primary) hypertension: Secondary | ICD-10-CM

## 2019-04-09 DIAGNOSIS — J449 Chronic obstructive pulmonary disease, unspecified: Secondary | ICD-10-CM

## 2019-04-09 DIAGNOSIS — G3281 Cerebellar ataxia in diseases classified elsewhere: Secondary | ICD-10-CM | POA: Diagnosis not present

## 2019-04-09 DIAGNOSIS — I951 Orthostatic hypotension: Secondary | ICD-10-CM | POA: Diagnosis not present

## 2019-04-09 DIAGNOSIS — G4452 New daily persistent headache (NDPH): Secondary | ICD-10-CM | POA: Diagnosis not present

## 2019-04-09 MED ORDER — MIDODRINE HCL 10 MG PO TABS: 20.0000 mg | ORAL_TABLET | Freq: Two times a day (BID) | ORAL | 2 refills | Status: DC

## 2019-04-09 NOTE — Progress Notes (Signed)
Subjective:  Patient ID: Steven Howard, male    DOB: 1951-03-06  Age: 68 y.o. MRN: [id number]  CC: No chief complaint on file.   HPI Steven Howard presents for follow-up on his orthostasis.  He continues to take Mid[place].  However, there has been very little improvement.  He says he gets episodes that just last a few moments several times a day that he describes as like he just got off her right at the carnival.  He denies any room spinning sensation.  He has not lost consciousness.  There is some posterior headache.  No focal neurologic changes noted.  That is no weakness of an arm or leg.  He has a relative who is in the medical field, an Therapist, sports.  She is concerned that he might have normal pressure hydrocephalus.  Depression screen [facility] 2/9 04/09/2019 01/29/2019 12/18/2018  Decreased Interest 0 0 0  Down, Depressed, Hopeless 0 0 0  PHQ - 2 Score 0 0 0  Altered sleeping 0 - -  Tired, decreased energy 0 - -  Change in appetite 0 - -  Feeling bad or failure about yourself  0 - -  Trouble concentrating 0 - -  Moving slowly or fidgety/restless 0 - -  Suicidal thoughts 0 - -  PHQ-9 Score 0 - -  Difficult doing work/chores - - -    History Steven Howard has a past medical history of Cancer ([place]) (2011), COPD (chronic obstructive pulmonary disease) ([place]), Glaucoma, and Sleep apnea.   He has a past surgical history that includes Prostate surgery (2011) and Cystoscopy with urethral dilatation (N/A, 06/27/2018).   His family history includes Alcohol abuse in his brother; COPD in his brother; Stroke in his mother.He reports that he has been smoking cigars and cigarettes. He has a 15.00 pack-year smoking history. He has never used smokeless tobacco. He reports current alcohol use of about 5.0 standard drinks of alcohol per week. He reports that he does not use drugs.    ROS Review of Systems  Constitutional: Negative.  Negative for fever.  HENT: Negative.   Eyes: Negative for visual disturbance.    Respiratory: Negative for cough and shortness of breath.   Cardiovascular: Negative for chest pain and leg swelling.  Gastrointestinal: Negative for abdominal pain, diarrhea, nausea and vomiting.  Genitourinary: Negative for difficulty urinating.  Musculoskeletal: Negative for arthralgias and myalgias.  Skin: Negative for rash.  Neurological: Negative for headaches.  Psychiatric/Behavioral: Negative for sleep disturbance.    Objective:  BP 105/64    Pulse 91    Temp (!) 97.1 F (36.2 C) (Temporal)    Ht 6' (1.829 m)    Wt 221 lb (100.2 kg)    BMI 29.97 kg/m   BP Readings from Last 3 Encounters:  04/09/19 105/64  02/16/19 126/86  01/29/19 105/74    Wt Readings from Last 3 Encounters:  04/09/19 221 lb (100.2 kg)  02/16/19 208 lb (94.3 kg)  01/29/19 206 lb (93.4 kg)     Physical Exam Constitutional:      General: He is not in acute distress.    Appearance: He is well-developed.  HENT:     Head: Normocephalic and atraumatic.     Right Ear: External ear normal.     Left Ear: External ear normal.     Nose: Nose normal.  Eyes:     Conjunctiva/sclera: Conjunctivae normal.     Pupils: Pupils are equal, round, and reactive to light.  Neck:  Musculoskeletal: Normal range of motion and neck supple.  Cardiovascular:     Rate and Rhythm: Normal rate and regular rhythm.     Heart sounds: Normal heart sounds. No murmur.  Pulmonary:     Effort: Pulmonary effort is normal. No respiratory distress.     Breath sounds: Normal breath sounds. No wheezing or rales.  Abdominal:     Palpations: Abdomen is soft.     Tenderness: There is no abdominal tenderness.  Musculoskeletal: Normal range of motion.  Skin:    General: Skin is warm and dry.  Neurological:     Mental Status: He is alert and oriented to person, place, and time.     Deep Tendon Reflexes: Reflexes are normal and symmetric.  Psychiatric:        Behavior: Behavior normal.        Thought Content: Thought content  normal.        Judgment: Judgment normal.       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       I have discontinued Lon F. Filler's albuterol, Abdominal Binder/Elastic Med, Medical Compression Stockings, meclizine, and predniSONE. I have also changed his midodrine. Additionally, I am having him maintain his traZODone.  Allergies as of 04/09/2019   No Known Allergies     Medication List       Accurate as of April 09, 2019 11:21 AM. If you have any questions, ask your nurse or doctor.        STOP taking these medications   Abdominal Binder/Elastic Med Misc Stopped by: Claretta Fraise, MD   albuterol 108 (90 Base) MCG/ACT inhaler Commonly known as: VENTOLIN HFA Stopped by: Claretta Fraise, MD   meclizine 25 MG tablet Commonly known as: ANTIVERT Stopped by: Claretta Fraise, MD   Medical Compression Stockings Misc Stopped by: Claretta Fraise, MD   predniSONE 10 MG tablet Commonly known as: DELTASONE Stopped by: Claretta Fraise, MD     TAKE these medications   midodrine 10 MG tablet Commonly known as: PROAMATINE Take 2 tablets (20 mg total) by mouth 2 (two) times daily. What changed:   how much to take  when to take this Changed by: Claretta Fraise, MD   traZODone 100 MG tablet Commonly known as: DESYREL 100 mg at bedtime as needed.        Follow-up: Return in about 3 months (around 07/10/2019).  Claretta Fraise, M.D.

## 2019-04-16 NOTE — Chronic Care Management (AMB) (Signed)
Chronic Care Management   Note  04/16/2019 Name: JOSIP MEROLLA MRN: [id number] DOB: 1951/06/21  DIJUAN SLEETH is a 68 y.o. year old male who is a primary care patient of Stacks, Cletus Gash, MD. I reached out to [name] by phone today in response to a referral sent by Mr. [facility] health plan.    Mr. Headen was given information about Chronic Care Management services today including:  1. CCM service includes personalized support from designated clinical staff supervised by his physician, including individualized plan of care and coordination with other care providers 2. 24/7 contact phone numbers for assistance for urgent and routine care needs. 3. Service will only be billed when office clinical staff spend 20 minutes or more in a month to coordinate care. 4. Only one practitioner may furnish and bill the service in a calendar month. 5. The patient may stop CCM services at any time (effective at the end of the month) by phone call to the office staff. 6. The patient will be responsible for cost sharing (co-pay) of up to 20% of the service fee (after annual deductible is met).  Patient did not agree to enrollment in care management services and does not wish to consider at this time.  Follow up plan: The patient has been provided with contact information for the chronic care management team and has been advised to call with any health related questions or concerns.   [place]  [email or web address]   ??[phone]

## 2019-04-23 NOTE — Progress Notes (Signed)
NEUROLOGY CONSULTATION NOTE  Steven Howard MRN: [id number] DOB: May 30, 1951  Referring provider: Claretta Fraise, MD Primary care provider: Claretta Fraise, MD  Reason for consult:  Headache, cerebellar ataxia  HISTORY OF PRESENT ILLNESS: Steven Howard is a 68 year old male with COPD and history of prostate cancer who presents for headache and cerebellar ataxia.  History supplemented by cardiology and referring provider notes.  He reports problems with balance a little over a year.  When he stands up, he feels off balance.  It's a dizzy feeling like after getting off of a boat or after spinning around.  It is not a spinning or sensation of going to pass out.  It typically resolves after a few moments but feels unsteady when he walks due to residual dizzy feeling.  He was diagnosed with orthostatic hypotension.  He has been evaluated by cardiology.  He has history of daily alcohol use and was encouraged to limit alcohol intake and increase water and salt intake.  He had not been using Midrin or Florinef or compression stockings or abdominal binders.  Stopped Florinef due to diarrhea.  Continues to drink beer and hard liquor daily.  His sister, an Therapist, sports, was concerned about NPH.    He developed vertigo on 03/11/19.  This time, he describes the sensation as room spinning.  That subsequently resolved.    He has a little dribble but no urinary incontinence.  He sometimes has a dull non-throbbing headache across the forehead.  No associated nausea, vomiting, photophobia, phonophobia, visual disturbance or unilateral numbness or weakness.  It started about a month ago.  He takes a decongestant which helps the frontal pressure.  They occur once or twice a week.  He also has soreness and tightness in the back of his neck.    LABS: 01/29/19:  CBC with WBC 6.4, HGB 16.6, HCT 48.9, PLT 227 12/18/18:  CMP with Na 141, K 4.5, Cl 95, CO2 27, glucose 123, BUN 4, Cr 0.67, t bili 0.3, ALP 82, AST 42, ALT 19; cortisol  10.5.  PAST MEDICAL HISTORY: Past Medical History:  Diagnosis Date  . Cancer [facility]) 2011   Prostate  . COPD (chronic obstructive pulmonary disease) ([place])   . Glaucoma   . Sleep apnea     PAST SURGICAL HISTORY: Past Surgical History:  Procedure Laterality Date  . 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 PROSTATE SURGERY  2011   seeds implanted/radiation    MEDICATIONS: Current Outpatient Medications on File Prior to Visit  Medication Sig Dispense Refill  . midodrine (PROAMATINE) 10 MG tablet Take 2 tablets (20 mg total) by mouth 2 (two) times daily. 120 tablet 2  . traZODone (DESYREL) 100 MG tablet 100 mg at bedtime as needed.      No current facility-administered medications on file prior to visit.     ALLERGIES: No Known Allergies  FAMILY HISTORY: Family History  Problem Relation Age of Onset  . Stroke Mother   . Alcohol abuse Brother   . COPD Brother     SOCIAL HISTORY: Social History   Socioeconomic History  . Marital status: Widowed    Spouse name: Not on file  . Number of children: 2  . Years of education: Not on file  . Highest education level: High school graduate  Occupational History  . Occupation: Retired  Social Needs  . Financial resource strain: Not hard at all  . Food insecurity    Worry: Never true    Inability: Never true  . Transportation needs    Medical: No    Non-medical: No  Tobacco Use  . Smoking status: Current Every Day Smoker    Packs/day: 0.50    Years: 30.00    Pack years: 15.00    Types: Cigars, Cigarettes  . Smokeless tobacco: Never Used  Substance and Sexual Activity  . Alcohol use: Yes    Alcohol/week: 5.0 standard drinks    Types: 5 Standard drinks or equivalent per week  . Drug use: Never  . Sexual activity: Not Currently  Lifestyle  .  Physical activity    Days per week: 0 days    Minutes per session: 0 min  . Stress: Not at all  Relationships  . Social Herbalist on phone: Three times a week    Gets together: Three times a week    Attends religious service: Never    Active member of club or organization: No    Attends meetings of clubs or organizations: Never    Relationship status: Widowed  . Intimate partner violence    Fear of current or ex partner: No    Emotionally abused: No    Physically abused: No    Forced sexual activity: No  Other Topics Concern  . Not on file  Social History Narrative   Drinks a two shots per day.  Drinks a shot in the morning.  Lives alone.  Widower.  Two sons live local.      REVIEW OF SYSTEMS: Constitutional: No fevers, chills, or sweats, no generalized fatigue, change in appetite Eyes: No visual changes, double vision, eye pain Ear, nose and throat: No hearing loss, ear pain, nasal congestion, sore throat Cardiovascular: No chest pain, palpitations Respiratory:  No shortness of breath at rest or with exertion, wheezes GastrointestinaI: No nausea, vomiting, diarrhea, abdominal pain, fecal incontinence Genitourinary:  No dysuria, urinary retention or frequency Musculoskeletal:  No neck pain, back pain Integumentary: No rash, pruritus, skin lesions Neurological: as above Psychiatric: No depression, insomnia, anxiety Endocrine: No palpitations, fatigue, diaphoresis, mood swings, change in appetite, change in weight, increased thirst Hematologic/Lymphatic:  No purpura, petechiae. Allergic/Immunologic: no itchy/runny eyes, nasal congestion, recent allergic reactions, rashes  PHYSICAL EXAM: Blood pressure (!) 147/87, pulse 74, temperature (!) 97.4 F (36.3 C), height 6' (1.829 m), weight 216 lb 3.2 oz (98.1 kg), SpO2 96 %. General: No acute distress.  Patient appears well-groomed. Head:  Normocephalic/atraumatic Eyes:  fundi examined but not visualized Neck: supple,  no paraspinal tenderness, full range of motion Back: No paraspinal tenderness Heart: regular rate and rhythm Lungs: Clear to auscultation bilaterally. Vascular: No carotid bruits. Neurological Exam: Mental status: alert and oriented to person, place, and time, recent and remote memory intact, fund of knowledge intact, attention and concentration intact, speech fluent and not dysarthric, language intact. Cranial nerves: CN I: not tested CN II: pupils equal, round and reactive to light, visual fields intact CN III, IV, VI:  full range of motion, no nystagmus, no ptosis CN V: facial sensation intact CN VII: upper and lower face symmetric CN VIII: hearing intact CN IX, X: gag intact, uvula midline CN XI: sternocleidomastoid and trapezius muscles intact CN XII: tongue midline Bulk & Tone: normal, no fasciculations. Motor:  5/5 throughout  Sensation:  Pinprick sensation intact; vibration sensation slightly reduced in toes.  Deep  Tendon Reflexes:  1+ throughout except 2+ in patellars, toes downgoing. Finger to nose testing:  Without dysmetria.   Heel to shin:  Without dysmetria.   Gait:  Mildly wide-based gait.  Able to turn.  Unable to tandem walk.  Romberg negative.  IMPRESSION: 1.  Unsteady gait.  May consider NPH.  Otherwise, may be multifactorial (due to orthostatic hypotension, history of alcohol use, maybe mild neuropathy in feet due to history of alcohol use).  No signs on exam to suggest myelopathy. 2.  Headaches.  Likely tension type headaches but as these are new, secondary etiology should be ruled out.  He also has some myofascial neck pain, which may be contributing.  PLAN: 1.  MRI of brain without contrast. 2.  Recommend to stop decongestant and treat headache with either ibuprofen, Excedrin Migraine or acetaminophen.  Limit use of pain relievers to no more than 2 days out of week to prevent risk of rebound or medication-overuse headache. 3.  Patient defers daily headache  preventative 4.  Advised on etoH cessation. 5.  Further recommendations pending results.  Thank you for allowing me to take part in the care of this patient.  Metta Clines, DO  CC: Claretta Fraise, MD

## 2019-04-24 ENCOUNTER — Ambulatory Visit (INDEPENDENT_AMBULATORY_CARE_PROVIDER_SITE_OTHER): Payer: Medicare HMO | Admitting: Neurology

## 2019-04-24 ENCOUNTER — Encounter: Payer: Self-pay | Admitting: Neurology

## 2019-04-24 ENCOUNTER — Other Ambulatory Visit: Payer: Self-pay

## 2019-04-24 VITALS — BP 147/87 | HR 74 | Temp 97.4°F | Ht 72.0 in | Wt 216.2 lb

## 2019-04-24 DIAGNOSIS — F1010 Alcohol abuse, uncomplicated: Secondary | ICD-10-CM | POA: Diagnosis not present

## 2019-04-24 DIAGNOSIS — R519 Headache, unspecified: Secondary | ICD-10-CM

## 2019-04-24 DIAGNOSIS — I951 Orthostatic hypotension: Secondary | ICD-10-CM | POA: Diagnosis not present

## 2019-04-24 DIAGNOSIS — G44209 Tension-type headache, unspecified, not intractable: Secondary | ICD-10-CM

## 2019-04-24 DIAGNOSIS — R51 Headache: Secondary | ICD-10-CM | POA: Diagnosis not present

## 2019-04-24 DIAGNOSIS — R2681 Unsteadiness on feet: Secondary | ICD-10-CM | POA: Diagnosis not present

## 2019-04-24 NOTE — Patient Instructions (Signed)
1.  We will check MRI of brain without contrast 2.  Stop decongestant.  Treat headache with ibuprofen, Excedrin Migraine or acetaminophen but limit use of pain relievers to no more than 2 days out of week to prevent risk of rebound or medication-overuse headache. 3.  Further recommendations pending results.

## 2019-05-08 DIAGNOSIS — G4733 Obstructive sleep apnea (adult) (pediatric): Secondary | ICD-10-CM | POA: Diagnosis not present

## 2019-05-25 ENCOUNTER — Ambulatory Visit
Admission: RE | Admit: 2019-05-25 | Discharge: 2019-05-25 | Disposition: A | Discharge status: Home / Self Care | Payer: Medicare HMO | Source: Ambulatory Visit | Attending: Neurology | Admitting: Neurology

## 2019-05-25 ENCOUNTER — Other Ambulatory Visit: Payer: Self-pay

## 2019-05-25 DIAGNOSIS — R519 Headache, unspecified: Secondary | ICD-10-CM

## 2019-05-25 DIAGNOSIS — R51 Headache: Secondary | ICD-10-CM | POA: Diagnosis not present

## 2019-05-25 DIAGNOSIS — R2681 Unsteadiness on feet: Secondary | ICD-10-CM

## 2019-05-29 ENCOUNTER — Telehealth: Payer: Self-pay | Admitting: Neurology

## 2019-05-29 DIAGNOSIS — Q2730 Arteriovenous malformation, site unspecified: Secondary | ICD-10-CM

## 2019-05-29 NOTE — Telephone Encounter (Signed)
I spoke with Mr. Dannenberg and discussed the MRI results.  I would like to refer him to Dr. Luanne Bras for evaluation of arteriovenous malformation.

## 2019-05-29 NOTE — Telephone Encounter (Signed)
Patient returned a call to the office. He said Dr. Tomi Likens had called him yesterday regarding MRI results. Thank you

## 2019-05-29 NOTE — Telephone Encounter (Signed)
Dr Tomi Likens- didn't see any results for [place] to call to this patient. Is he returning your call from yesterday?

## 2019-05-31 NOTE — Telephone Encounter (Signed)
Heard from [place] with Dr. Estanislado Pandy and they received referral and will pass on for review and get him scheduled.

## 2019-05-31 NOTE — Telephone Encounter (Signed)
Ordered ambulatory referral for IR with Dr. Arlean Hopping. Sent staff message to Jennifer/Ashley in IR to make sure they received order and to see if there is anything else that needs to be done.

## 2019-06-07 DIAGNOSIS — G4733 Obstructive sleep apnea (adult) (pediatric): Secondary | ICD-10-CM | POA: Diagnosis not present

## 2019-06-12 ENCOUNTER — Other Ambulatory Visit (HOSPITAL_COMMUNITY): Payer: Self-pay | Admitting: Interventional Radiology

## 2019-06-12 DIAGNOSIS — Q2730 Arteriovenous malformation, site unspecified: Secondary | ICD-10-CM

## 2019-06-13 ENCOUNTER — Other Ambulatory Visit: Payer: Self-pay

## 2019-06-13 ENCOUNTER — Ambulatory Visit (HOSPITAL_COMMUNITY)
Admission: RE | Admit: 2019-06-13 | Discharge: 2019-06-13 | Disposition: A | Discharge status: Home / Self Care | Payer: Medicare HMO | Source: Ambulatory Visit | Attending: Interventional Radiology | Admitting: Interventional Radiology

## 2019-06-13 DIAGNOSIS — Q2730 Arteriovenous malformation, site unspecified: Secondary | ICD-10-CM | POA: Diagnosis not present

## 2019-06-13 NOTE — Consult Note (Signed)
Chief Complaint: Patient was seen in consultation today for DAVM.  Referring Physician(s): Pieter Partridge  Supervising Physician: Luanne Bras  Patient Status: [facility] - Out-[facility]  History of Present Illness: Steven Howard is a 68 y.o. male with a past medical history as below, with pertinent past medical history including COPD, prostate cancer, glaucoma, and sleep apnea. He was recently referred to neurology by his PCP for management of headaches and cerebellar ataxia. He was seen by Dr. Tomi Likens 04/24/2019 who ordered appropriate imaging scans for further evaluation.  MR brain 05/25/2019: 1. 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 Mild atrophy and gliosis in that region. There is not much if any evidence of previous hemorrhage. 2. Moderate chronic small-vessel ischemic changes of the cerebral hemispheric white matter otherwise.  NIR consulted by Dr. Tomi Likens for management of DAVM involving the right temporal lobe. Patient awake and alert sitting in chair. Complains of intermittent dizziness. States he has had intermittent dizziness for months that started suddenly. States it is worse when ambulating (uses cane for ambulation). States that he has difficulty climbing stairs due to dizziness. Denies syncope, vision changes, or N/V associated with dizziness. Complains of intermittent headaches, stable. Denies weakness, numbness/tingling, vision changes, hearing changes, tinnitus, or speech difficulty.   Past Medical History:  Diagnosis Date   Cancer [facility]) 2011   Prostate   COPD (chronic obstructive pulmonary disease) ([place])    Glaucoma    Sleep apnea     Past Surgical History:  Procedure Laterality Date   CYSTOSCOPY WITH URETHRAL DILATATION N/A 06/27/2018   Procedure: CYSTOSCOPY WITH BALLOON DILATATION OF URETHRAL STRICTURE;  Surgeon: Franchot Gallo, MD;  Location: [facility];  Service: Urology;   Laterality: N/A;   PROSTATE SURGERY  2011   seeds implanted/radiation    Allergies: Patient has no known allergies.  Medications: Prior to Admission medications   Medication Sig Start Date End Date Taking? Authorizing Provider  midodrine (PROAMATINE) 10 MG tablet Take 2 tablets (20 mg total) by mouth 2 (two) times daily. 04/09/19   Claretta Fraise, MD  traZODone (DESYREL) 100 MG tablet 100 mg at bedtime as needed.  02/05/19   [provider]     Family History  Problem Relation Age of Onset   Stroke Mother    Alcohol abuse Brother    COPD Brother     Social History   Socioeconomic History   Marital status: Widowed    Spouse name: Not on file   Number of children: 2   Years of education: Not on file   Highest education level: High school graduate  Occupational History   Occupation: Retired  Scientist, product/process development strain: Not hard at [employer] insecurity    Worry: Never true    Inability: Never true   Transportation needs    Medical: No    Non-medical: No  Tobacco Use   Smoking status: Current Every Day Smoker    Packs/day: 0.50    Years: 30.00    Pack years: 15.00    Types: Cigars, Cigarettes   Smokeless tobacco: Never Used  Substance and Sexual Activity   Alcohol use: Yes    Alcohol/week: 5.0 standard drinks    Types: 5 Standard drinks or equivalent per week    Comment: 2-3 beers a day (8/25)   Drug use: Never   Sexual activity: Not Currently  Lifestyle   Physical activity  Days per week: 0 days    Minutes per session: 0 min   Stress: Not at all  Relationships   Social connections    Talks on phone: Three times a week    Gets together: Three times a week    Attends religious service: Never    Active member of club or organization: No    Attends meetings of clubs or organizations: Never    Relationship status: Widowed  Other Topics Concern   Not on file  Social History Narrative   Drinks a two shots per day.   Drinks a shot in the morning.  Lives alone.  Widower.  Two sons live local.        8/25 lives alone one level home; R handed; retired - high school graduate  ; caffeine 2-3 cups coffee/week; no regular exercise     Review of Systems: A 12 point ROS discussed and pertinent positives are indicated in the HPI above.  All other systems are negative.  Review of Systems  Constitutional: Negative for chills and fever.  HENT: Negative for hearing loss and tinnitus.   Eyes: Negative for visual disturbance.  Respiratory: Negative for shortness of breath and wheezing.   Cardiovascular: Negative for chest pain and palpitations.  Gastrointestinal: Negative for abdominal pain, nausea and vomiting.  Neurological: Positive for dizziness and headaches. Negative for syncope, speech difficulty, weakness and numbness.  Psychiatric/Behavioral: Negative for behavioral problems and confusion.    Physical Exam Constitutional:      General: He is not in acute distress.    Appearance: Normal appearance.  Pulmonary:     Effort: Pulmonary effort is normal. No respiratory distress.  Skin:    General: Skin is warm and dry.  Neurological:     Mental Status: He is alert and oriented to person, place, and time.  Psychiatric:        Mood and Affect: Mood normal.        Behavior: Behavior normal.        Thought Content: Thought content normal.        Judgment: Judgment normal.      Imaging: Mr Brain Wo Contrast  Result Date: 05/26/2019 CLINICAL DATA:  New onset headache over age 1. EXAM: MRI HEAD WITHOUT CONTRAST TECHNIQUE: Multiplanar, multiecho pulse sequences of the brain and surrounding structures were obtained without intravenous contrast. COMPARISON:  None. FINDINGS: Brain: Diffusion imaging does not show any acute or subacute infarction. No intrinsic abnormality of the brainstem or cerebellum. There is an extensive pial dural arteriovenous malformation at the base of the brain on the right affecting  the inferior temporal lobe. There is volume loss and gliosis affecting the inferior temporal lobe. There is minimal of any hemosiderin deposition in the region. Elsewhere, cerebral hemispheres show mild to moderate chronic small-vessel ischemic changes of the deep and subcortical white matter. No large vessel territory ischemic infarction. No neoplastic mass lesion, hemorrhage, hydrocephalus or extra-axial collection. Vascular: Major arterial and venous structures at the base of the brain show flow. Details of the supply and drainage of this arteriovenous malformation would be best achieved by imaging based angiography or catheter angiography. Skull and upper cervical spine: Negative Sinuses/Orbits: Clear sinuses. Insignificant retention cyst in the left sphenoid sinus. Orbits negative. 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 Mild atrophy and gliosis in that region. There is not much if any evidence of previous hemorrhage. Moderate  chronic small-vessel ischemic changes of the cerebral hemispheric white matter otherwise. These results will be called to the ordering clinician or representative by the Radiologist Assistant, and communication documented in the PACS or zVision Dashboard. As this is now Saturday night and the patient is not acutely ill and does not show any acute imaging finding, the examination report can be called on Monday morning. Electronically Signed   By: Nelson Chimes M.D.   On: 05/26/2019 19:53    Labs:  CBC: Recent Labs    06/21/18 1401 07/11/18 1044 11/03/18 1526 01/29/19 1455  WBC 8.5 6.6 7.9 6.4  HGB 17.7* 17.8* 13.6 16.6  HCT 53.1* 51.3* 39.4 48.9  PLT 237 279 302 227    COAGS: Recent Labs    01/29/19 1453  INR 1.1    BMP: Recent Labs    06/21/18 1401 07/11/18 1044 11/03/18 1526 12/18/18 1430  NA 135 136 132* 141  K 3.7 4.4 4.3 4.5  CL 97* 94* 91* 95*    CO2 29 26 27 27   GLUCOSE 91 74 97 123*  BUN 7* 6* 5* 4*  CALCIUM 9.3 9.6 8.9 9.1  CREATININE 0.60* 0.61* 0.72* 0.67*  GFRNONAA >60 102 95 98  GFRAA >60 118 110 113    LIVER FUNCTION TESTS: Recent Labs    07/11/18 1044 11/03/18 1526 12/18/18 1430  BILITOT 0.5 0.5 0.3  AST 28 60* 42*  ALT 18 46* 19  ALKPHOS 89 78 82  PROT 6.4 5.8* 5.9*  ALBUMIN 3.8 3.4* 3.4*     Assessment and Plan:  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 Dr. Estanislado Pandy was present for consultation. Discussed findings of recent MRI brain (DAVM). Discussed nature of DAVMs. Explained this could be cause of his symptoms. Recommend image-guided diagnostic cerebral arteriogram to accurately view DAVM and plan treatment options. Discussed procedure in great detail, including risks and benefits. Patient expresses desire to move forward with procedure.  Plan for follow-up with an image-guided diagnostic cerebral arteriogram, first available. Informed patient that our schedulers will call him to set up this procedure.  All questions answered and concerns addressed. Patient conveys understanding and agrees with plan.  Thank you for this interesting consult.  I greatly enjoyed meeting Steven Howard and look forward to participating in their care.  A copy of this report was sent to the requesting provider on this date.  Electronically Signed: Earley Abide, PA-C 06/13/2019, 10:52 AM   I spent a total of 40 Minutes in face to face in clinical consultation, greater than 50% of which was counseling/coordinating care for DAVM.

## 2019-06-19 ENCOUNTER — Telehealth (HOSPITAL_COMMUNITY): Payer: Self-pay | Admitting: Radiology

## 2019-06-19 ENCOUNTER — Other Ambulatory Visit (HOSPITAL_COMMUNITY): Payer: Self-pay | Admitting: Interventional Radiology

## 2019-06-19 DIAGNOSIS — Q282 Arteriovenous malformation of cerebral vessels: Secondary | ICD-10-CM

## 2019-06-19 NOTE — Telephone Encounter (Signed)
Called pt to schedule cerebral angiogram with Deveshwar. Patient has decided to take a month to think about whether he wants to do the angiogram or treatment at all. I will call him back in 1 month. JM

## 2019-07-10 ENCOUNTER — Other Ambulatory Visit: Payer: Self-pay

## 2019-07-11 ENCOUNTER — Ambulatory Visit (INDEPENDENT_AMBULATORY_CARE_PROVIDER_SITE_OTHER): Payer: Medicare HMO | Admitting: Family Medicine

## 2019-07-11 ENCOUNTER — Encounter: Payer: Self-pay | Admitting: Family Medicine

## 2019-07-11 VITALS — BP 96/66 | HR 99 | Temp 98.4°F | Ht 72.0 in | Wt 221.2 lb

## 2019-07-11 DIAGNOSIS — R42 Dizziness and giddiness: Secondary | ICD-10-CM

## 2019-07-11 DIAGNOSIS — I951 Orthostatic hypotension: Secondary | ICD-10-CM | POA: Diagnosis not present

## 2019-07-11 DIAGNOSIS — E782 Mixed hyperlipidemia: Secondary | ICD-10-CM

## 2019-07-11 MED ORDER — MIDODRINE HCL 10 MG PO TABS: 20.0000 mg | ORAL_TABLET | Freq: Two times a day (BID) | ORAL | 2 refills | Status: DC

## 2019-07-11 NOTE — Progress Notes (Signed)
Subjective:  Patient ID: Steven Howard, male    DOB: [birth year]  Age: 68 y.o. MRN: [id number]  CC: Medical Management of Chronic Issues   HPI KOUKI HOLZAPFEL presents for  follow-up of low blood pressure. He DCed his meds. He is still having dizziness, especially with positionchnage succh as standing. He also had an MRI of the brain showing an AV malformation involving the right temporal lobe. Pt. Relates that he was told that it could be surgically deflated, but there is a 08/998 chance of CVA so he has declined.   Depression screen [facility] 2/9 07/11/2019 04/09/2019 01/29/2019 12/18/2018 11/03/2018  Decreased Interest 0 0 0 0 0  Down, Depressed, Hopeless 0 0 0 0 0  PHQ - 2 Score 0 0 0 0 0  Altered sleeping - 0 - - -  Tired, decreased energy - 0 - - -  Change in appetite - 0 - - -  Feeling bad or failure about yourself  - 0 - - -  Trouble concentrating - 0 - - -  Moving slowly or fidgety/restless - 0 - - -  Suicidal thoughts - 0 - - -  PHQ-9 Score - 0 - - -  Difficult doing work/chores - - - - -    History Hendrix has a past medical history of Cancer ([place]) (2011), COPD (chronic obstructive pulmonary disease) ([place]), Glaucoma, and Sleep apnea.   He has a past surgical history that includes Prostate surgery (2011) and Cystoscopy with urethral dilatation (N/A, 06/27/2018).   His family history includes Alcohol abuse in his brother; COPD in his brother; Stroke in his mother.He reports that he has been smoking cigars and cigarettes. He has a 15.00 pack-year smoking history. He has never used smokeless tobacco. He reports current alcohol use of about 5.0 standard drinks of alcohol per week. He reports that he does not use drugs.  No current outpatient medications on file prior to visit.   No current facility-administered medications on file prior to visit.     ROS Review of Systems  Constitutional: Negative for fever.  Respiratory: Negative for shortness of breath.   Cardiovascular: Negative  for chest pain.  Musculoskeletal: Negative for arthralgias.  Skin: Negative for rash.  Neurological: Positive for dizziness. Negative for headaches.    Objective:  BP 96/66   Pulse 99   Temp 98.4 F (36.9 C) (Temporal)   Ht 6' (1.829 m)   Wt 221 lb 3.2 oz (100.3 kg)   SpO2 93%   BMI 30.00 kg/m   BP Readings from Last 3 Encounters:  07/11/19 96/66  04/24/19 (!) 147/87  04/09/19 105/64    Wt Readings from Last 3 Encounters:  07/11/19 221 lb 3.2 oz (100.3 kg)  04/24/19 216 lb 3.2 oz (98.1 kg)  04/09/19 221 lb (100.2 kg)     Physical Exam Vitals signs reviewed.  Constitutional:      Appearance: He is well-developed.  HENT:     Head: Normocephalic and atraumatic.     Right Ear: External ear normal.     Left Ear: External ear normal.     Mouth/Throat:     Pharynx: No oropharyngeal exudate or posterior oropharyngeal erythema.  Eyes:     Pupils: Pupils are equal, round, and reactive to light.  Neck:     Musculoskeletal: Normal range of motion and neck supple.  Cardiovascular:     Rate and Rhythm: Normal rate and regular rhythm.     Heart sounds: No murmur.  Pulmonary:     Effort: No respiratory distress.     Breath sounds: Normal breath sounds.  Neurological:     Mental Status: He is alert and oriented to person, place, and time.     Cranial Nerves: No cranial nerve deficit.     Coordination: Coordination normal.     Gait: Gait normal.  Psychiatric:        Mood and Affect: Mood normal.        Thought Content: Thought content normal.       Assessment & Plan:   Shawndel was seen today for medical management of chronic issues.  Diagnoses and all orders for this visit:  Dizziness  Mixed hyperlipidemia  Orthostatic hypotension  Other orders -     midodrine (PROAMATINE) 10 MG tablet; Take 2 tablets (20 mg total) by mouth 2 (two) times daily.   Allergies as of 07/11/2019   No Known Allergies     Medication List       Accurate as of July 11, 2019   7:53 PM. If you have any questions, ask your nurse or doctor.        STOP taking these medications   traZODone 100 MG tablet Commonly known as: DESYREL Stopped by: Claretta Fraise, MD     TAKE these medications   midodrine 10 MG tablet Commonly known as: PROAMATINE Take 2 tablets (20 mg total) by mouth 2 (two) times daily.       Meds ordered this encounter  Medications  . midodrine (PROAMATINE) 10 MG tablet    Sig: Take 2 tablets (20 mg total) by mouth 2 (two) times daily.    Dispense:  120 tablet    Refill:  2      Follow-up: Return in about 6 months (around 01/08/2020), or if symptoms worsen or fail to improve.  Claretta Fraise, M.D.

## 2019-09-11 ENCOUNTER — Telehealth: Payer: Self-pay | Admitting: *Deleted

## 2019-09-11 NOTE — Telephone Encounter (Signed)
A message was left, re: his follow visit.

## 2019-09-17 ENCOUNTER — Encounter: Payer: Self-pay | Admitting: Urology

## 2019-10-29 ENCOUNTER — Other Ambulatory Visit: Payer: Self-pay | Admitting: Family Medicine

## 2019-10-29 DIAGNOSIS — Z0000 Encounter for general adult medical examination without abnormal findings: Secondary | ICD-10-CM

## 2019-11-02 ENCOUNTER — Ambulatory Visit (INDEPENDENT_AMBULATORY_CARE_PROVIDER_SITE_OTHER): Payer: Medicare HMO

## 2019-11-02 DIAGNOSIS — Z0000 Encounter for general adult medical examination without abnormal findings: Secondary | ICD-10-CM | POA: Diagnosis not present

## 2019-11-02 NOTE — Progress Notes (Signed)
MEDICARE ANNUAL WELLNESS VISIT  11/02/2019  Telephone Visit Disclaimer This Medicare AWV was conducted by telephone due to national recommendations for restrictions regarding the COVID-19 Pandemic (e.g. social distancing).  I verified, using two identifiers, that I am speaking with Steven Howard or their authorized healthcare agent. I discussed the limitations, risks, security, and privacy concerns of performing an evaluation and management service by telephone and the potential availability of an in-person appointment in the future. The patient expressed understanding and agreed to proceed.   Subjective:  Steven Howard is a 69 y.o. male patient of Stacks, Steven Gash, MD who had a Medicare Annual Wellness Visit today via telephone. Steven Howard is Retired and lives alone. He has two children. He reports that he is not socially active and does interact with friends/family regularly. He is minimally physically active and enjoys watching tv.  Patient Care Team: Claretta Fraise, MD as PCP - General (Family Medicine) Minus Breeding, MD as PCP - Cardiology (Cardiology) Franchot Gallo, MD as Consulting Physician (Urology) Pieter Partridge, DO as Consulting Physician (Neurology)  Advanced Directives 11/02/2019 04/24/2019 11/01/2018 06/21/2018  Does Patient Have a Medical Advance Directive? No No No No  Would patient like information on creating a medical advance directive? No - Patient declined No - Patient declined Yes (ED - Information included in AVS) No - Patient declined    Hospital Utilization Over the Past 12 Months: # of hospitalizations or ER visits: 0 # of surgeries: 0  Review of Systems    Patient reports that his overall health is unchanged compared to last year.  History obtained from chart review and the patient  Patient Reported Readings (BP, Pulse, CBG, Weight, etc) none  Pain Assessment Pain : No/denies pain     Current Medications & Allergies (verified) Allergies as of  11/02/2019   No Known Allergies     Medication List       Accurate as of November 02, 2019  9:29 AM. If you have any questions, ask your nurse or doctor.        STOP taking these medications   midodrine 10 MG tablet Commonly known as: PROAMATINE       History (reviewed): Past Medical History:  Diagnosis Date  . Cancer [facility]) 2011   Prostate  . COPD (chronic obstructive pulmonary disease) ([place])   . Glaucoma   . Sleep apnea    Past Surgical History:  Procedure Laterality Date  . 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 PROSTATE SURGERY  2011   seeds implanted/radiation   Family History  Problem Relation Age of Onset  . Stroke Mother   . Alcohol abuse Brother   . Heart disease Maternal Grandmother   . COPD Brother    Social History   Socioeconomic History  . Marital status: Widowed    Spouse name: Not on file  . Number of children: 2  . Years of education: 79  . Highest education level: High school graduate  Occupational History  . Occupation: Retired  Tobacco Use  . Smoking status: Current Every Day Smoker    Packs/day: 0.25    Years: 55.00    Pack years: 13.75    Types: Cigars, Cigarettes  . Smokeless tobacco: Never Used  Substance and Sexual Activity  . Alcohol use: Yes    Comment: occasional  .  Drug use: Never  . Sexual activity: Not Currently  Other Topics Concern  . Not on file  Social History Narrative  . Not on file   Social Determinants of Health   Financial Resource Strain: Low Risk   . Difficulty of Paying Living Expenses: Not on file  Food Insecurity: No Food Insecurity  . Worried About Charity fundraiser in the Last Year: Not on file  . Ran Out of Food in the Last Year: Not on file  Transportation Needs: No Transportation Needs  . Lack of Transportation (Medical):  Not on file  . Lack of Transportation (Non-Medical): Not on file  Physical Activity: Inactive  . Days of Exercise per Week: Not on file  . Minutes of Exercise per Session: Not on file  Stress: No Stress Concern Present  . Feeling of Stress : Not on file  Social Connections: Moderately Isolated  . Frequency of Communication with Friends and Family: Not on file  . Frequency of Social Gatherings with Friends and Family: Not on file  . Attends Religious Services: Not on file  . Active Member of Clubs or Organizations: Not on file  . Attends Archivist Meetings: Not on file  . Marital Status: Not on file    Activities of Daily Living In your present state of health, do you have any difficulty performing the following activities: 11/02/2019  Hearing? N  Vision? N  Difficulty concentrating or making decisions? N  Walking or climbing stairs? Y  Comment knee pain when climbing stairs  Dressing or bathing? N  Doing errands, shopping? N  Using the Toilet? N  In the past six months, have you accidently leaked urine? N  Do you have problems with loss of bowel control? N  Managing your Medications? N  Managing your Finances? N  Housekeeping or managing your Housekeeping? N  Some recent data might be hidden    Patient Education/ Literacy How often do you need to have someone help you when you read instructions, pamphlets, or other written materials from your doctor or pharmacy?: 1 - Never What is the last grade level you completed in school?: 12th  Exercise Current Exercise Habits: The patient does not participate in regular exercise at present  Diet Patient reports consuming 2 meals a day and 3 snack(s) a day Patient reports that his primary diet is: Regular Patient reports that he  does have regular access to food.   Depression Screen PHQ 2/9 Scores 11/02/2019 07/11/2019 04/09/2019 01/29/2019 12/18/2018 11/03/2018 11/01/2018  PHQ - 2 Score 0 0 0 0 0 0 0  PHQ- 9 Score - - 0 - - - -       Fall Risk Fall Risk  11/02/2019 07/11/2019 04/24/2019 01/29/2019 12/18/2018  Falls in the past year? 0 0 0 1 0  Number falls in past yr: - - - 0 -  Injury with Fall? - - - 0 -     Objective:  Steven Howard seemed alert and oriented and he participated appropriately during our telephone visit.  Blood Pressure Weight BMI  BP Readings from Last 3 Encounters:  07/11/19 96/66  04/24/19 (!) 147/87  04/09/19 105/64   Wt Readings from Last 3 Encounters:  07/11/19 221 lb 3.2 oz (100.3 kg)  04/24/19 216 lb 3.2 oz (98.1 kg)  04/09/19 221 lb (100.2 kg)   BMI Readings from Last 1 Encounters:  07/11/19 30.00 kg/m    *Unable to obtain current vital signs, weight, and BMI due  to telephone visit type  Hearing/Vision  . Tyjay did not seem to have difficulty with hearing/understanding during the telephone conversation . Reports that he has not had a formal eye exam by an eye care professional within the past year . Reports that he has not had a formal hearing evaluation within the past year *Unable to fully assess hearing and vision during telephone visit type  Cognitive Function: 6CIT Screen 11/02/2019  What Year? 0 points  What month? 0 points  What time? 0 points  Count back from 20 0 points  Months in reverse 0 points  Repeat phrase 0 points  Total Score 0   (Normal:0-7, Significant for Dysfunction: >8)  Normal Cognitive Function Screening: Yes   Immunization & Health Maintenance Record  There is no immunization history on file for this patient.  Health Maintenance  Topic Date Due  . INFLUENZA VACCINE  11/28/2019 (Originally 03/31/2019)  . COLONOSCOPY  07/10/2020 (Originally 03/14/1999)  . TETANUS/TDAP  07/10/2020 (Originally 03/13/1968)  . PNA vac Low Risk Adult (1 of 2 - PCV13) 07/10/2020 (Originally 03/13/2014)  . Hepatitis C Screening  Completed       Assessment  This is a routine wellness examination for Steven Howard.  Health Maintenance: Due or Overdue There are no  preventive care reminders to display for this patient.  Steven Howard does not need a referral for Community Assistance: Care Management:   no Social Work:    no Prescription Assistance:  no Nutrition/Diabetes Education:  no   Plan:  Personalized Goals Goals Addressed            This Visit's Progress   . Patient Stated       11/02/2019 AWV Goal: Exercise for General Health   Patient will verbalize understanding of the benefits of increased physical activity:  Exercising regularly is important. It will improve your overall fitness, flexibility, and endurance.  Regular exercise also will improve your overall health. It can help you control your weight, reduce stress, and improve your bone density.  Over the next year, patient will increase physical activity as tolerated with a goal of at least 150 minutes of moderate physical activity per week.   You can tell that you are exercising at a moderate intensity if your heart starts beating faster and you start breathing faster but can still hold a conversation.  Moderate-intensity exercise ideas include:  Walking 1 mile (1.6 km) in about 15 minutes  Biking  Hiking  Golfing  Dancing  Water aerobics  Patient will verbalize understanding of everyday activities that increase physical activity by providing examples like the following: ? Yard work, such as: ? Pushing a Conservation officer, nature ? Raking and bagging leaves ? Washing your car ? Pushing a stroller ? Shoveling snow ? Gardening ? Washing windows or floors  Patient will be able to explain general safety guidelines for exercising:   Before you start a new exercise program, talk with your health care provider.  Do not exercise so much that you hurt yourself, feel dizzy, or get very short of breath.  Wear comfortable clothes and wear shoes with good support.  Drink plenty of water while you exercise to prevent dehydration or heat stroke.  Work out until your breathing and  your heartbeat get faster.             Personalized Health Maintenance & Screening Recommendations  Td vaccine Colorectal cancer screening  Lung Cancer Screening Recommended: yes (Low Dose CT Chest recommended if Age  55-80 years, 30 pack-year currently smoking OR have quit w/in past 15 years) Hepatitis C Screening recommended: yes HIV Screening recommended: yes  Advanced Directives: Written information was not prepared per patient's request.  Referrals & Orders No orders of the defined types were placed in this encounter.   Follow-up Plan . Follow-up with Claretta Fraise, MD as planned . Tdap vaccine . Prevnar vaccine . Shingrix accine       I have personally reviewed and noted the following in the patient's chart:   . Medical and social history . Use of alcohol, tobacco or illicit drugs  . Current medications and supplements . Functional ability and status . Nutritional status . Physical activity . Advanced directives . List of other physicians . Hospitalizations, surgeries, and ER visits in previous 12 months . Vitals . Screenings to include cognitive, depression, and falls . Referrals and appointments  In addition, I have reviewed and discussed with Steven Howard certain preventive protocols, quality metrics, and best practice recommendations. A written personalized care plan for preventive services as well as general preventive health recommendations is available and can be mailed to the patient at his request.      Felicity Coyer, LPN    [id number]

## 2019-12-15 IMAGING — MR MR HEAD W/O CM
10 series · 48 of 48 positions shown · non-contrast
Comparison: None.

CLINICAL DATA: New onset headache over age 50.

EXAM:
MRI HEAD WITHOUT CONTRAST
TECHNIQUE: Multiplanar, multiecho pulse sequences of the brain and surrounding
structures were obtained without intravenous contrast.

[Series 2: T1 · sagittal · 5.0mm · 0.45mm/px · 3 of 21 slices shown]
[im 1/21]
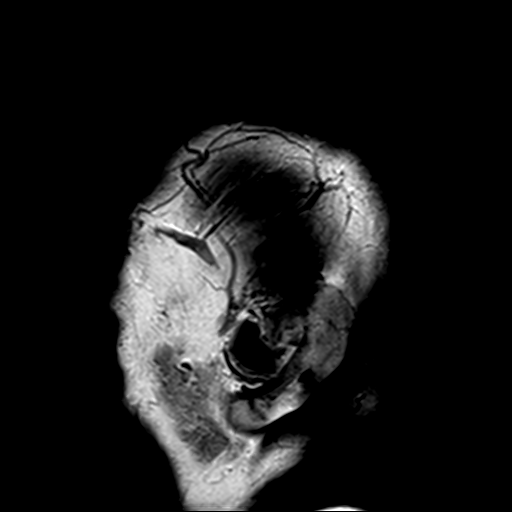
[im 11/21]
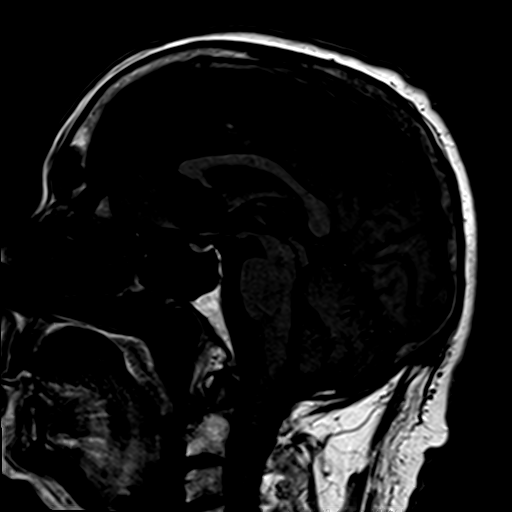
[im 21/21]
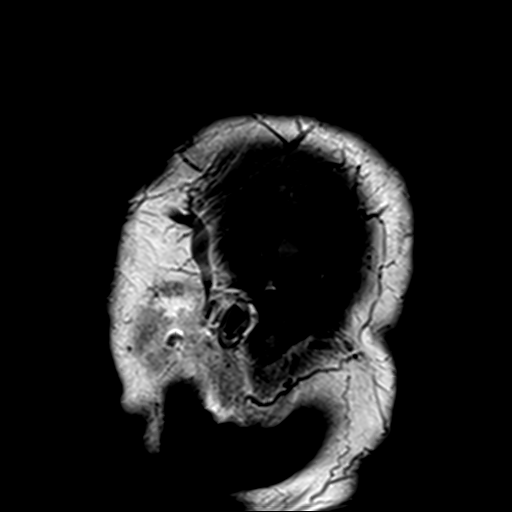

[Series 3: DWI · axial · 3.0mm · 1.80mm/px · z∈[-11,+133]mm · 9 of 100 slices shown (1 of 4)]
[im 1/100]
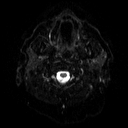
[im 13/100]
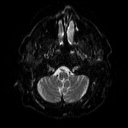
[im 25/100]
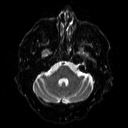
[im 38/100]
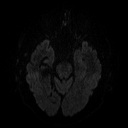
[im 50/100]
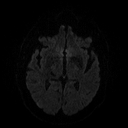
[im 62/100]
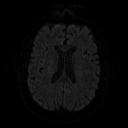
[im 75/100]
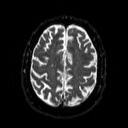
[im 87/100]
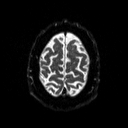
[im 100/100]
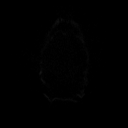

[Series 4: DWI · axial · 3.0mm · 1.80mm/px · z∈[-11,+133]mm · 4 of 49 slices shown (2 of 4)]
[im 1/49]
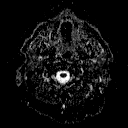
[im 17/49]
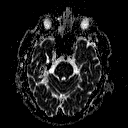
[im 33/49]
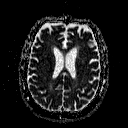
[im 49/49]
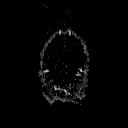

[Series 5: DWI · coronal · 5.0mm · 1.80mm/px · 6 of 68 slices shown (3 of 4)]
[im 1/68]
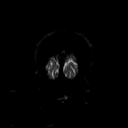
[im 14/68]
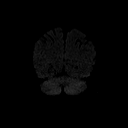
[im 27/68]
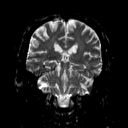
[im 41/68]
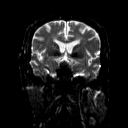
[im 54/68]
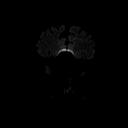
[im 68/68]
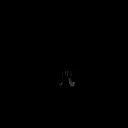

[Series 6: DWI · coronal · 5.0mm · 1.80mm/px · 3 of 34 slices shown (4 of 4)]
[im 1/34]
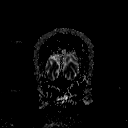
[im 17/34]
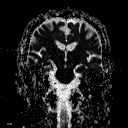
[im 34/34]
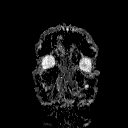

[Series 7: T2 · axial · 5.0mm · 0.60mm/px · z∈[-12,+132]mm · 2 of 22 slices shown (1 of 2)]
[im 1/22]
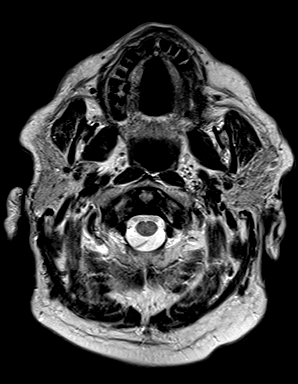
[im 22/22]
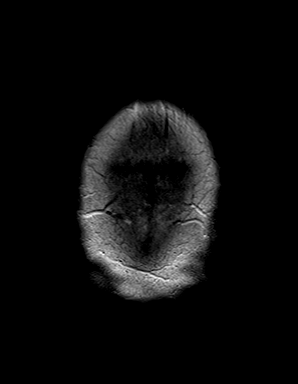

[Series 8: FLAIR · axial · 3.0mm · 0.45mm/px · z∈[-5,+127]mm · 3 of 30 slices shown]
[im 1/30]
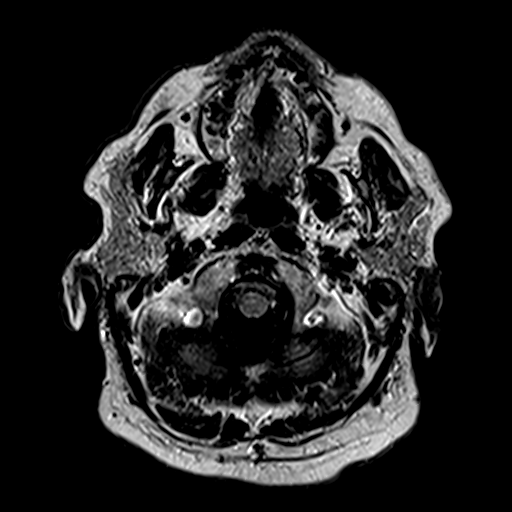
[im 15/30]
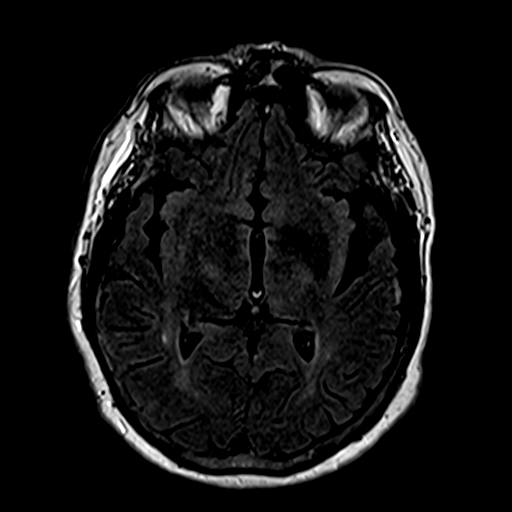
[im 30/30]
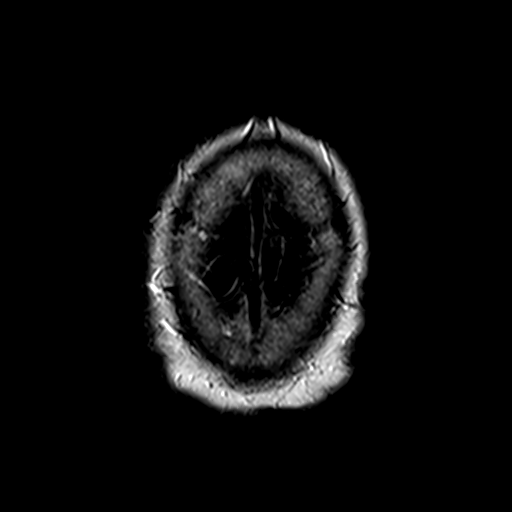

[Series 10: swi_images · axial · 4.0mm · 0.90mm/px · z∈[-8,+129]mm · 3 of 36 slices shown]
[im 1/36]
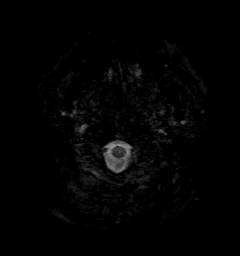
[im 18/36]
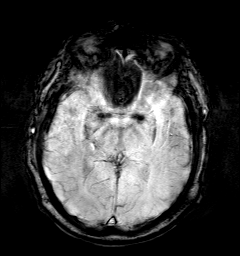
[im 36/36]
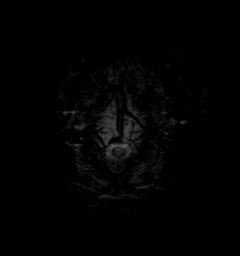

[Series 11: t1_mpr_tra · axial · 1.0mm · 0.71mm/px · z∈[-10,+130]mm · 13 of 144 slices shown]
[im 1/144]
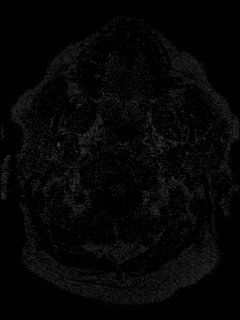
[im 12/144]
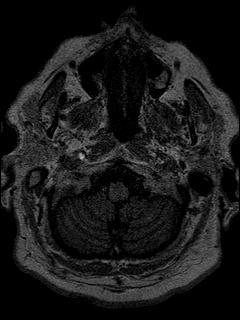
[im 24/144]
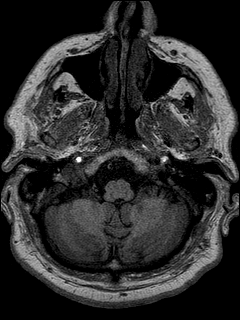
[im 36/144]
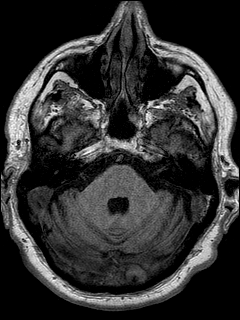
[im 48/144]
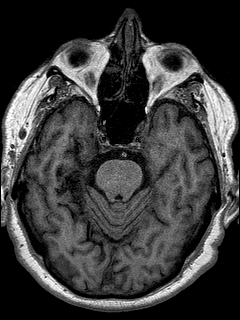
[im 60/144]
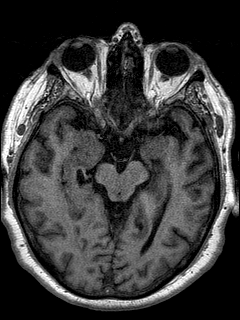
[im 72/144]
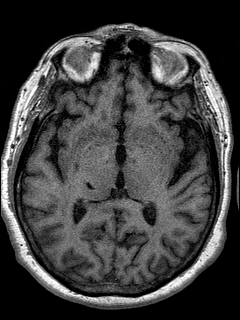
[im 84/144]
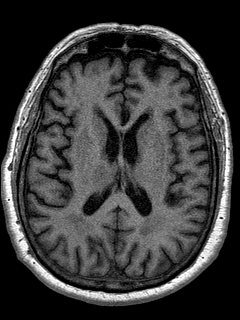
[im 96/144]
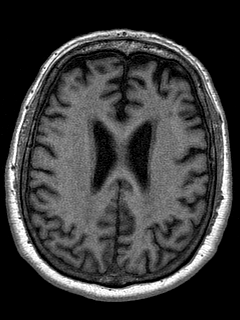
[im 108/144]
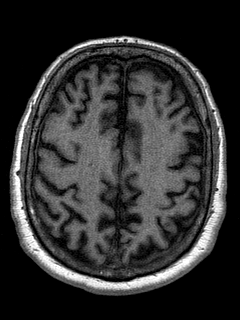
[im 120/144]
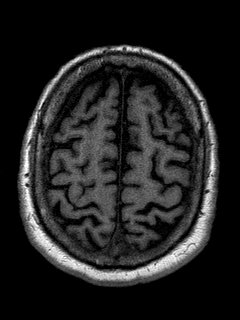
[im 132/144]
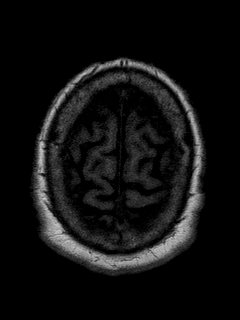
[im 144/144]
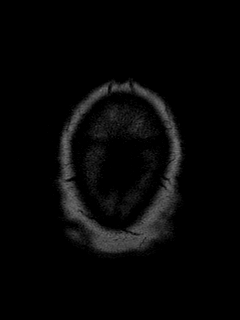

[Series 12: T2 · coronal · 5.0mm · 0.45mm/px · 2 of 25 slices shown (2 of 2)]
[im 1/25]
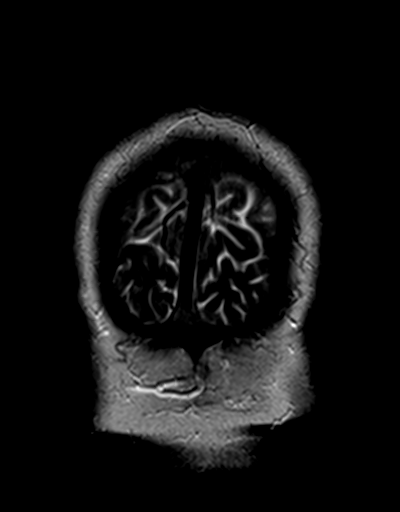
[im 25/25]
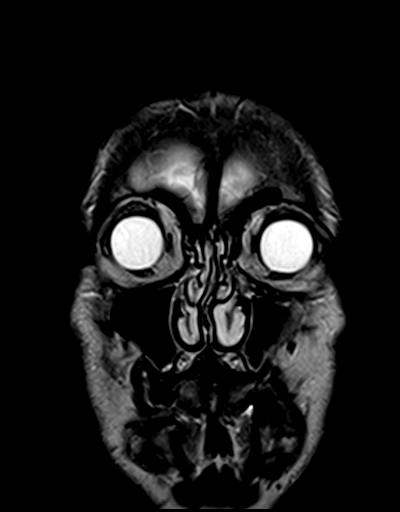

[48 of 48 positions shown; findings below may reference images not displayed]

FINDINGS: Brain: Diffusion imaging does not show any acute or subacute
infarction. No intrinsic abnormality of the brainstem or cerebellum.
There is an extensive pial dural arteriovenous malformation at the
base of the brain on the right affecting the inferior temporal lobe.
There is volume loss and gliosis affecting the inferior temporal
lobe. There is minimal of any hemosiderin deposition in the region.
Elsewhere, cerebral hemispheres show mild to moderate chronic
small-vessel ischemic changes of the deep and subcortical white
matter. No large vessel territory ischemic infarction. No neoplastic
mass lesion, hemorrhage, hydrocephalus or extra-axial collection.

Vascular: Major arterial and venous structures at the base of the
brain show flow. Details of the supply and drainage of this
arteriovenous malformation would be best achieved by imaging based
angiography or catheter angiography.

Skull and upper cervical spine: Negative

Sinuses/Orbits: Clear sinuses. Insignificant retention cyst in the
left sphenoid sinus. Orbits negative.

Other: None
IMPRESSION: Pial dural arteriovenous malformation at the base of the brain on
the right with involvement of the inferior temporal lobe. Mild
atrophy and gliosis in that region. There is not much if any
evidence of previous hemorrhage.

Moderate chronic small-vessel ischemic changes of the cerebral
hemispheric white matter otherwise.

These results will be called to the ordering clinician or
representative by the Radiologist Assistant, and communication
documented in the PACS or zVision Dashboard. As this is now [REDACTED]
night and the patient is not acutely ill and does not show any acute
imaging finding, the examination report can be called on [REDACTED]
morning.

## 2020-01-08 ENCOUNTER — Ambulatory Visit (INDEPENDENT_AMBULATORY_CARE_PROVIDER_SITE_OTHER): Payer: Medicare HMO | Admitting: Family Medicine

## 2020-01-08 ENCOUNTER — Other Ambulatory Visit: Payer: Self-pay

## 2020-01-08 ENCOUNTER — Encounter: Payer: Self-pay | Admitting: Family Medicine

## 2020-01-08 VITALS — BP 125/69 | HR 93 | Temp 97.4°F | Resp 20 | Ht 72.0 in | Wt 233.0 lb

## 2020-01-08 DIAGNOSIS — R197 Diarrhea, unspecified: Secondary | ICD-10-CM | POA: Diagnosis not present

## 2020-01-08 DIAGNOSIS — Z8546 Personal history of malignant neoplasm of prostate: Secondary | ICD-10-CM | POA: Diagnosis not present

## 2020-01-08 DIAGNOSIS — I951 Orthostatic hypotension: Secondary | ICD-10-CM | POA: Diagnosis not present

## 2020-01-08 DIAGNOSIS — E782 Mixed hyperlipidemia: Secondary | ICD-10-CM

## 2020-01-08 LAB — LIPID PANEL

## 2020-01-08 NOTE — Progress Notes (Signed)
Subjective:  Patient ID: JAYLENE SCHROM, male    DOB: 03-Apr-1951  Age: 69 y.o. MRN: [id number]  CC: Medical Management of Chronic Issues   HPI KLARK VANDERHOEF presents for recheck of his dizziness.  He still has it periodically.  However, he has not been to see the ENT yet and thinks he is getting go ahead with that.  Meanwhile he stopped using the midodrine and has not noticed any change with regard to dizziness.  He is having no syncope or falling.  The dizziness was unchanged and it is more of a room spinning vertigo type dizziness at this time.  Blood pressure is normal range today even without the midodrine.   Patient tells me for the last several months he has been having intermittent diarrhea he will be just fine for couple weeks then all of a sudden he will have some explosive diarrhea and he can even make it to the bathroom if he is out in his yard.  After that he will be normal again for couple weeks or so approximately and that will happen again.  No abdominal pain associated.  No hematemesis hematochezia or melena. Depression screen [facility] 2/9 01/08/2020 11/02/2019 07/11/2019  Decreased Interest 0 0 0  Down, Depressed, Hopeless 0 0 0  PHQ - 2 Score 0 0 0  Altered sleeping - - -  Tired, decreased energy - - -  Change in appetite - - -  Feeling bad or failure about yourself  - - -  Trouble concentrating - - -  Moving slowly or fidgety/restless - - -  Suicidal thoughts - - -  PHQ-9 Score - - -  Difficult doing work/chores - - -    History [place] has a past medical history of Cancer ([place]) (2011), COPD (chronic obstructive pulmonary disease) ([place]), Glaucoma, and Sleep apnea.   He has a past surgical history that includes Prostate surgery (2011) and Cystoscopy with urethral dilatation (N/A, 06/27/2018).   His family history includes Alcohol abuse in his brother; COPD in his brother; Heart disease in his maternal grandmother; Stroke in his mother.He reports that he has been smoking  cigars and cigarettes. He has a 13.75 pack-year smoking history. He has never used smokeless tobacco. He reports current alcohol use. He reports that he does not use drugs.    ROS Review of Systems  Constitutional: Negative.   HENT: Negative.   Eyes: Negative for visual disturbance.  Respiratory: Negative for cough and shortness of breath.   Cardiovascular: Negative for chest pain and leg swelling.  Gastrointestinal: Negative for abdominal pain, diarrhea, nausea and vomiting.  Genitourinary: Negative for difficulty urinating.  Musculoskeletal: Negative for arthralgias and myalgias.  Skin: Negative for rash.  Neurological: Negative for headaches.  Psychiatric/Behavioral: Negative for sleep disturbance.    Objective:  BP 125/69   Pulse 93   Temp (!) 97.4 F (36.3 C) (Temporal)   Resp 20   Ht 6' (1.829 m)   Wt 233 lb (105.7 kg)   SpO2 99%   BMI 31.60 kg/m   BP Readings from Last 3 Encounters:  01/08/20 125/69  07/11/19 96/66  04/24/19 (!) 147/87    Wt Readings from Last 3 Encounters:  01/08/20 233 lb (105.7 kg)  07/11/19 221 lb 3.2 oz (100.3 kg)  04/24/19 216 lb 3.2 oz (98.1 kg)     Physical Exam Vitals reviewed.  Constitutional:      Appearance: He is well-developed.  HENT:     Head: Normocephalic and  atraumatic.     Right Ear: External ear normal.     Left Ear: External ear normal.     Mouth/Throat:     Pharynx: No oropharyngeal exudate or posterior oropharyngeal erythema.  Eyes:     Pupils: Pupils are equal, round, and reactive to light.  Cardiovascular:     Rate and Rhythm: Normal rate and regular rhythm.     Heart sounds: No murmur.  Pulmonary:     Effort: No respiratory distress.     Breath sounds: Normal breath sounds.  Musculoskeletal:     Cervical back: Normal range of motion and neck supple.  Neurological:     Mental Status: He is alert and oriented to person, place, and time.       Assessment & Plan:   Shahram was seen today for medical  management of chronic issues.  Diagnoses and all orders for this visit:  Diarrhea, unspecified type -     CBC with Differential/Platelet -     CMP14+EGFR -     PSA Total (Reflex To Free) -     Cdiff NAA+O+P+Stool Culture -     Giardia/Cryptosporidium EIA  Orthostatic hypotension -     CBC with Differential/Platelet -     CMP14+EGFR  Mixed hyperlipidemia -     Lipid panel       [name] does not currently have medications on file.  Allergies as of 01/08/2020   No Known Allergies     Medication List    as of Jan 08, 2020 12:59 PM   You have not been prescribed any medications.      Follow-up: No follow-ups on file.  Claretta Fraise, M.D.

## 2020-01-09 ENCOUNTER — Other Ambulatory Visit: Payer: Medicare HMO

## 2020-01-09 ENCOUNTER — Other Ambulatory Visit: Payer: Self-pay

## 2020-01-09 DIAGNOSIS — R197 Diarrhea, unspecified: Secondary | ICD-10-CM | POA: Diagnosis not present

## 2020-01-09 LAB — CBC WITH DIFFERENTIAL/PLATELET
Basophils Absolute: 0.1 10*3/uL (ref 0.0–0.2)
Basos: 1 %
EOS (ABSOLUTE): 0.2 10*3/uL (ref 0.0–0.4)
Eos: 2 %
Hematocrit: 51.1 % — ABNORMAL HIGH (ref 37.5–51.0)
Hemoglobin: 17.5 g/dL (ref 13.0–17.7)
Immature Grans (Abs): 0 10*3/uL (ref 0.0–0.1)
Immature Granulocytes: 0 %
Lymphocytes Absolute: 1.5 10*3/uL (ref 0.7–3.1)
Lymphs: 23 %
MCH: 34.7 pg — ABNORMAL HIGH (ref 26.6–33.0)
MCHC: 34.2 g/dL (ref 31.5–35.7)
MCV: 101 fL — ABNORMAL HIGH (ref 79–97)
Monocytes Absolute: 1 10*3/uL — ABNORMAL HIGH (ref 0.1–0.9)
Monocytes: 15 %
Neutrophils Absolute: 3.8 10*3/uL (ref 1.4–7.0)
Neutrophils: 59 %
Platelets: 227 10*3/uL (ref 150–450)
RBC: 5.04 x10E6/uL (ref 4.14–5.80)
RDW: 12.4 % (ref 11.6–15.4)
WBC: 6.4 10*3/uL (ref 3.4–10.8)

## 2020-01-09 LAB — CMP14+EGFR
ALT: 9 IU/L (ref 0–44)
AST: 18 IU/L (ref 0–40)
Albumin/Globulin Ratio: 1.7 (ref 1.2–2.2)
Albumin: 4 g/dL (ref 3.8–4.8)
Alkaline Phosphatase: 83 IU/L (ref 39–117)
BUN/Creatinine Ratio: 8 — ABNORMAL LOW (ref 10–24)
BUN: 6 mg/dL — ABNORMAL LOW (ref 8–27)
Bilirubin Total: 0.6 mg/dL (ref 0.0–1.2)
CO2: 31 mmol/L — ABNORMAL HIGH (ref 20–29)
Calcium: 9.5 mg/dL (ref 8.6–10.2)
Chloride: 91 mmol/L — ABNORMAL LOW (ref 96–106)
Creatinine, Ser: 0.71 mg/dL — ABNORMAL LOW (ref 0.76–1.27)
GFR calc Af Amer: 110 mL/min/{1.73_m2} (ref >59)
GFR calc non Af Amer: 95 mL/min/{1.73_m2} (ref >59)
Globulin, Total: 2.4 g/dL (ref 1.5–4.5)
Glucose: 89 mg/dL (ref 65–99)
Potassium: 4.4 mmol/L (ref 3.5–5.2)
Sodium: 133 mmol/L — ABNORMAL LOW (ref 134–144)
Total Protein: 6.4 g/dL (ref 6.0–8.5)

## 2020-01-09 LAB — LIPID PANEL
Chol/HDL Ratio: 2.2 ratio (ref 0.0–5.0)
Cholesterol, Total: 188 mg/dL (ref 100–199)
HDL: 86 mg/dL (ref >39)
LDL Chol Calc (NIH): 90 mg/dL (ref 0–99)
Triglycerides: 66 mg/dL (ref 0–149)
VLDL Cholesterol Cal: 12 mg/dL (ref 5–40)

## 2020-01-09 LAB — PSA TOTAL (REFLEX TO FREE): Prostate Specific Ag, Serum: <0.1 ng/mL (ref 0.0–4.0)

## 2020-01-09 NOTE — Progress Notes (Signed)
Hello  Llewellyn,    Your lab result is normal and/or stable.Some minor variations that are not significant are commonly marked abnormal, but do not represent any medical problem for you.   Best regards,  Claretta Fraise, M.D.

## 2020-01-10 LAB — GIARDIA/CRYPTOSPORIDIUM EIA
Cryptosporidium EIA: NEGATIVE
Giardia Ag, Stl: NEGATIVE

## 2020-01-14 LAB — CDIFF NAA+O+P+STOOL CULTURE
E coli, Shiga toxin Assay: NEGATIVE
Toxigenic C. Difficile by PCR: NEGATIVE

## 2020-02-27 ENCOUNTER — Other Ambulatory Visit: Payer: Self-pay | Admitting: Family Medicine

## 2020-02-28 NOTE — Telephone Encounter (Signed)
Last OV 01/08/20 No current meds on file Ok to refill?

## 2020-05-25 ENCOUNTER — Emergency Department (HOSPITAL_COMMUNITY): Payer: Medicare HMO

## 2020-05-25 ENCOUNTER — Other Ambulatory Visit: Payer: Self-pay

## 2020-05-25 ENCOUNTER — Encounter (HOSPITAL_COMMUNITY): Payer: Self-pay | Admitting: Emergency Medicine

## 2020-05-25 ENCOUNTER — Inpatient Hospital Stay (HOSPITAL_COMMUNITY): Payer: Medicare HMO

## 2020-05-25 ENCOUNTER — Inpatient Hospital Stay (HOSPITAL_COMMUNITY)
Admission: EM | Admit: 2020-05-25 | Discharge: 2020-06-08 | DRG: 208 | Disposition: A | Discharge status: Skilled Nursing Facility | Payer: Medicare HMO | Attending: Internal Medicine | Admitting: Internal Medicine

## 2020-05-25 DIAGNOSIS — E871 Hypo-osmolality and hyponatremia: Secondary | ICD-10-CM

## 2020-05-25 DIAGNOSIS — F17290 Nicotine dependence, other tobacco product, uncomplicated: Secondary | ICD-10-CM | POA: Diagnosis present

## 2020-05-25 DIAGNOSIS — R0600 Dyspnea, unspecified: Secondary | ICD-10-CM

## 2020-05-25 DIAGNOSIS — N179 Acute kidney failure, unspecified: Secondary | ICD-10-CM | POA: Diagnosis present

## 2020-05-25 DIAGNOSIS — H409 Unspecified glaucoma: Secondary | ICD-10-CM | POA: Diagnosis present

## 2020-05-25 DIAGNOSIS — J811 Chronic pulmonary edema: Secondary | ICD-10-CM | POA: Diagnosis not present

## 2020-05-25 DIAGNOSIS — N35919 Unspecified urethral stricture, male, unspecified site: Secondary | ICD-10-CM | POA: Diagnosis present

## 2020-05-25 DIAGNOSIS — Z825 Family history of asthma and other chronic lower respiratory diseases: Secondary | ICD-10-CM

## 2020-05-25 DIAGNOSIS — F1010 Alcohol abuse, uncomplicated: Secondary | ICD-10-CM | POA: Diagnosis present

## 2020-05-25 DIAGNOSIS — I10 Essential (primary) hypertension: Secondary | ICD-10-CM | POA: Diagnosis present

## 2020-05-25 DIAGNOSIS — R0602 Shortness of breath: Secondary | ICD-10-CM | POA: Diagnosis not present

## 2020-05-25 DIAGNOSIS — C61 Malignant neoplasm of prostate: Secondary | ICD-10-CM | POA: Diagnosis not present

## 2020-05-25 DIAGNOSIS — E876 Hypokalemia: Secondary | ICD-10-CM | POA: Diagnosis not present

## 2020-05-25 DIAGNOSIS — J9602 Acute respiratory failure with hypercapnia: Secondary | ICD-10-CM

## 2020-05-25 DIAGNOSIS — I5033 Acute on chronic diastolic (congestive) heart failure: Secondary | ICD-10-CM | POA: Diagnosis present

## 2020-05-25 DIAGNOSIS — J90 Pleural effusion, not elsewhere classified: Secondary | ICD-10-CM | POA: Diagnosis not present

## 2020-05-25 DIAGNOSIS — I517 Cardiomegaly: Secondary | ICD-10-CM | POA: Diagnosis not present

## 2020-05-25 DIAGNOSIS — Z720 Tobacco use: Secondary | ICD-10-CM | POA: Diagnosis present

## 2020-05-25 DIAGNOSIS — J9811 Atelectasis: Secondary | ICD-10-CM | POA: Diagnosis not present

## 2020-05-25 DIAGNOSIS — F17210 Nicotine dependence, cigarettes, uncomplicated: Secondary | ICD-10-CM | POA: Diagnosis present

## 2020-05-25 DIAGNOSIS — J9622 Acute and chronic respiratory failure with hypercapnia: Secondary | ICD-10-CM | POA: Diagnosis not present

## 2020-05-25 DIAGNOSIS — R296 Repeated falls: Secondary | ICD-10-CM | POA: Diagnosis present

## 2020-05-25 DIAGNOSIS — E2740 Unspecified adrenocortical insufficiency: Secondary | ICD-10-CM | POA: Diagnosis present

## 2020-05-25 DIAGNOSIS — Z9119 Patient's noncompliance with other medical treatment and regimen: Secondary | ICD-10-CM

## 2020-05-25 DIAGNOSIS — J439 Emphysema, unspecified: Secondary | ICD-10-CM | POA: Diagnosis not present

## 2020-05-25 DIAGNOSIS — J9621 Acute and chronic respiratory failure with hypoxia: Secondary | ICD-10-CM | POA: Diagnosis not present

## 2020-05-25 DIAGNOSIS — R0601 Orthopnea: Secondary | ICD-10-CM | POA: Diagnosis not present

## 2020-05-25 DIAGNOSIS — G9341 Metabolic encephalopathy: Secondary | ICD-10-CM | POA: Diagnosis not present

## 2020-05-25 DIAGNOSIS — J9600 Acute respiratory failure, unspecified whether with hypoxia or hypercapnia: Secondary | ICD-10-CM

## 2020-05-25 DIAGNOSIS — Q282 Arteriovenous malformation of cerebral vessels: Secondary | ICD-10-CM

## 2020-05-25 DIAGNOSIS — R42 Dizziness and giddiness: Secondary | ICD-10-CM | POA: Diagnosis not present

## 2020-05-25 DIAGNOSIS — J441 Chronic obstructive pulmonary disease with (acute) exacerbation: Secondary | ICD-10-CM | POA: Diagnosis not present

## 2020-05-25 DIAGNOSIS — Z7951 Long term (current) use of inhaled steroids: Secondary | ICD-10-CM

## 2020-05-25 DIAGNOSIS — Z87440 Personal history of urinary (tract) infections: Secondary | ICD-10-CM

## 2020-05-25 DIAGNOSIS — J9691 Respiratory failure, unspecified with hypoxia: Secondary | ICD-10-CM | POA: Insufficient documentation

## 2020-05-25 DIAGNOSIS — I951 Orthostatic hypotension: Secondary | ICD-10-CM | POA: Diagnosis present

## 2020-05-25 DIAGNOSIS — E782 Mixed hyperlipidemia: Secondary | ICD-10-CM | POA: Diagnosis present

## 2020-05-25 DIAGNOSIS — W19XXXA Unspecified fall, initial encounter: Secondary | ICD-10-CM | POA: Diagnosis not present

## 2020-05-25 DIAGNOSIS — Z8249 Family history of ischemic heart disease and other diseases of the circulatory system: Secondary | ICD-10-CM

## 2020-05-25 DIAGNOSIS — I5023 Acute on chronic systolic (congestive) heart failure: Secondary | ICD-10-CM | POA: Diagnosis not present

## 2020-05-25 DIAGNOSIS — J449 Chronic obstructive pulmonary disease, unspecified: Secondary | ICD-10-CM | POA: Diagnosis not present

## 2020-05-25 DIAGNOSIS — I5031 Acute diastolic (congestive) heart failure: Secondary | ICD-10-CM

## 2020-05-25 DIAGNOSIS — I509 Heart failure, unspecified: Secondary | ICD-10-CM

## 2020-05-25 DIAGNOSIS — J9690 Respiratory failure, unspecified, unspecified whether with hypoxia or hypercapnia: Secondary | ICD-10-CM | POA: Diagnosis not present

## 2020-05-25 DIAGNOSIS — R0902 Hypoxemia: Secondary | ICD-10-CM | POA: Diagnosis not present

## 2020-05-25 DIAGNOSIS — R4182 Altered mental status, unspecified: Secondary | ICD-10-CM | POA: Diagnosis not present

## 2020-05-25 DIAGNOSIS — Z4659 Encounter for fitting and adjustment of other gastrointestinal appliance and device: Secondary | ICD-10-CM

## 2020-05-25 DIAGNOSIS — Z0189 Encounter for other specified special examinations: Secondary | ICD-10-CM

## 2020-05-25 DIAGNOSIS — E878 Other disorders of electrolyte and fluid balance, not elsewhere classified: Secondary | ICD-10-CM | POA: Diagnosis present

## 2020-05-25 DIAGNOSIS — I110 Hypertensive heart disease with heart failure: Secondary | ICD-10-CM | POA: Diagnosis present

## 2020-05-25 DIAGNOSIS — Z811 Family history of alcohol abuse and dependence: Secondary | ICD-10-CM

## 2020-05-25 DIAGNOSIS — I959 Hypotension, unspecified: Secondary | ICD-10-CM | POA: Diagnosis present

## 2020-05-25 DIAGNOSIS — J9601 Acute respiratory failure with hypoxia: Secondary | ICD-10-CM

## 2020-05-25 DIAGNOSIS — I498 Other specified cardiac arrhythmias: Secondary | ICD-10-CM | POA: Diagnosis not present

## 2020-05-25 DIAGNOSIS — Z20822 Contact with and (suspected) exposure to covid-19: Secondary | ICD-10-CM | POA: Diagnosis present

## 2020-05-25 DIAGNOSIS — G4733 Obstructive sleep apnea (adult) (pediatric): Secondary | ICD-10-CM | POA: Diagnosis not present

## 2020-05-25 LAB — CBC WITH DIFFERENTIAL/PLATELET
Abs Immature Granulocytes: 0.01 10*3/uL (ref 0.00–0.07)
Basophils Absolute: 0.1 10*3/uL (ref 0.0–0.1)
Basophils Relative: 1 %
Eosinophils Absolute: 0 10*3/uL (ref 0.0–0.5)
Eosinophils Relative: 1 %
HCT: 52.7 % — ABNORMAL HIGH (ref 39.0–52.0)
Hemoglobin: 16.7 g/dL (ref 13.0–17.0)
Immature Granulocytes: 0 %
Lymphocytes Relative: 17 %
Lymphs Abs: 1.2 10*3/uL (ref 0.7–4.0)
MCH: 31.4 pg (ref 26.0–34.0)
MCHC: 31.7 g/dL (ref 30.0–36.0)
MCV: 99.1 fL (ref 80.0–100.0)
Monocytes Absolute: 1.3 10*3/uL — ABNORMAL HIGH (ref 0.1–1.0)
Monocytes Relative: 17 %
Neutro Abs: 4.8 10*3/uL (ref 1.7–7.7)
Neutrophils Relative %: 64 %
Platelets: 238 10*3/uL (ref 150–400)
RBC: 5.32 MIL/uL (ref 4.22–5.81)
RDW: 15.6 % — ABNORMAL HIGH (ref 11.5–15.5)
WBC: 7.4 10*3/uL (ref 4.0–10.5)
nRBC: 0.3 % — ABNORMAL HIGH (ref 0.0–0.2)

## 2020-05-25 LAB — BLOOD GAS, ARTERIAL
Acid-Base Excess: 5.9 mmol/L — ABNORMAL HIGH (ref 0.0–2.0)
Acid-Base Excess: 7.2 mmol/L — ABNORMAL HIGH (ref 0.0–2.0)
Bicarbonate: 24.7 mmol/L (ref 20.0–28.0)
Bicarbonate: 29.9 mmol/L — ABNORMAL HIGH (ref 20.0–28.0)
FIO2: 100
FIO2: 100
O2 Saturation: 97.1 %
O2 Saturation: 94.1 %
Patient temperature: 37
Patient temperature: 37
pCO2 arterial: 103 mmHg (ref 32.0–48.0)
pCO2 arterial: 50.9 mmHg — ABNORMAL HIGH (ref 32.0–48.0)
pH, Arterial: 7.149 — CL (ref 7.350–7.450)
pH, Arterial: 7.413 (ref 7.350–7.450)
pO2, Arterial: 140 mmHg — ABNORMAL HIGH (ref 83.0–108.0)
pO2, Arterial: 74.5 mmHg — ABNORMAL LOW (ref 83.0–108.0)

## 2020-05-25 LAB — LACTIC ACID, PLASMA
Lactic Acid, Venous: 1 mmol/L (ref 0.5–1.9)
Lactic Acid, Venous: 0.9 mmol/L (ref 0.5–1.9)

## 2020-05-25 LAB — URINALYSIS, ROUTINE W REFLEX MICROSCOPIC
Bilirubin Urine: NEGATIVE
Glucose, UA: NEGATIVE mg/dL
Ketones, ur: 5 mg/dL — AB
Nitrite: NEGATIVE
Protein, ur: NEGATIVE mg/dL
Specific Gravity, Urine: 1.004 — ABNORMAL LOW (ref 1.005–1.030)
pH: 6 (ref 5.0–8.0)

## 2020-05-25 LAB — COMPREHENSIVE METABOLIC PANEL
ALT: 15 U/L (ref 0–44)
AST: 22 U/L (ref 15–41)
Albumin: 3.4 g/dL — ABNORMAL LOW (ref 3.5–5.0)
Alkaline Phosphatase: 62 U/L (ref 38–126)
Anion gap: 10 (ref 5–15)
BUN: 58 mg/dL — ABNORMAL HIGH (ref 8–23)
CO2: 29 mmol/L (ref 22–32)
Calcium: 8.5 mg/dL — ABNORMAL LOW (ref 8.9–10.3)
Chloride: 89 mmol/L — ABNORMAL LOW (ref 98–111)
Creatinine, Ser: 1.49 mg/dL — ABNORMAL HIGH (ref 0.61–1.24)
GFR calc Af Amer: 54 mL/min — ABNORMAL LOW (ref >60)
GFR calc non Af Amer: 47 mL/min — ABNORMAL LOW (ref >60)
Glucose, Bld: 100 mg/dL — ABNORMAL HIGH (ref 70–99)
Potassium: 4.7 mmol/L (ref 3.5–5.1)
Sodium: 128 mmol/L — ABNORMAL LOW (ref 135–145)
Total Bilirubin: 1.3 mg/dL — ABNORMAL HIGH (ref 0.3–1.2)
Total Protein: 6.5 g/dL (ref 6.5–8.1)

## 2020-05-25 LAB — RESPIRATORY PANEL BY RT PCR (FLU A&B, COVID)
Influenza A by PCR: NEGATIVE
Influenza B by PCR: NEGATIVE
SARS Coronavirus 2 by RT PCR: NEGATIVE

## 2020-05-25 LAB — GLUCOSE, CAPILLARY
Glucose-Capillary: 102 mg/dL — ABNORMAL HIGH (ref 70–99)
Glucose-Capillary: 99 mg/dL (ref 70–99)

## 2020-05-25 LAB — TROPONIN I (HIGH SENSITIVITY)
Troponin I (High Sensitivity): 13 ng/L (ref <18)
Troponin I (High Sensitivity): 13 ng/L (ref <18)

## 2020-05-25 LAB — TRIGLYCERIDES: Triglycerides: 81 mg/dL (ref <150)

## 2020-05-25 LAB — BRAIN NATRIURETIC PEPTIDE: B Natriuretic Peptide: 455 pg/mL — ABNORMAL HIGH (ref 0.0–100.0)

## 2020-05-25 LAB — MRSA PCR SCREENING: MRSA by PCR: NEGATIVE

## 2020-05-25 MED ORDER — SUCCINYLCHOLINE CHLORIDE 20 MG/ML IJ SOLN
100.0000 mg | Freq: Once | INTRAMUSCULAR | Status: AC
  Administered 2020-05-25: 100 mg via INTRAVENOUS

## 2020-05-25 MED ORDER — PROPOFOL 1000 MG/100ML IV EMUL
0.0000 ug/kg/min | INTRAVENOUS | Status: DC
  Administered 2020-05-25: 5 ug/kg/min via INTRAVENOUS
  Administered 2020-05-25: 45 ug/kg/min via INTRAVENOUS
  Administered 2020-05-27 – 2020-05-28 (×4): 35 ug/kg/min via INTRAVENOUS
  Filled 2020-05-25 (×13): qty 100

## 2020-05-25 MED ORDER — ORAL CARE MOUTH RINSE
15.0000 mL | OROMUCOSAL | Status: DC
  Administered 2020-05-25 – 2020-05-29 (×35): 15 mL via OROMUCOSAL

## 2020-05-25 MED ORDER — ETOMIDATE 2 MG/ML IV SOLN
20.0000 mg | Freq: Once | INTRAVENOUS | Status: AC
  Administered 2020-05-25: 20 mg via INTRAVENOUS

## 2020-05-25 MED ORDER — FENTANYL CITRATE (PF) 100 MCG/2ML IJ SOLN
25.0000 ug | INTRAMUSCULAR | Status: DC | PRN
  Administered 2020-05-25: 100 ug via INTRAVENOUS
  Administered 2020-05-29: 25 ug via INTRAVENOUS
  Filled 2020-05-25 (×2): qty 2

## 2020-05-25 MED ORDER — IPRATROPIUM-ALBUTEROL 0.5-2.5 (3) MG/3ML IN SOLN
3.0000 mL | RESPIRATORY_TRACT | Status: DC | PRN
  Administered 2020-05-25 – 2020-05-28 (×4): 3 mL via RESPIRATORY_TRACT
  Filled 2020-05-25 (×5): qty 3

## 2020-05-25 MED ORDER — CHLORHEXIDINE GLUCONATE CLOTH 2 % EX PADS
6.0000 | MEDICATED_PAD | Freq: Every day | CUTANEOUS | Status: DC
  Administered 2020-05-26 – 2020-06-07 (×11): 6 via TOPICAL

## 2020-05-25 MED ORDER — FENTANYL CITRATE (PF) 100 MCG/2ML IJ SOLN
25.0000 ug | INTRAMUSCULAR | Status: DC | PRN
  Administered 2020-05-25 (×2): 25 ug via INTRAVENOUS
  Filled 2020-05-25: qty 2

## 2020-05-25 MED ORDER — DOCUSATE SODIUM 50 MG/5ML PO LIQD
100.0000 mg | Freq: Two times a day (BID) | ORAL | Status: DC
  Filled 2020-05-25 (×3): qty 10

## 2020-05-25 MED ORDER — POLYETHYLENE GLYCOL 3350 17 G PO PACK
17.0000 g | PACK | Freq: Every day | ORAL | Status: DC
  Administered 2020-05-26 – 2020-05-28 (×2): 17 g via ORAL
  Filled 2020-05-25 (×6): qty 1

## 2020-05-25 MED ORDER — ONDANSETRON HCL 4 MG/2ML IJ SOLN: 4.0000 mg | Freq: Four times a day (QID) | INTRAMUSCULAR | Status: DC | PRN

## 2020-05-25 MED ORDER — CHLORHEXIDINE GLUCONATE 0.12% ORAL RINSE (MEDLINE KIT)
15.0000 mL | Freq: Two times a day (BID) | OROMUCOSAL | Status: DC
  Administered 2020-05-25 – 2020-06-07 (×18): 15 mL via OROMUCOSAL

## 2020-05-25 MED ORDER — METHYLPREDNISOLONE SODIUM SUCC 125 MG IJ SOLR
125.0000 mg | Freq: Once | INTRAMUSCULAR | Status: AC
  Administered 2020-05-25: 125 mg via INTRAVENOUS
  Filled 2020-05-25: qty 2

## 2020-05-25 MED ORDER — DOCUSATE SODIUM 50 MG/5ML PO LIQD
100.0000 mg | Freq: Two times a day (BID) | ORAL | Status: DC
  Administered 2020-05-25 – 2020-05-28 (×7): 100 mg
  Filled 2020-05-25 (×8): qty 10

## 2020-05-25 MED ORDER — DOXYCYCLINE HYCLATE 100 MG PO TABS
100.0000 mg | ORAL_TABLET | Freq: Two times a day (BID) | ORAL | Status: DC
  Filled 2020-05-25: qty 1

## 2020-05-25 MED ORDER — IPRATROPIUM-ALBUTEROL 20-100 MCG/ACT IN AERS: 1.0000 | INHALATION_SPRAY | Freq: Four times a day (QID) | RESPIRATORY_TRACT | Status: DC

## 2020-05-25 MED ORDER — ALBUTEROL SULFATE HFA 108 (90 BASE) MCG/ACT IN AERS: 4.0000 | INHALATION_SPRAY | RESPIRATORY_TRACT | Status: DC | PRN

## 2020-05-25 MED ORDER — METHYLPREDNISOLONE SODIUM SUCC 40 MG IJ SOLR
40.0000 mg | Freq: Three times a day (TID) | INTRAMUSCULAR | Status: DC
  Administered 2020-05-25 – 2020-05-26 (×2): 40 mg via INTRAVENOUS
  Filled 2020-05-25 (×2): qty 1

## 2020-05-25 MED ORDER — HEPARIN SODIUM (PORCINE) 5000 UNIT/ML IJ SOLN
5000.0000 [IU] | Freq: Three times a day (TID) | INTRAMUSCULAR | Status: DC
  Administered 2020-05-25 – 2020-06-07 (×39): 5000 [IU] via SUBCUTANEOUS
  Filled 2020-05-25 (×39): qty 1

## 2020-05-25 MED ORDER — PROPOFOL 1000 MG/100ML IV EMUL
0.0000 ug/kg/min | INTRAVENOUS | Status: DC
  Administered 2020-05-25: 50 ug/kg/min via INTRAVENOUS
  Administered 2020-05-25 – 2020-05-26 (×4): 40 ug/kg/min via INTRAVENOUS
  Administered 2020-05-26: 50 ug/kg/min via INTRAVENOUS
  Administered 2020-05-26: 40 ug/kg/min via INTRAVENOUS
  Administered 2020-05-26: 50 ug/kg/min via INTRAVENOUS
  Administered 2020-05-26: 40 ug/kg/min via INTRAVENOUS
  Administered 2020-05-27: 35 ug/kg/min via INTRAVENOUS
  Administered 2020-05-27: 30 ug/kg/min via INTRAVENOUS
  Administered 2020-05-28 (×2): 40 ug/kg/min via INTRAVENOUS
  Administered 2020-05-28: 35 ug/kg/min via INTRAVENOUS
  Administered 2020-05-29 (×2): 40 ug/kg/min via INTRAVENOUS
  Filled 2020-05-25 (×11): qty 100

## 2020-05-25 MED ORDER — ONDANSETRON HCL 4 MG PO TABS: 4.0000 mg | ORAL_TABLET | Freq: Four times a day (QID) | ORAL | Status: DC | PRN

## 2020-05-25 MED ORDER — SODIUM CHLORIDE 0.9% FLUSH
3.0000 mL | Freq: Two times a day (BID) | INTRAVENOUS | Status: DC
  Administered 2020-05-28 – 2020-06-06 (×13): 3 mL via INTRAVENOUS

## 2020-05-25 MED ORDER — ACETAMINOPHEN 160 MG/5ML PO SOLN
325.0000 mg | Freq: Four times a day (QID) | ORAL | Status: DC
  Administered 2020-05-25 – 2020-05-29 (×15): 325 mg
  Filled 2020-05-25 (×15): qty 20.3

## 2020-05-25 MED ORDER — SODIUM CHLORIDE 0.9% FLUSH
3.0000 mL | Freq: Two times a day (BID) | INTRAVENOUS | Status: DC
  Administered 2020-05-25 – 2020-06-07 (×21): 3 mL via INTRAVENOUS

## 2020-05-25 MED ORDER — SODIUM CHLORIDE 0.9 % IV SOLN: 250.0000 mL | INTRAVENOUS | Status: DC | PRN

## 2020-05-25 MED ORDER — SODIUM CHLORIDE 0.9 % IV SOLN
100.0000 mg | Freq: Two times a day (BID) | INTRAVENOUS | Status: DC
  Administered 2020-05-25 – 2020-05-30 (×10): 100 mg via INTRAVENOUS
  Filled 2020-05-25 (×13): qty 100

## 2020-05-25 MED ORDER — FAMOTIDINE IN NACL 20-0.9 MG/50ML-% IV SOLN
20.0000 mg | INTRAVENOUS | Status: DC
  Administered 2020-05-25 – 2020-06-02 (×9): 20 mg via INTRAVENOUS
  Filled 2020-05-25 (×9): qty 50

## 2020-05-25 MED ORDER — FENTANYL 2500MCG IN NS 250ML (10MCG/ML) PREMIX INFUSION
0.0000 ug/h | INTRAVENOUS | Status: DC
  Administered 2020-05-25: 25 ug/h via INTRAVENOUS
  Administered 2020-05-27: 50 ug/h via INTRAVENOUS
  Administered 2020-05-29: 25 ug/h via INTRAVENOUS
  Filled 2020-05-25 (×3): qty 250

## 2020-05-25 MED ORDER — SODIUM CHLORIDE 0.9% FLUSH: 3.0000 mL | INTRAVENOUS | Status: DC | PRN

## 2020-05-25 MED ORDER — FUROSEMIDE 10 MG/ML IJ SOLN
40.0000 mg | Freq: Two times a day (BID) | INTRAMUSCULAR | Status: DC
  Administered 2020-05-25 – 2020-05-27 (×4): 40 mg via INTRAVENOUS
  Filled 2020-05-25 (×5): qty 4

## 2020-05-25 NOTE — H&P (Signed)
Patient Demographics:    Steven Howard, is a 69 y.o. male  MRN: [id number]   DOB - [birth year]  Admit Date - 05/25/2020  Outpatient Primary MD for the patient is Claretta Fraise, MD   Assessment & Plan:    Principal Problem:   Acute respiratory failure with hypoxia and hypercapnia ([place]) Active Problems:   Acute exacerbation of CHF (congestive heart failure) ([place])   COPD with acute exacerbation (HCC)   OSA and COPD overlap syndrome ([place])   Hyponatremia   Orthostatic hypotension   Essential hypertension   Alcohol abuse   Sleeps in sitting position due to orthopnea   Nicotine abuse   COPD (chronic obstructive pulmonary disease)/Emphysema    1) acute hypoxic and hypercapnic respiratory failure secondary to combination of CHF and COPD exacerbation--- --chest x-ray with cardiomegaly and pulmonary venous congestion and trace bilateral pleural effusions, BNP elevated at 455 -- ABG with a pH of 7.149, PCO2 of 103, PO2 of 140-=--ABG was done on 100% FiO2 after couple of hours of continuous BiPAP use---patient is now intubated and sedated -  2) acute CHF exacerbation---- ??  Systolic versus diastolic -Echo pending ---chest x-ray with cardiomegaly and pulmonary venous congestion and trace bilateral pleural effusions, BNP elevated at 455 -Troponin 13 -IV Lasix as ordered  3) COPD/OSA overlap syndrome----with acute COPD exacerbation -- ABG with a pH of 7.149, PCO2 of 103, PO2 of 140-=--ABG was done on 100% FiO2 after couple of hours of continuous BiPAP use -IV steroids, doxycycline and bronchodilators as ordered =-Currently intubated and sedated  4) acute metabolic encephalopathy--- altered mentation is most likely due to hypercapnia with PCO2 of 103 despite BiPAP use --CT head without contrast ordered and  pending--due to unwitnessed fall with altered mentation  5)Social/Ethics--- -At the time of my evaluation patient is orally intubated and sedated by EDP -History obtained from patient's sister Ms Marguerite Olea and ED records and EDP --Patient is a full code -Unable to reach son Quita Skye at this time, patient sister Ms. Wynonia Lawman will reach out to patient's son  6)AKI----acute kidney injury  -   creatinine on admission= 1.49 , baseline creatinine = 0.7 (01/08/20)   , --renally adjust medications, avoid nephrotoxic agents / dehydration  / hypotension  7) hyponatremia/hypochloremia---sodium is 128, chloride is 89 ??  [place] -Monitor electrolytes closely with IV diuresis for CHF exacerbation  8) alcohol and tobacco abuse--- currently intubated and sedated will need nicotine patch post extubation  9) history of orthostatic hypotension and recurrent falls----previously was on midodrine -Check a.m. cortisol levels  10)DAVM--- MR brain 05/25/2019: 1. 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 Mild atrophy and gliosis in that region. There is not much if any evidence of previous hemorrhage. 2. Moderate chronic small-vessel ischemic changes of the cerebral hemispheric white matter otherwise. NIR consulted , they recommended image-guided diagnostic cerebral arteriogram prior to intervention ---  which patient declined  --Prophylaxis--IV Pepcid for GI prophylaxis while intubated and on steroids, subcu heparin for DVT prophylaxis  CRITICAL CARE Performed by: Roxan Hockey  Total critical care time: 76 minutes  Critical care time was exclusive of separately billable procedures and treating other patients.  Vent Settings:- PRVC/100%/5/24/620 -Continue IV propofol and IV fentanyl drip for sedation  Critical care was necessary to treat or prevent imminent or life-threatening deterioration.  Critical care was time  spent personally by me on the following activities: development of treatment plan with patient and/or surrogate as well as nursing, discussions with consultants, evaluation of patient's response to treatment, examination of patient, obtaining history from patient or surrogate, ordering and performing treatments and interventions, ordering and review of laboratory studies, ordering and review of radiographic studies, pulse oximetry and re-evaluation of patient's condition.   Disposition/Need for in-Hospital Stay- patient unable to be discharged at this time due to --intubated and sedated--- requiring ventilator management due to hypercapnic and hypoxic respiratory failure  Status is: Inpatient  Remains inpatient appropriate because:intubated and sedated--- requiring ventilator management due to hypercapnic and hypoxic respiratory failure   Dispo: The patient is from: Home              Anticipated d/c is to: TBD              Anticipated d/c date is: > 3 days              Patient currently is not medically stable to d/c. Barriers: Not Clinically Stable- intubated and sedated--- requiring ventilator management due to hypercapnic and hypoxic respiratory failure  With History of - Reviewed by me  Past Medical History:  Diagnosis Date  . Cancer [facility]) 2011   Prostate  . COPD (chronic obstructive pulmonary disease) ([place])   . Glaucoma   . Sleep apnea       Past Surgical History:  Procedure Laterality Date  . 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 PROSTATE SURGERY  2011   seeds implanted/radiation      Chief Complaint  Patient presents with  . Dizziness      HPI:    Steven Howard  is a 69 y.o. 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 Pt. Relates that he was told that it could be surgically deflated, which patient declined due to concerns about some risk of stroke associated with this procedure--- presents today to the ED after falling at home and found to have altered mentation with persistent hypercapnia despite BiPAP use in the ED --- -At the time of my evaluation patient is orally intubated and sedated by EDP -History obtained from patient's sister Ms Marguerite Olea and ED records and EDP -- -CT head without contrast ordered and pending--due to unwitnessed fall with altered mentation  - Chest x-ray with ET tube and NG tube, also shows cardiomegaly with pulmonary venous congestion with trace bilateral pleural effusions --BNP is 455, no baseline available, troponin is 13 -CBC essentially WNL, lactic acid 1.0 -Chemistry with a  sodium of 128 chloride of 89, glucose 100, LFTs noted -Creatinine up to 1.49 from baseline of 0.7 - ABG with a pH of 7.149, PCO2 of 103, PO2 of 140-=--ABG was done on 100% FiO2 after couple of hours of continuous BiPAP use   Review of systems:    In addition to the HPI above,   A full Review of  Systems was done, all other systems reviewed are negative except as noted above in HPI , .    Social History:  Reviewed by me    Social History   Tobacco Use  . Smoking status: Current Every Day Smoker    Packs/day: 0.25    Years: 55.00    Pack years: 13.75    Types: Cigars, Cigarettes  . Smokeless tobacco: Never Used  Substance Use Topics  . Alcohol use: Yes    Comment: occasional       Family History :  Reviewed by me    Family History  Problem Relation Age  of Onset  . Stroke Mother   . Alcohol abuse Brother   . Heart disease Maternal Grandmother   . COPD Brother      Home Medications:   Prior to Admission medications   Medication Sig Start Date End Date Taking? Authorizing Provider  BREO ELLIPTA 100-25 MCG/INH AEPB INHALE 1 PUFF INTO LUNGS DAILY 02/28/20   Claretta Fraise, MD     Allergies:    No Known Allergies   Physical Exam:   Vitals  Blood pressure 122/82, pulse 79, temperature 98 F (36.7 C), temperature source Oral, resp. rate 19, height 6' (1.829 m), weight 105.7 kg, SpO2 97 %.  Physical Examination: General appearance -intubated and sedated  mental status -intubated and sedated  eyes - sclera anicteric HEENT-ET and NG tube Neck -   no JVD elevation , Chest -diminished breath sounds with few scattered wheezes Heart - S1 and S2 normal, regular  Abdomen - soft,  nondistended, +BS Neurological -limited exam as patient is intubated and sedated  extremities - no pedal edema noted, intact peripheral pulses  Skin - warm, dry GU-Foley catheter in situ     Data Review:    CBC Recent Labs  Lab 05/25/20 0830  WBC 7.4  HGB 16.7  HCT 52.7*  PLT 238  MCV 99.1  MCH 31.4  MCHC 31.7  RDW 15.6*  LYMPHSABS 1.2  MONOABS 1.3*  EOSABS 0.0  BASOSABS 0.1   ------------------------------------------------------------------------------------------------------------------  Chemistries  Recent Labs  Lab 05/25/20 0830  NA 128*  K 4.7  CL 89*  CO2 29  GLUCOSE 100*  BUN 58*  CREATININE 1.49*  CALCIUM 8.5*  AST 22  ALT 15  ALKPHOS 62  BILITOT 1.3*   ------------------------------------------------------------------------------------------------------------------ estimated creatinine clearance is 57.1 mL/min (A) (by C-G formula based on SCr of 1.49 mg/dL (H)). ------------------------------------------------------------------------------------------------------------------ No results for input(s): TSH, T4TOTAL,  T3FREE, THYROIDAB in the last 72 hours.  Invalid input(s): FREET3   Coagulation profile No results for input(s): INR, PROTIME in the last 168 hours. ------------------------------------------------------------------------------------------------------------------- No results for input(s): DDIMER in the last 72 hours. -------------------------------------------------------------------------------------------------------------------  Cardiac Enzymes No results for input(s): CKMB, TROPONINI, MYOGLOBIN in the last 168 hours.  Invalid input(s): CK ------------------------------------------------------------------------------------------------------------------    Component Value Date/Time   BNP 455.0 (H) 05/25/2020 0830     ---------------------------------------------------------------------------------------------------------------  Urinalysis No results found for: COLORURINE, APPEARANCEUR, LABSPEC, PHURINE, GLUCOSEU, HGBUR, BILIRUBINUR, KETONESUR, PROTEINUR, UROBILINOGEN, NITRITE, LEUKOCYTESUR  ----------------------------------------------------------------------------------------------------------------   Imaging Results:    DG Chest Portable 1  View  Result Date: 05/25/2020 CLINICAL DATA:  Shortness of breath.  Intubation. EXAM: PORTABLE CHEST 1 VIEW COMPARISON:  05/25/2020 chest radiograph FINDINGS: An endotracheal tube is identified with tip 4.5 cm above the carina. An NG tube is present entering the UPPER abdomen with tip off the field of view. Pulmonary vascular congestion and possible bilateral trace pleural effusions again noted. No other changes identified. IMPRESSION: 1. Endotracheal tube with tip 4.5 cm above the carina. NG tube placement. 2. Pulmonary vascular congestion and possible trace bilateral pleural effusions. Electronically Signed   By: Margarette Canada M.D.   On: 05/25/2020 11:43   DG Chest Port 1 View  Result Date: 05/25/2020 CLINICAL DATA:  69 year old male with  acute shortness of breath. EXAM: PORTABLE CHEST 1 VIEW COMPARISON:  None. FINDINGS: Cardiomegaly identified. Pulmonary vascular congestion is present with possible trace effusions. There is no evidence of pneumothorax, mass or consolidation. No acute bony abnormalities are present. IMPRESSION: Cardiomegaly with pulmonary vascular congestion and possible trace effusions. Electronically Signed   By: Margarette Canada M.D.   On: 05/25/2020 09:24    Radiological Exams on Admission: DG Chest Portable 1 View  Result Date: 05/25/2020 CLINICAL DATA:  Shortness of breath.  Intubation. EXAM: PORTABLE CHEST 1 VIEW COMPARISON:  05/25/2020 chest radiograph FINDINGS: An endotracheal tube is identified with tip 4.5 cm above the carina. An NG tube is present entering the UPPER abdomen with tip off the field of view. Pulmonary vascular congestion and possible bilateral trace pleural effusions again noted. No other changes identified. IMPRESSION: 1. Endotracheal tube with tip 4.5 cm above the carina. NG tube placement. 2. Pulmonary vascular congestion and possible trace bilateral pleural effusions. Electronically Signed   By: Margarette Canada M.D.   On: 05/25/2020 11:43   DG Chest Port 1 View  Result Date: 05/25/2020 CLINICAL DATA:  69 year old male with acute shortness of breath. EXAM: PORTABLE CHEST 1 VIEW COMPARISON:  None. FINDINGS: Cardiomegaly identified. Pulmonary vascular congestion is present with possible trace effusions. There is no evidence of pneumothorax, mass or consolidation. No acute bony abnormalities are present. IMPRESSION: Cardiomegaly with pulmonary vascular congestion and possible trace effusions. Electronically Signed   By: Margarette Canada M.D.   On: 05/25/2020 09:24    DVT Prophylaxis -SCD /heparin AM Labs Ordered, also please review Full Orders  Family Communication: Admission, patients condition and plan of care including tests being ordered have been discussed with his sister- Ms Wynonia Lawman* who indicate  understanding and agree with the plan   Code Status - Full Code  Likely DC to  --- to be determined patient currently intubated and sedated  Condition   --- critical  Roxan Hockey M.D on 05/25/2020 at 12:52 PM Go to [email or web address] -  for contact info  [facility] - Office  [phone]

## 2020-05-25 NOTE — ED Notes (Signed)
Date and time results received: 05/25/20 1042 (use smartphrase ".now" to insert current time)  Test: ABG Critical Value: PH 7.149; PCO2 103  Name of Provider Notified: Dr.Haviland  Orders Received? Or Actions Taken?:

## 2020-05-25 NOTE — Progress Notes (Signed)
Brief summary -69 year old smoker with OSA and noncompliance with CPAP admitted with acute hypoxic and hypercapnic respiratory failure, failed BiPAP trial, had persistent respiratory acidosis and required intubation and sedation  CRITICAL CARE Performed by: Roxan Hockey  Total critical care time: 76 minutes  Critical care time was exclusive of separately billable procedures and treating other patients.  Vent Settings:- PRVC/100%/5/24/620 -Continue IV propofol and IV fentanyl drip for sedation  Critical care was necessary to treat or prevent imminent or life-threatening deterioration.  Critical care was time spent personally by me on the following activities: development of treatment plan with patient and/or surrogate as well as nursing, discussions with consultants, evaluation of patient's response to treatment, examination of patient, obtaining history from patient or surrogate, ordering and performing treatments and interventions, ordering and review of laboratory studies, ordering and review of radiographic studies, pulse oximetry and re-evaluation of patient's condition.  --- Plan of care and advanced directives and CODE STATUS discussed with patient's next of kin Ms. Diamond Nickel, MD

## 2020-05-25 NOTE — ED Notes (Signed)
Color change noted. Intubation completed.

## 2020-05-25 NOTE — Progress Notes (Signed)
Transferred patient from ED to CT and then to ICU without complication.

## 2020-05-25 NOTE — ED Triage Notes (Signed)
Pt called out for dizziness. Pt fell this morning. Sister stated pt is noncompliant with meds. 02 was 70% on room air

## 2020-05-25 NOTE — ED Provider Notes (Signed)
[facility] EMERGENCY DEPARTMENT Provider Note   CSN: [id number] Arrival date & time: 05/25/20  0093     History Chief Complaint  Patient presents with  . Dizziness    Steven Howard is a 69 y.o. male.  Pt presents to the ED today with fall and dizziness.  Pt has a several year hx of dizziness.  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  He was offered surgical treatment, but it involved a small chance of a stroke, so he declined.  He has problems with orthostatic hypotension and is off all diuretics for his CHF.  Pt lives alone and called his 2 sisters to come and help him up.  Pt was on the living room floor when the sisters arrived.  Sister called EMS because he'd fallen and she could not get him up.  EMS found him to have a RA O2 sat of 70%.  He has been vaccinated against Covid.    Pt is a poor historian.  He does not remember falling today.  He is falling asleep on exam.        Past Medical History:  Diagnosis Date  . Cancer [facility] 2011   Prostate  . COPD (chronic obstructive pulmonary disease) ([place])   . Glaucoma   . Sleep apnea     Patient Active Problem List   Diagnosis Date Noted  . Respiratory failure with hypoxia ([place]) 05/25/2020  . Nicotine abuse 05/25/2020  . COPD (chronic obstructive pulmonary disease)/Emphysema 05/25/2020  . Acute respiratory failure with hypoxia and hypercapnia ([place]) 05/25/2020  . Acute exacerbation of CHF (congestive heart failure) ([place]) 05/25/2020  . COPD with acute exacerbation ([place]) 05/25/2020  . Orthostatic hypotension 12/18/2018  . Hyponatremia 07/11/2018  . Sleeps in sitting position due to orthopnea 05/19/2018  . OSA and COPD overlap syndrome ([place]) 05/19/2018  . 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 Excessive daytime sleepiness 05/19/2018  . Other sleep apnea 04/20/2018  . Prostate cancer ([place]) 04/18/2018  . Frequent UTI 04/18/2018  . Pulmonary  emphysema ([place]) 04/18/2018  . Essential hypertension 04/18/2018  . Alcohol abuse 04/18/2018  . Mixed hyperlipidemia 04/18/2018  . Hematuria 04/18/2018  . Recurrent major depressive disorder, in partial remission ([place]) 04/18/2018  . Cigarette nicotine dependence without complication 81/82/9937  . PVD (peripheral vascular disease) ([place]) 04/18/2018    Past Surgical History:  Procedure Laterality Date  . 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 PROSTATE SURGERY  2011   seeds implanted/radiation       Family History  Problem Relation Age of Onset  . Stroke Mother   . Alcohol abuse Brother   . Heart disease Maternal Grandmother   . COPD Brother     Social History   Tobacco Use  . Smoking status: Current Every Day Smoker    Packs/day: 0.25    Years: 55.00    Pack years: 13.75    Types: Cigars, Cigarettes  . Smokeless tobacco: Never Used  Vaping Use  . Vaping Use: Never used  Substance Use Topics  . Alcohol use: Yes    Comment: occasional  . Drug use: Never    Home Medications Prior to Admission medications   Medication Sig Start Date End Date Taking? Authorizing Provider  BREO ELLIPTA 100-25  MCG/INH AEPB INHALE 1 PUFF INTO LUNGS DAILY Patient taking differently: Inhale 1 puff into the lungs daily.  02/28/20   Claretta Fraise, MD    Allergies    Patient has no known allergies.  Review of Systems   Review of Systems  Unable to perform ROS: Mental status change  Respiratory: Positive for shortness of breath.   Neurological: Positive for dizziness.    Physical Exam Updated Vital Signs BP 122/82   Pulse 79   Temp 98 F (36.7 C) (Oral)   Resp 19   Ht 6' (1.829 m)   Wt 105.7 kg   SpO2 97%   BMI 31.60 kg/m   Physical Exam Vitals and nursing note reviewed.  Constitutional:      General:  He is in acute distress.     Appearance: He is obese.  HENT:     Head: Normocephalic and atraumatic.     Right Ear: External ear normal.     Left Ear: External ear normal.     Nose: Nose normal.     Mouth/Throat:     Mouth: Mucous membranes are dry.  Eyes:     Extraocular Movements: Extraocular movements intact.     Conjunctiva/sclera: Conjunctivae normal.     Pupils: Pupils are equal, round, and reactive to light.  Cardiovascular:     Rate and Rhythm: Normal rate and regular rhythm.     Pulses: Normal pulses.     Heart sounds: Normal heart sounds.  Pulmonary:     Effort: Bradypnea present.     Breath sounds: Rhonchi present.  Abdominal:     General: Abdomen is flat. Bowel sounds are normal.     Palpations: Abdomen is soft.  Musculoskeletal:     Cervical back: Normal range of motion and neck supple.     Right lower leg: Edema present.     Left lower leg: Edema present.  Skin:    General: Skin is warm.     Capillary Refill: Capillary refill takes less than 2 seconds.  Neurological:     General: No focal deficit present.     Mental Status: He is lethargic.     Comments: Pt will awaken briefly to voice.  He will follow simple commands then go back to sleep.  Psychiatric:        Mood and Affect: Mood normal.        Behavior: Behavior normal.        Thought Content: Thought content normal.        Judgment: Judgment normal.     ED Results / Procedures / Treatments   Labs (all labs ordered are listed, but only abnormal results are displayed) Labs Reviewed  BRAIN NATRIURETIC PEPTIDE - Abnormal; Notable for the following components:      Result Value   B Natriuretic Peptide 455.0 (*)    All other components within normal limits  CBC WITH DIFFERENTIAL/PLATELET - Abnormal; Notable for the following components:   HCT 52.7 (*)    RDW 15.6 (*)    nRBC 0.3 (*)    Monocytes Absolute 1.3 (*)    All other components within normal limits  COMPREHENSIVE METABOLIC PANEL - Abnormal;  Notable for the following components:   Sodium 128 (*)    Chloride 89 (*)    Glucose, Bld 100 (*)    BUN 58 (*)    Creatinine, Ser 1.49 (*)    Calcium 8.5 (*)    Albumin 3.4 (*)    Total Bilirubin  1.3 (*)    GFR calc non Af Amer 47 (*)    GFR calc Af Amer 54 (*)    All other components within normal limits  BLOOD GAS, ARTERIAL - Abnormal; Notable for the following components:   pH, Arterial 7.149 (*)    pCO2 arterial 103 (*)    pO2, Arterial 140 (*)    Acid-Base Excess 5.9 (*)    All other components within normal limits  RESPIRATORY PANEL BY RT PCR (FLU A&B, COVID)  CULTURE, BLOOD (ROUTINE X 2)  CULTURE, BLOOD (ROUTINE X 2)  URINE CULTURE  LACTIC ACID, PLASMA  LACTIC ACID, PLASMA  TRIGLYCERIDES  URINALYSIS, ROUTINE W REFLEX MICROSCOPIC  BLOOD GAS, ARTERIAL  TROPONIN I (HIGH SENSITIVITY)  TROPONIN I (HIGH SENSITIVITY)    EKG EKG Interpretation  Date/Time:  Sunday May 25 2020 08:34:36 EDT Ventricular Rate:  95 PR Interval:    QRS Duration: 111 QT Interval:  372 QTC Calculation: 468 R Axis:   155 Text Interpretation: Sinus rhythm Left posterior fascicular block Low voltage with right axis deviation Abnormal R-wave progression, late transition No significant change since last tracing Confirmed by Isla Pence [phone]) on 05/25/2020 8:39:55 AM   Radiology DG Chest Portable 1 View  Result Date: 05/25/2020 CLINICAL DATA:  Shortness of breath.  Intubation. EXAM: PORTABLE CHEST 1 VIEW COMPARISON:  05/25/2020 chest radiograph FINDINGS: An endotracheal tube is identified with tip 4.5 cm above the carina. An NG tube is present entering the UPPER abdomen with tip off the field of view. Pulmonary vascular congestion and possible bilateral trace pleural effusions again noted. No other changes identified. IMPRESSION: 1. Endotracheal tube with tip 4.5 cm above the carina. NG tube placement. 2. Pulmonary vascular congestion and possible trace bilateral pleural effusions.  Electronically Signed   By: Margarette Canada M.D.   On: 05/25/2020 11:43   DG Chest Port 1 View  Result Date: 05/25/2020 CLINICAL DATA:  69 year old male with acute shortness of breath. EXAM: PORTABLE CHEST 1 VIEW COMPARISON:  None. FINDINGS: Cardiomegaly identified. Pulmonary vascular congestion is present with possible trace effusions. There is no evidence of pneumothorax, mass or consolidation. No acute bony abnormalities are present. IMPRESSION: Cardiomegaly with pulmonary vascular congestion and possible trace effusions. Electronically Signed   By: Margarette Canada M.D.   On: 05/25/2020 09:24    Procedures Procedure Name: Intubation Date/Time: 05/25/2020 11:21 AM Performed by: Isla Pence, MD Pre-anesthesia Checklist: Patient identified, Emergency Drugs available, Suction available, Timeout performed and Patient being monitored Preoxygenation: Pre-oxygenation with 100% oxygen Induction Type: Rapid sequence Laryngoscope Size: Glidescope and 3 Tube size: 7.5 mm Number of attempts: 1 Placement Confirmation: ETT inserted through vocal cords under direct vision,  Positive ETCO2 and Breath sounds checked- equal and bilateral Secured at: 24 cm Tube secured with: ETT holder Dental Injury: Teeth and Oropharynx as per pre-operative assessment  Difficulty Due To: Difficulty was anticipated, Difficult Airway- due to reduced neck mobility, Difficult Airway- due to limited oral opening and Difficult Airway- due to dentition Future Recommendations: Recommend- induction with short-acting agent, and alternative techniques readily available      (including critical care time)  Medications Ordered in ED Medications  propofol (DIPRIVAN) 1000 MG/100ML infusion (45 mcg/kg/min  105.7 kg Intravenous Rate/Dose Verify 05/25/20 1300)  sodium chloride flush (NS) 0.9 % injection 3 mL (3 mLs Intravenous Not Given 05/25/20 1254)  sodium chloride flush (NS) 0.9 % injection 3 mL (has no administration in time range)    0.9 %  sodium chloride infusion (  has no administration in time range)  ondansetron (ZOFRAN) tablet 4 mg (has no administration in time range)    Or  ondansetron (ZOFRAN) injection 4 mg (has no administration in time range)  heparin injection 5,000 Units (5,000 Units Subcutaneous Given 05/25/20 1308)  sodium chloride flush (NS) 0.9 % injection 3 mL (3 mLs Intravenous Not Given 05/25/20 1254)  famotidine (PEPCID) IVPB 20 mg premix (20 mg Intravenous New Bag/Given 05/25/20 1306)  furosemide (LASIX) injection 40 mg (has no administration in time range)  docusate (COLACE) 50 MG/5ML liquid 100 mg (100 mg Oral Not Given 05/25/20 1254)  polyethylene glycol (MIRALAX / GLYCOLAX) packet 17 g (17 g Oral Not Given 05/25/20 1253)  fentaNYL (SUBLIMAZE) injection 25 mcg (25 mcg Intravenous Given 05/25/20 1245)  fentaNYL (SUBLIMAZE) injection 25-100 mcg (100 mcg Intravenous Given 05/25/20 1229)  propofol (DIPRIVAN) 1000 MG/100ML infusion (50 mcg/kg/min  105.7 kg Intravenous Rate/Dose Verify 05/25/20 1253)  acetaminophen (TYLENOL) 160 MG/5ML solution 325 mg (has no administration in time range)  albuterol (VENTOLIN HFA) 108 (90 Base) MCG/ACT inhaler 4 puff (has no administration in time range)  fentaNYL 2549mcg in NS 211mL (19mcg/ml) infusion-PREMIX (has no administration in time range)  etomidate (AMIDATE) injection 20 mg (20 mg Intravenous Given 05/25/20 1100)  succinylcholine (ANECTINE) injection 100 mg (100 mg Intravenous Given 05/25/20 1101)    ED Course  I have reviewed the triage vital signs and the nursing notes.  Pertinent labs & imaging results that were available during my care of the patient were reviewed by me and considered in my medical decision making (see chart for details).    MDM Rules/Calculators/A&P                          I suspected hypercarbia, so I put pt on bipap.  Unfortunately, pt's hypercarbia did not resolve on bipap.  Ph 7.149 and pCO2 103.  Pt was then intubated.    I think he  is a combination of COPD and CHF.  His bp has been low, so he was not given diuretics.  I did not give him fluids because he has some CHF on CXR.  Hyponatremia likely from fluid overload.  Pt's sister updated.    Pt d/w Dr. Denton Brick (triad) for admission.  CRITICAL CARE Performed by: Isla Pence   Total critical care time: 60 minutes  Critical care time was exclusive of separately billable procedures and treating other patients.  Critical care was necessary to treat or prevent imminent or life-threatening deterioration.  Critical care was time spent personally by me on the following activities: development of treatment plan with patient and/or surrogate as well as nursing, discussions with consultants, evaluation of patient's response to treatment, examination of patient, obtaining history from patient or surrogate, ordering and performing treatments and interventions, ordering and review of laboratory studies, ordering and review of radiographic studies, pulse oximetry and re-evaluation of patient's condition.  TAMMY ERICSSON was evaluated in Emergency Department on 05/25/2020 for the symptoms described in the history of present illness. He was evaluated in the context of the global COVID-19 pandemic, which necessitated consideration that the patient might be at risk for infection with the SARS-CoV-2 virus that causes COVID-19. Institutional protocols and algorithms that pertain to the evaluation of patients at risk for COVID-19 are in a state of rapid change based on information released by regulatory bodies including the CDC and federal and state organizations. These policies and algorithms were followed during the  patient's care in the ED. Final Clinical Impression(s) / ED Diagnoses Final diagnoses:  Acute respiratory failure with hypoxia and hypercapnia (HCC)  Acute on chronic congestive heart failure, unspecified heart failure type ([place])  COPD exacerbation (HCC)  Hyponatremia     Rx / DC Orders ED Discharge Orders    None       Isla Pence, MD 05/25/20 1320

## 2020-05-26 ENCOUNTER — Inpatient Hospital Stay (HOSPITAL_COMMUNITY): Payer: Medicare HMO

## 2020-05-26 ENCOUNTER — Other Ambulatory Visit (HOSPITAL_COMMUNITY): Payer: Self-pay | Admitting: *Deleted

## 2020-05-26 DIAGNOSIS — J9601 Acute respiratory failure with hypoxia: Secondary | ICD-10-CM

## 2020-05-26 DIAGNOSIS — J9602 Acute respiratory failure with hypercapnia: Secondary | ICD-10-CM

## 2020-05-26 DIAGNOSIS — J441 Chronic obstructive pulmonary disease with (acute) exacerbation: Secondary | ICD-10-CM

## 2020-05-26 LAB — BLOOD GAS, ARTERIAL
Acid-Base Excess: 9.7 mmol/L — ABNORMAL HIGH (ref 0.0–2.0)
Acid-Base Excess: 11.4 mmol/L — ABNORMAL HIGH (ref 0.0–2.0)
Bicarbonate: 34.4 mmol/L — ABNORMAL HIGH (ref 20.0–28.0)
Bicarbonate: 34.4 mmol/L — ABNORMAL HIGH (ref 20.0–28.0)
FIO2: 80
FIO2: 80
O2 Saturation: 96.4 %
O2 Saturation: 92.7 %
Patient temperature: 37
Patient temperature: 37
pCO2 arterial: 31.1 mmHg — ABNORMAL LOW (ref 32.0–48.0)
pCO2 arterial: 43.8 mmHg (ref 32.0–48.0)
pH, Arterial: 7.618 (ref 7.350–7.450)
pH, Arterial: 7.516 — ABNORMAL HIGH (ref 7.350–7.450)
pO2, Arterial: 76.1 mmHg — ABNORMAL LOW (ref 83.0–108.0)
pO2, Arterial: 67.7 mmHg — ABNORMAL LOW (ref 83.0–108.0)

## 2020-05-26 LAB — BASIC METABOLIC PANEL
Anion gap: 12 (ref 5–15)
BUN: 43 mg/dL — ABNORMAL HIGH (ref 8–23)
CO2: 31 mmol/L (ref 22–32)
Calcium: 8.1 mg/dL — ABNORMAL LOW (ref 8.9–10.3)
Chloride: 93 mmol/L — ABNORMAL LOW (ref 98–111)
Creatinine, Ser: 1.06 mg/dL (ref 0.61–1.24)
GFR calc Af Amer: >60 mL/min (ref >60)
GFR calc non Af Amer: >60 mL/min (ref >60)
Glucose, Bld: 129 mg/dL — ABNORMAL HIGH (ref 70–99)
Potassium: 3.3 mmol/L — ABNORMAL LOW (ref 3.5–5.1)
Sodium: 136 mmol/L (ref 135–145)

## 2020-05-26 LAB — CBC
HCT: 48.8 % (ref 39.0–52.0)
Hemoglobin: 16.6 g/dL (ref 13.0–17.0)
MCH: 31.5 pg (ref 26.0–34.0)
MCHC: 34 g/dL (ref 30.0–36.0)
MCV: 92.6 fL (ref 80.0–100.0)
Platelets: 244 10*3/uL (ref 150–400)
RBC: 5.27 MIL/uL (ref 4.22–5.81)
RDW: 15 % (ref 11.5–15.5)
WBC: 3.1 10*3/uL — ABNORMAL LOW (ref 4.0–10.5)
nRBC: 0 % (ref 0.0–0.2)

## 2020-05-26 LAB — TRIGLYCERIDES: Triglycerides: 66 mg/dL (ref <150)

## 2020-05-26 LAB — MAGNESIUM: Magnesium: 1.9 mg/dL (ref 1.7–2.4)

## 2020-05-26 LAB — ECHOCARDIOGRAM COMPLETE
Area-P 1/2: 2.78 cm2
Calc EF: 21.7 %
Height: 72 in
S' Lateral: 3.73 cm
Single Plane A2C EF: 31.8 %
Single Plane A4C EF: 18 %
Weight: 3992.97 oz

## 2020-05-26 LAB — GLUCOSE, CAPILLARY
Glucose-Capillary: 131 mg/dL — ABNORMAL HIGH (ref 70–99)
Glucose-Capillary: 133 mg/dL — ABNORMAL HIGH (ref 70–99)
Glucose-Capillary: 136 mg/dL — ABNORMAL HIGH (ref 70–99)
Glucose-Capillary: 138 mg/dL — ABNORMAL HIGH (ref 70–99)
Glucose-Capillary: 142 mg/dL — ABNORMAL HIGH (ref 70–99)

## 2020-05-26 LAB — URINE CULTURE

## 2020-05-26 LAB — CORTISOL-AM, BLOOD: Cortisol - AM: 4.4 ug/dL — ABNORMAL LOW (ref 6.7–22.6)

## 2020-05-26 LAB — PROCALCITONIN: Procalcitonin: <0.1 ng/mL

## 2020-05-26 MED ORDER — PERFLUTREN LIPID MICROSPHERE
1.0000 mL | INTRAVENOUS | Status: AC | PRN
  Administered 2020-05-26: 2 mL via INTRAVENOUS

## 2020-05-26 MED ORDER — POTASSIUM CHLORIDE 10 MEQ/100ML IV SOLN
10.0000 meq | INTRAVENOUS | Status: AC
  Administered 2020-05-26 (×4): 10 meq via INTRAVENOUS
  Filled 2020-05-26: qty 100

## 2020-05-26 MED ORDER — METHYLPREDNISOLONE SODIUM SUCC 40 MG IJ SOLR
40.0000 mg | Freq: Two times a day (BID) | INTRAMUSCULAR | Status: DC
  Administered 2020-05-26 – 2020-05-27 (×2): 40 mg via INTRAVENOUS
  Filled 2020-05-26 (×2): qty 1

## 2020-05-26 NOTE — Consult Note (Signed)
NAME:  Steven Howard, MRN:  [id number], DOB:  08/12/1951, LOS: 1 ADMISSION DATE:  05/25/2020, CONSULTATION DATE:  05/26/2020  REFERRING MD:  Manuella Ghazi, triad MD CHIEF COMPLAINT:  Vent management   Brief History   69 year old man with COPD, OSA admitted with acute on chronic hypercarbic respiratory failure due to CHF/COPD exacerbation  History of present illness   69 year old smoker with known COPD, BIBEMS for altered mental status and after a fall at home, found to have ABG 7.15/103/140 on 100% FiO2 after 2 hours of BiPAP and required mechanical ventilation.  BNP was 455, creatinine 1.5, sodium 128, chest x-ray showed bilateral interstitial prominence consistent with CHF and trace bilateral effusions. He was admitted to ICU, sedated, diuresed.  He was also treated with IV steroids bronchodilators and doxycycline for an acute COPD exacerbation.  PCCM consulted to assist with ICU management Patient is currently unresponsive and history is obtained after review of chart  Past Medical History  COPD/emphysema OSA, noncompliant with CPAP AV malformation of right temporal lobe , declined neuro IR intervention Orthostatic hypotension  Significant Hospital Events   9/26 admitted, intubated  Consults:    Procedures:  ETT 9/26 >>  Significant Diagnostic Tests:  Head CT 9/26 >> no change in dural AVM  Micro Data:  resp 9/27 >> [facility] 9/26 >>  Antimicrobials:     Interim history/subjective:    Objective   Blood pressure (!) 97/49, pulse 66, temperature (!) 97.4 F (36.3 C), temperature source Axillary, resp. rate 18, height 6' (1.829 m), weight 113.2 kg, SpO2 92 %.    Vent Mode: PRVC FiO2 (%):  [60 %-100 %] 60 % Set Rate:  [18 bmp-24 bmp] 18 bmp Vt Set:  [620 mL] 620 mL PEEP:  [5 cmH20-55 cmH20] 8 cmH20 Plateau Pressure:  [15 cmH20-20 cmH20] 19 cmH20   Intake/Output Summary (Last 24 hours) at 05/26/2020 1315 Last data filed at 05/26/2020 8563 Gross per 24 hour  Intake 145.82 ml    Output 7300 ml  Net -7154.18 ml   Filed Weights   05/25/20 0828 05/25/20 1421 05/26/20 0530  Weight: 105.7 kg 118.1 kg 113.2 kg    Examination: Gen:      elderly man, no distress , intubated, sedated on propofol and fentanyl HEENT:  EOMI, sclera anicteric, mild pallor Neck:     No JVD; no thyromegaly Lungs:    Decreased breath sounds bilateral CV:         Regular rate and rhythm; no murmurs Abd:      + bowel sounds; soft, non-tender; no palpable masses, no distension Ext:    1+ edema; adequate peripheral perfusion Skin:      Warm and dry; no rash Neuro: RASS -3, pupils 2 mm, arterial   Chest x-ray 9/27 independently reviewed which shows ET tube in position, no effusions, some improved interstitial prominence  Resolved Hospital Problem list     Assessment & Plan:  Acute hypercarbic respiratory failure likely combination of heart failure and COPD exacerbation, no evidence of pneumonia or acute coronary ischemia -He appears to be over ventilated with pH of 7.62 at RR 24  -Vent settings reviewed and adjusted -On dropping RR to 18, pH still remains at 7.52, will drop RR further to 14 , aim for near normal pH, PCO2 can rise. -Add PEEP of 8 to enable further drop in FiO2 -Peak pressure  24 and there is no evidence of bronchospasm, can drop Solu-Medrol to 40 every 12 and plan to taper further -He  has diuresed about 7 L, would continue Lasix, replete hypokalemia and hypomagnesemia. -No evidence of infection, does not need antibiotics  -Can decrease sedation for goal RASS 0 to -1, plan to start weaning soon -Obtain echocardiogram   Best practice:  Diet: NPO Pain/Anxiety/Delirium protocol (if indicated): Propofol/fentanyl VAP protocol (if indicated): Y DVT prophylaxis: Subcu Lovenox GI prophylaxis: Protonix Glucose control: SSI Mobility: Bedrest Code Status: Full Family Communication: Per primary Disposition: ICU  Labs   CBC: Recent Labs  Lab 05/25/20 0830  05/26/20 0701  WBC 7.4 3.1*  NEUTROABS 4.8  --   HGB 16.7 16.6  HCT 52.7* 48.8  MCV 99.1 92.6  PLT 238 546    Basic Metabolic Panel: Recent Labs  Lab 05/25/20 0830 05/26/20 0701  NA 128* 136  K 4.7 3.3*  CL 89* 93*  CO2 29 31  GLUCOSE 100* 129*  BUN 58* 43*  CREATININE 1.49* 1.06  CALCIUM 8.5* 8.1*  MG  --  1.9   GFR: Estimated Creatinine Clearance: 83 mL/min (by C-G formula based on SCr of 1.06 mg/dL). Recent Labs  Lab 05/25/20 0830 05/25/20 0926 05/25/20 1129 05/26/20 0701  PROCALCITON  --   --   --  <0.10  WBC 7.4  --   --  3.1*  LATICACIDVEN  --  1.0 0.9  --     Liver Function Tests: Recent Labs  Lab 05/25/20 0830  AST 22  ALT 15  ALKPHOS 62  BILITOT 1.3*  PROT 6.5  ALBUMIN 3.4*   No results for input(s): LIPASE, AMYLASE in the last 168 hours. No results for input(s): AMMONIA in the last 168 hours.  ABG    Component Value Date/Time   PHART 7.516 (H) 05/26/2020 0625   PCO2ART 43.8 05/26/2020 0625   PO2ART 67.7 (L) 05/26/2020 0625   HCO3 34.4 (H) 05/26/2020 0625   O2SAT 92.7 05/26/2020 0625     Coagulation Profile: No results for input(s): INR, PROTIME in the last 168 hours.  Cardiac Enzymes: No results for input(s): CKTOTAL, CKMB, CKMBINDEX, TROPONINI in the last 168 hours.  HbA1C: No results found for: HGBA1C  CBG: Recent Labs  Lab 05/25/20 1613 05/25/20 1954 05/26/20 0212 05/26/20 0751 05/26/20 1130  GLUCAP 102* 99 131* 133* 136*    Review of Systems:   Unable to obtain since intubated and sedated  Past Medical History  He,  has a past medical history of Cancer ([place]) (2011), COPD (chronic obstructive pulmonary disease) ([place]), Glaucoma, and Sleep apnea.   Surgical History    Past Surgical History:  Procedure Laterality Date  . CYSTOSCOPY WITH URETHRAL DILATATION N/A 06/27/2018   Procedure: CYSTOSCOPY WITH BALLOON DILATATION OF URETHRAL STRICTURE;  Surgeon: Franchot Gallo, MD;  Location: [facility];  Service: Urology;   Laterality: N/A;  . PROSTATE SURGERY  2011   seeds implanted/radiation     Social History   reports that he has been smoking cigars and cigarettes. He has a 13.75 pack-year smoking history. He has never used smokeless tobacco. He reports current alcohol use. He reports that he does not use drugs.   Family History   His family history includes Alcohol abuse in his brother; COPD in his brother; Heart disease in his maternal grandmother; Stroke in his mother.   Allergies No Known Allergies   Home Medications  Prior to Admission medications   Medication Sig Start Date End Date Taking? Authorizing Provider  BREO ELLIPTA 100-25 MCG/INH AEPB INHALE 1 PUFF INTO LUNGS DAILY Patient taking differently: Inhale 1 puff  into the lungs daily.  02/28/20   Claretta Fraise, MD     Critical care time: NA     Kara Mead MD. Shade Flood. [facility] See Amion for pager  If no response to pager , please call [phone]  After 7:00 pm call Elink  [phone]   05/26/2020

## 2020-05-26 NOTE — Progress Notes (Signed)
**Note De-identified Chantel Teti Obfuscation** Sputum collected and sent to lab 

## 2020-05-26 NOTE — Progress Notes (Signed)
Dr. Josephine Cables notified Critical lab ABG, pH 7.618.

## 2020-05-26 NOTE — Progress Notes (Signed)
PROGRESS NOTE    Steven Howard  YTK:[id number] DOB: 09/24/1950 DOA: 05/25/2020 PCP: Claretta Fraise, MD   Brief Narrative:  Per HPI: Steven Howard  is a 69 y.o. 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 Pt. Relates that he was told that it could be surgically deflated, which patient declined due to concerns about some risk of stroke associated with this procedure--- presents today to the ED after falling at home and found to have altered mentation with persistent hypercapnia despite BiPAP use in the ED  9/27: Patient was admitted with combined hypoxemic and hypercapnic respiratory failure secondary to combined CHF and COPD exacerbation for which he is required intubation.  He remains on IV Lasix for diuresis as well as breathing treatments and Solu-Medrol.  PCCM following and weaning ventilator as tolerated.  Assessment & Plan:   Principal Problem:   Acute respiratory failure with hypoxia and hypercapnia (HCC) Active Problems:   Essential hypertension   Alcohol abuse   Sleeps in sitting position due to orthopnea   OSA and COPD overlap syndrome (HCC)   Hyponatremia   Orthostatic hypotension   Nicotine abuse   COPD (chronic obstructive pulmonary disease)/Emphysema   Acute exacerbation of CHF (congestive heart failure) (HCC)   COPD with acute exacerbation (HCC)   Acute hypoxemic and hypercapnic respiratory failure-multifactorial -Related to COPD and CHF exacerbation requiring intubation -Appreciate PCCM assistance with weaning of ventilator  Acute CHF exacerbation -Systolic versus diastolic  with 2D echocardiogram pending -Continue IV Lasix with 7 L diuresis noted overnight -Renal function stable, monitor in a.m.  Acute COPD exacerbation with OSA overlap -Continue IV steroids and bronchodilators -Appreciate PCCM assistance while on ventilator -Calcitonin less than 0.10  Acute hypercapnic encephalopathy -CT head with no acute findings -PCO2 noted to be 103 on admission despite BiPAP use  AKI-improving -Baseline creatinine 0.7 and currently creatinine 1 -Monitor while on diuresis  Hyponatremia/hypochloremia -Much improved with diuresis -Could be related to beer Poto mania  History of alcohol and tobacco abuse -Patient will need nicotine patch after extubation and further counseling  History of orthostatic hypotension with recurrent falls -Cortisol ordered and pending  History of dural arteriovenous malformation of brain -Patient was recommended image guided diagnostic cerebral arteriogram with intervention by NIR, but refused  DVT prophylaxis:Heparin Code Status: Full code Family Communication: Discussed with sister on phone 9/27 Disposition Plan:  Status is: Inpatient  Remains inpatient appropriate because:Altered mental status, IV treatments appropriate due to intensity of illness or inability to take PO and Inpatient level of care appropriate due to severity of illness   Dispo: The patient is from: Home              Anticipated d/c is to: Home              Anticipated d/c date is: 3 days              Patient currently is not medically stable to d/c.   Consultants:   PCCM  Procedures:   See below s/p intubation 9/26  Antimicrobials:  Anti-infectives (From admission, onward)   Start     Dose/Rate Route Frequency Ordered Stop   05/25/20 1445  doxycycline (VIBRAMYCIN) 100 mg in sodium chloride 0.9 % 250 mL IVPB        100 mg 125 mL/hr over 120 Minutes Intravenous Every 12 hours 05/25/20 1437     05/25/20 1400  doxycycline (VIBRA-TABS) tablet 100 mg   Status:  Discontinued        100 mg Oral Every 12 hours 05/25/20 1345 05/25/20 1437      Subjective: Patient seen and evaluated today and remains sedated on ventilator.  He appears to be diuresing well.  Objective: Vitals:   05/26/20 0700 05/26/20 0800 05/26/20 0900 05/26/20 1000  BP: (!) 94/49 (!) 113/58 (!) 108/53 (!) 110/55  Pulse: 69 70 68 68  Resp: _0 Temp:      TempSrc:      SpO2: 94% 94% 91% 92%  Weight:      Height:        Intake/Output Summary (Last 24 hours) at 05/26/2020 1114 Last data filed at 05/26/2020 9381 Gross per 24 hour  Intake 199.4 ml  Output 7300 ml  Net -7100.6 ml   Filed Weights   05/25/20 0828 05/25/20 1421 05/26/20 0530  Weight: 105.7 kg 118.1 kg 113.2 kg    Examination:  General exam: Appears sedated on ventilator Respiratory system: Clear to auscultation. Respiratory effort normal.  Intubated on FiO2 80%. Cardiovascular system: S1 & S2 heard, RRR.  Gastrointestinal system: Abdomen is nondistended, soft and nontender.  Central nervous system: Sedated Extremities: No edema Skin: No rashes, lesions or ulcers Psychiatry: Cannot be assessed    Data Reviewed: I have personally reviewed following labs and imaging studies  CBC: Recent Labs  Lab 05/25/20 0830 05/26/20 0701  WBC 7.4 3.1*  NEUTROABS 4.8  --   HGB 16.7 16.6  HCT 52.7* 48.8  MCV 99.1 92.6  PLT 238 017   Basic Metabolic Panel: Recent Labs  Lab 05/25/20 0830 05/26/20 0701  NA 128* 136  K 4.7 3.3*  CL 89* 93*  CO2 29 31  GLUCOSE 100* 129*  BUN 58* 43*  CREATININE 1.49* 1.06  CALCIUM 8.5* 8.1*  MG  --  1.9   GFR: Estimated Creatinine Clearance: 83 mL/min (by C-G formula based on SCr of 1.06 mg/dL). Liver Function Tests: Recent Labs  Lab 05/25/20 0830  AST 22  ALT 15  ALKPHOS 62  BILITOT 1.3*  PROT 6.5  ALBUMIN 3.4*   No results for input(s): LIPASE, AMYLASE in the last 168 hours. No results for input(s): AMMONIA in the last 168  hours. Coagulation Profile: No results for input(s): INR, PROTIME in the last 168 hours. Cardiac Enzymes: No results for input(s): CKTOTAL, CKMB, CKMBINDEX, TROPONINI in the last 168 hours. BNP (last 3 results) No results for input(s): PROBNP in the last 8760 hours. HbA1C: No results for input(s): HGBA1C in the last 72 hours. CBG: Recent Labs  Lab 05/25/20 1613 05/25/20 1954 05/26/20 0212 05/26/20 0751  GLUCAP 102* 99 131* 133*   Lipid Profile: Recent Labs    05/25/20 1129 05/26/20 0701  TRIG 81 66   Thyroid Function Tests: No results for input(s): TSH, T4TOTAL, FREET4, T3FREE, THYROIDAB in the last 72 hours. Anemia Panel: No results for input(s): VITAMINB12, FOLATE, FERRITIN, TIBC, IRON, RETICCTPCT in the last 72 hours. Sepsis Labs: Recent Labs  Lab 05/25/20 0926 05/25/20 1129 05/26/20 0701  PROCALCITON  --   --  <0.10  LATICACIDVEN 1.0 0.9  --     Recent Results (from the past 240 hour(s))  Respiratory Panel  by RT PCR (Flu A&B, Covid) - Nasopharyngeal Swab     Status: None   Collection Time: 05/25/20  8:33 AM   Specimen: Nasopharyngeal Swab  Result Value Ref Range Status   SARS Coronavirus 2 by RT PCR NEGATIVE NEGATIVE Final    Comment: (NOTE) SARS-CoV-2 target nucleic acids are NOT DETECTED.  The SARS-CoV-2 RNA is generally detectable in upper respiratoy specimens during the acute phase of infection. The lowest concentration of SARS-CoV-2 viral copies this assay can detect is 131 copies/mL. A negative result does not preclude SARS-Cov-2 infection and should not be used as the sole basis for treatment or other patient management decisions. A negative result may occur with  improper specimen collection/handling, submission of specimen other than nasopharyngeal swab, presence of viral mutation(s) within the areas targeted by this assay, and inadequate number of viral copies (<131 copies/mL). A negative result must be combined with clinical observations,  patient history, and epidemiological information. The expected result is Negative.  Fact Sheet for Patients:  PinkCheek.be  Fact Sheet for Healthcare Providers:  GravelBags.it  This test is no t yet approved or cleared by the [place] FDA and  has been authorized for detection and/or diagnosis of SARS-CoV-2 by FDA under an Emergency Use Authorization (EUA). This EUA will remain  in effect (meaning this test can be used) for the duration of the COVID-19 declaration under Section 564(b)(1) of the Act, 21 U.S.C. section 360bbb-3(b)(1), unless the authorization is terminated or revoked sooner.     Influenza A by PCR NEGATIVE NEGATIVE Final   Influenza B by PCR NEGATIVE NEGATIVE Final    Comment: (NOTE) The Xpert Xpress SARS-CoV-2/FLU/RSV assay is intended as an aid in  the diagnosis of influenza from Nasopharyngeal swab specimens and  should not be used as a sole basis for treatment. Nasal washings and  aspirates are unacceptable for Xpert Xpress SARS-CoV-2/FLU/RSV  testing.  Fact Sheet for Patients: PinkCheek.be  Fact Sheet for Healthcare Providers: GravelBags.it  This test is not yet approved or cleared by the [place] FDA and  has been authorized for detection and/or diagnosis of SARS-CoV-2 by  FDA under an Emergency Use Authorization (EUA). This EUA will remain  in effect (meaning this test can be used) for the duration of the  Covid-19 declaration under Section 564(b)(1) of the Act, 21  U.S.C. section 360bbb-3(b)(1), unless the authorization is  terminated or revoked. Performed at [facility], [address], [place], [place] [address]   Culture, blood (routine x 2)     Status: None (Preliminary result)   Collection Time: 05/25/20  9:26 AM   Specimen: Right Antecubital; Blood  Result Value Ref Range Status   Specimen Description   Final    RIGHT  ANTECUBITAL BOTTLES DRAWN AEROBIC AND ANAEROBIC   Special Requests Blood Culture adequate volume  Final   Culture   Final    NO GROWTH 1 DAY Performed at [facility], [address] [place], [place] [address]    Report Status PENDING  Incomplete  Culture, blood (routine x 2)     Status: None (Preliminary result)   Collection Time: 05/25/20  9:26 AM   Specimen: BLOOD RIGHT HAND  Result Value Ref Range Status   Specimen Description BLOOD RIGHT HAND BOTTLES DRAWN AEROBIC ONLY  Final   Special Requests   Final    Blood Culture results may not be optimal due to an inadequate volume of blood received in culture bottles   Culture   Final  NO GROWTH 1 DAY Performed at [facility], [address] [place], [place] [address]    Report Status PENDING  Incomplete  MRSA PCR Screening     Status: None   Collection Time: 05/25/20  2:11 PM   Specimen: Nasal Mucosa; Nasopharyngeal  Result Value Ref Range Status   MRSA by PCR NEGATIVE NEGATIVE Final    Comment:        The GeneXpert MRSA Assay (FDA approved for NASAL specimens only), is one component of a comprehensive MRSA colonization surveillance program. It is not intended to diagnose MRSA infection nor to guide or monitor treatment for MRSA infections. Performed at [facility], [address] [place], [place] [address]          Radiology Studies: CT HEAD WO CONTRAST  Result Date: 05/25/2020 CLINICAL DATA:  Mental status change EXAM: CT HEAD WITHOUT CONTRAST TECHNIQUE: Contiguous axial images were obtained from the base of the skull through the vertex without intravenous contrast. COMPARISON:  05/25/2019 MRI head. FINDINGS: Brain: No acute infarct or intracranial hemorrhage. No mass lesion. No midline shift, ventriculomegaly or extra-axial fluid collection. Vascular: Right temporal calcifications likely correspond to known dural arteriovenous malformation, better demonstrated on prior MRI. Bilateral carotid siphon atherosclerotic  calcifications. Skull: Negative for fracture or focal lesion. Sinuses/Orbits: Normal orbits. Mild pansinus mucosal thickening. No mastoid effusion. Other: None. IMPRESSION: No acute infarct or intracranial hemorrhage. Sequela of right temporal dural AVM. Electronically Signed   By: Primitivo Gauze M.D.   On: 05/25/2020 14:17   DG CHEST PORT 1 VIEW  Result Date: 05/26/2020 CLINICAL DATA:  Dizziness after a fall yesterday. History of prostate cancer and COPD. Smoker. EXAM: PORTABLE CHEST 1 VIEW COMPARISON:  05/25/2020 FINDINGS: Enteric and endotracheal tubes are unchanged in position since prior study. Shallow inspiration. Diffuse cardiac enlargement. Bilateral interstitial pattern to the lungs consistent with edema. Bilateral pleural effusions. Calcification of the aorta. IMPRESSION: Cardiac enlargement with bilateral interstitial edema and bilateral pleural effusions. Appliances are unchanged in position. Electronically Signed   By: Lucienne Capers M.D.   On: 05/26/2020 06:24   DG Chest Portable 1 View  Result Date: 05/25/2020 CLINICAL DATA:  Shortness of breath.  Intubation. EXAM: PORTABLE CHEST 1 VIEW COMPARISON:  05/25/2020 chest radiograph FINDINGS: An endotracheal tube is identified with tip 4.5 cm above the carina. An NG tube is present entering the UPPER abdomen with tip off the field of view. Pulmonary vascular congestion and possible bilateral trace pleural effusions again noted. No other changes identified. IMPRESSION: 1. Endotracheal tube with tip 4.5 cm above the carina. NG tube placement. 2. Pulmonary vascular congestion and possible trace bilateral pleural effusions. Electronically Signed   By: Margarette Canada M.D.   On: 05/25/2020 11:43   DG Chest Port 1 View  Result Date: 05/25/2020 CLINICAL DATA:  69 year old male with acute shortness of breath. EXAM: PORTABLE CHEST 1 VIEW COMPARISON:  None. FINDINGS: Cardiomegaly identified. Pulmonary vascular congestion is present with possible trace  effusions. There is no evidence of pneumothorax, mass or consolidation. No acute bony abnormalities are present. IMPRESSION: Cardiomegaly with pulmonary vascular congestion and possible trace effusions. Electronically Signed   By: Margarette Canada M.D.   On: 05/25/2020 09:24        Scheduled Meds:  acetaminophen (TYLENOL) oral liquid 160 mg/5 mL  325 mg Per Tube Q6H   chlorhexidine gluconate (MEDLINE KIT)  15 mL Mouth Rinse BID   Chlorhexidine Gluconate Cloth  6 each Topical Daily   docusate  100 mg  Per Tube BID   furosemide  40 mg Intravenous Q12H   heparin  5,000 Units Subcutaneous Q8H   mouth rinse  15 mL Mouth Rinse 10 times per day   methylPREDNISolone (SOLU-MEDROL) injection  40 mg Intravenous Q8H   polyethylene glycol  17 g Oral Daily   sodium chloride flush  3 mL Intravenous Q12H   sodium chloride flush  3 mL Intravenous Q12H   Continuous Infusions:  sodium chloride     doxycycline (VIBRAMYCIN) IV 100 mg (05/26/20 0240)   famotidine (PEPCID) IV 20 mg (05/25/20 1306)   fentaNYL infusion INTRAVENOUS 50 mcg/hr (05/25/20 2324)   potassium chloride     propofol (DIPRIVAN) infusion Stopped (05/25/20 1726)   propofol (DIPRIVAN) infusion 40 mcg/kg/min (05/26/20 1112)     LOS: 1 day    Time spent: 35 minutes    Steven Howard Darleen Crocker, DO [facility]  If 7PM-7AM, please contact night-coverage [email or web address] 05/26/2020, 11:14 AM

## 2020-05-26 NOTE — Progress Notes (Signed)
Echocardiogram 2D Echocardiogram with deifnity has been performed.  Steven Howard M 05/26/2020, 2:58 PM

## 2020-05-27 ENCOUNTER — Inpatient Hospital Stay (HOSPITAL_COMMUNITY): Payer: Medicare HMO

## 2020-05-27 DIAGNOSIS — I5033 Acute on chronic diastolic (congestive) heart failure: Secondary | ICD-10-CM

## 2020-05-27 LAB — GLUCOSE, CAPILLARY
Glucose-Capillary: 111 mg/dL — ABNORMAL HIGH (ref 70–99)
Glucose-Capillary: 113 mg/dL — ABNORMAL HIGH (ref 70–99)
Glucose-Capillary: 118 mg/dL — ABNORMAL HIGH (ref 70–99)
Glucose-Capillary: 126 mg/dL — ABNORMAL HIGH (ref 70–99)
Glucose-Capillary: 130 mg/dL — ABNORMAL HIGH (ref 70–99)
Glucose-Capillary: 138 mg/dL — ABNORMAL HIGH (ref 70–99)

## 2020-05-27 LAB — BLOOD GAS, ARTERIAL
Acid-Base Excess: 9.7 mmol/L — ABNORMAL HIGH (ref 0.0–2.0)
Bicarbonate: 32.8 mmol/L — ABNORMAL HIGH (ref 20.0–28.0)
Drawn by: 22223
FIO2: 75
MECHVT: 620 mL
O2 Saturation: 92.8 %
Patient temperature: 37
RATE: 18 resp/min
pCO2 arterial: 43.6 mmHg (ref 32.0–48.0)
pH, Arterial: 7.498 — ABNORMAL HIGH (ref 7.350–7.450)
pO2, Arterial: 69.8 mmHg — ABNORMAL LOW (ref 83.0–108.0)

## 2020-05-27 LAB — BASIC METABOLIC PANEL
Anion gap: 11 (ref 5–15)
BUN: 45 mg/dL — ABNORMAL HIGH (ref 8–23)
CO2: 31 mmol/L (ref 22–32)
Calcium: 8.2 mg/dL — ABNORMAL LOW (ref 8.9–10.3)
Chloride: 94 mmol/L — ABNORMAL LOW (ref 98–111)
Creatinine, Ser: 1.21 mg/dL (ref 0.61–1.24)
GFR calc Af Amer: >60 mL/min (ref >60)
GFR calc non Af Amer: 60 mL/min — ABNORMAL LOW (ref >60)
Glucose, Bld: 131 mg/dL — ABNORMAL HIGH (ref 70–99)
Potassium: 3.9 mmol/L (ref 3.5–5.1)
Sodium: 136 mmol/L (ref 135–145)

## 2020-05-27 LAB — CBC
HCT: 50.3 % (ref 39.0–52.0)
Hemoglobin: 16.9 g/dL (ref 13.0–17.0)
MCH: 31.2 pg (ref 26.0–34.0)
MCHC: 33.6 g/dL (ref 30.0–36.0)
MCV: 93 fL (ref 80.0–100.0)
Platelets: 259 10*3/uL (ref 150–400)
RBC: 5.41 MIL/uL (ref 4.22–5.81)
RDW: 15.5 % (ref 11.5–15.5)
WBC: 8.2 10*3/uL (ref 4.0–10.5)
nRBC: 0 % (ref 0.0–0.2)

## 2020-05-27 LAB — TRIGLYCERIDES: Triglycerides: 61 mg/dL (ref <150)

## 2020-05-27 LAB — MAGNESIUM: Magnesium: 1.9 mg/dL (ref 1.7–2.4)

## 2020-05-27 MED ORDER — METHYLPREDNISOLONE SODIUM SUCC 40 MG IJ SOLR
40.0000 mg | INTRAMUSCULAR | Status: DC
  Administered 2020-05-28 – 2020-05-30 (×3): 40 mg via INTRAVENOUS
  Filled 2020-05-27 (×3): qty 1

## 2020-05-27 MED ORDER — ADULT MULTIVITAMIN W/MINERALS CH
1.0000 | ORAL_TABLET | Freq: Every day | ORAL | Status: DC
  Administered 2020-05-28 – 2020-06-07 (×10): 1
  Filled 2020-05-27 (×13): qty 1

## 2020-05-27 MED ORDER — PROSOURCE TF PO LIQD
90.0000 mL | Freq: Three times a day (TID) | ORAL | Status: DC
  Administered 2020-05-27 – 2020-05-28 (×6): 90 mL
  Filled 2020-05-27 (×6): qty 90

## 2020-05-27 MED ORDER — VITAL HIGH PROTEIN PO LIQD: 1000.0000 mL | ORAL | Status: DC

## 2020-05-27 MED ORDER — VITAL HIGH PROTEIN PO LIQD
1000.0000 mL | ORAL | Status: DC
  Administered 2020-05-28: 1000 mL

## 2020-05-27 NOTE — Progress Notes (Signed)
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 Pt. Relates that he was told that it could be surgically deflated,which patient declined due to concerns about some risk of stroke associated with this procedure---presents today to the ED after falling at home and found to have altered mentation with persistent hypercapnia despite BiPAP use in the ED  9/27: Patient was admitted with combined hypoxemic and hypercapnic respiratory failure secondary to combined CHF and COPD exacerbation for which he is required intubation.  He remains on IV Lasix for diuresis as well as breathing treatments and Solu-Medrol.  PCCM following and weaning ventilator as tolerated.  9/28: Patient continues to have significant diuresis with 1.5 L urine output yesterday.  He remains on FiO2 75%.  PCCM plans for chest x-ray in a.m. and decrease Solu-Medrol to 40 mg every 24 hours today.  2D echocardiogram performed 9/27 with LVEF 55-60% and indeterminate diastolic pressures.  Small pericardial effusion noted.  Continue ongoing management.  Assessment & Plan:   Principal Problem:   Acute respiratory failure with hypoxia and hypercapnia (HCC) Active Problems:   Essential hypertension   Alcohol abuse   Sleeps in sitting position due to orthopnea   OSA and COPD overlap syndrome (HCC)   Hyponatremia   Orthostatic hypotension   Nicotine abuse   COPD (chronic obstructive  pulmonary disease)/Emphysema   Acute exacerbation of CHF (congestive heart failure) (HCC)   COPD with acute exacerbation (HCC)   Acute hypoxemic and hypercapnic respiratory failure-multifactorial -Related to COPD and CHF exacerbation requiring intubation -Appreciate PCCM assistance with weaning of ventilator, PEEP increased to 10 on 9/28 to wean FiO2 further -Hold Lasix today and continue on Solu-Medrol once a day as ordered by PCCM -Chest x-ray planned for 9/29  Acute CHF exacerbation -Systolic versus diastolic with 2D echocardiogram with small pericardial effusion and LVEF 55-60% performed on 9/27 -Creatinine noted to be elevated at 1.2 today, discontinue IV Lasix for now and can resume once decreased urine noted  Acute COPD exacerbation with OSA overlap -Continue IV steroids and bronchodilators -Appreciate PCCM assistance while on ventilator -Procalcitonin less than 0.10, plan to continue doxycycline for now while intubated  Acute hypercapnic encephalopathy -CT head with no acute findings -PCO2 noted to be 103 on admission despite BiPAP use  AKI -Baseline creatinine 0.7  -Current creatinine 1.2 -Hold further Lasix today  Hyponatremia/hypochloremia-improved -Much improved with diuresis, holding for now -Could be related to beer [place]oto mania  History of alcohol and tobacco abuse -Patient will need nicotine patch after extubation and further counseling  History of orthostatic hypotension with recurrent falls -Cortisol noted to be 4.4 -Can consider mineralocorticoid on dc as needed  History of dural arteriovenous malformation of brain -Patient was recommended image guided diagnostic cerebral arteriogram with intervention by NIR, but refused  DVT prophylaxis:Heparin Code Status: Full code Family Communication: Discussed with son on phone 9/28 Disposition Plan:  Status is: Inpatient  Remains inpatient appropriate because:Altered mental status, IV treatments  appropriate due to intensity of illness or inability to take PO and Inpatient level of care appropriate due to  severity of illness   Dispo: The patient is from: Home  Anticipated d/c is to: Home  Anticipated d/c date is: 3 days  Patient currently is not medically stable to d/c.  Continue to manage COPD and CHF exacerbation and extubate once able.   Consultants:   PCCM  Procedures:   See below s/p intubation 9/26  Antimicrobials:  Anti-infectives (From admission, onward)   Start     Dose/Rate Route Frequency Ordered Stop   05/25/20 1445  doxycycline (VIBRAMYCIN) 100 mg in sodium chloride 0.9 % 250 mL IVPB        100 mg 125 mL/hr over 120 Minutes Intravenous Every 12 hours 05/25/20 1437     05/25/20 1400  doxycycline (VIBRA-TABS) tablet 100 mg  Status:  Discontinued        100 mg Oral Every 12 hours 05/25/20 1345 05/25/20 1437      Subjective: Patient seen and evaluated today with no new acute complaints or concerns. No acute concerns or events noted overnight.  He remains intubated on ventilator with high O2 requirements.  Objective: Vitals:   05/27/20 0600 05/27/20 0615 05/27/20 0630 05/27/20 0734  BP: 119/64 117/71 109/65   Pulse: 68 66 74   Resp: $Remo'18 18 18   'Lpomy$ Temp:    (!) 97.2 F (36.2 C)  TempSrc:    Oral  SpO2: 92% 92% 92%   Weight:      Height:        Intake/Output Summary (Last 24 hours) at 05/27/2020 1139 Last data filed at 05/27/2020 0649 Gross per 24 hour  Intake 2329.32 ml  Output 1550 ml  Net 779.32 ml   Filed Weights   05/25/20 1421 05/26/20 0530 05/27/20 0500  Weight: 118.1 kg 113.2 kg 116.7 kg    Examination:  General exam: Appears sedated and intubated on ventilator Respiratory system: Clear to auscultation. Respiratory effort normal. Cardiovascular system: S1 & S2 heard, RRR. Gastrointestinal system: Abdomen is nondistended, soft and nontender.  Central nervous system: Sedated on  ventilator Extremities: No significant edema, SCDs present Skin: No rashes, lesions or ulcers Psychiatry: Cannot be evaluated given current condition.    Data Reviewed: I have personally reviewed following labs and imaging studies  CBC: Recent Labs  Lab 05/25/20 0830 05/26/20 0701 05/27/20 0457  WBC 7.4 3.1* 8.2  NEUTROABS 4.8  --   --   HGB 16.7 16.6 16.9  HCT 52.7* 48.8 50.3  MCV 99.1 92.6 93.0  PLT 238 244 856   Basic Metabolic Panel: Recent Labs  Lab 05/25/20 0830 05/26/20 0701 05/27/20 0457  NA 128* 136 136  K 4.7 3.3* 3.9  CL 89* 93* 94*  CO2 $Re'29 31 31  'YRm$ GLUCOSE 100* 129* 131*  BUN 58* 43* 45*  CREATININE 1.49* 1.06 1.21  CALCIUM 8.5* 8.1* 8.2*  MG  --  1.9 1.9   GFR: Estimated Creatinine Clearance: 73.8 mL/min (by C-G formula based on SCr of 1.21 mg/dL). Liver Function Tests: Recent Labs  Lab 05/25/20 0830  AST 22  ALT 15  ALKPHOS 62  BILITOT 1.3*  PROT 6.5  ALBUMIN 3.4*   No results for input(s): LIPASE, AMYLASE in the last 168 hours. No results for input(s): AMMONIA in the last 168 hours. Coagulation Profile: No results for input(s): INR, PROTIME in the last 168 hours. Cardiac Enzymes: No results for input(s): CKTOTAL, CKMB, CKMBINDEX, TROPONINI in the last 168 hours. BNP (last 3 results) No results for input(s): PROBNP in the last 8760 hours. HbA1C: No results for  input(s): HGBA1C in the last 72 hours. CBG: Recent Labs  Lab 05/26/20 1130 05/26/20 1622 05/26/20 2351 05/27/20 0652 05/27/20 0736  GLUCAP 136* 138* 142* 130* 138*   Lipid Profile: Recent Labs    05/26/20 0701 05/27/20 0457  TRIG 66 61   Thyroid Function Tests: No results for input(s): TSH, T4TOTAL, FREET4, T3FREE, THYROIDAB in the last 72 hours. Anemia Panel: No results for input(s): VITAMINB12, FOLATE, FERRITIN, TIBC, IRON, RETICCTPCT in the last 72 hours. Sepsis Labs: Recent Labs  Lab 05/25/20 0926 05/25/20 1129 05/26/20 0701  PROCALCITON  --   --  <0.10   LATICACIDVEN 1.0 0.9  --     Recent Results (from the past 240 hour(s))  Urine culture     Status: Abnormal   Collection Time: 05/25/20  8:31 AM   Specimen: Urine, Clean Catch  Result Value Ref Range Status   Specimen Description   Final    URINE, CLEAN CATCH Performed at [facility], [address] [place], [place] [address]    Special Requests   Final    NONE Performed at [facility], [address] [place], [place] [address]    Culture MULTIPLE SPECIES PRESENT, SUGGEST RECOLLECTION (A)  Final   Report Status 05/26/2020 FINAL  Final  Respiratory Panel by RT PCR (Flu A&B, Covid) - Nasopharyngeal Swab     Status: None   Collection Time: 05/25/20  8:33 AM   Specimen: Nasopharyngeal Swab  Result Value Ref Range Status   SARS Coronavirus 2 by RT PCR NEGATIVE NEGATIVE Final    Comment: (NOTE) SARS-CoV-2 target nucleic acids are NOT DETECTED.  The SARS-CoV-2 RNA is generally detectable in upper respiratoy specimens during the acute phase of infection. The lowest concentration of SARS-CoV-2 viral copies this assay can detect is 131 copies/mL. A negative result does not preclude SARS-Cov-2 infection and should not be used as the sole basis for treatment or other patient management decisions. A negative result may occur with  improper specimen collection/handling, submission of specimen other than nasopharyngeal swab, presence of viral mutation(s) within the areas targeted by this assay, and inadequate number of viral copies (<131 copies/mL). A negative result must be combined with clinical observations, patient history, and epidemiological information. The expected result is Negative.  Fact Sheet for Patients:  PinkCheek.be  Fact Sheet for Healthcare Providers:  GravelBags.it  This test is no t yet approved or cleared by the [place] FDA and  has been authorized for detection and/or diagnosis of SARS-CoV-2  by FDA under an Emergency Use Authorization (EUA). This EUA will remain  in effect (meaning this test can be used) for the duration of the COVID-19 declaration under Section 564(b)(1) of the Act, 21 U.S.C. section 360bbb-3(b)(1), unless the authorization is terminated or revoked sooner.     Influenza A by PCR NEGATIVE NEGATIVE Final   Influenza B by PCR NEGATIVE NEGATIVE Final    Comment: (NOTE) The Xpert Xpress SARS-CoV-2/FLU/RSV assay is intended as an aid in  the diagnosis of influenza from Nasopharyngeal swab specimens and  should not be used as a sole basis for treatment. Nasal washings and  aspirates are unacceptable for Xpert Xpress SARS-CoV-2/FLU/RSV  testing.  Fact Sheet for Patients: PinkCheek.be  Fact Sheet for Healthcare Providers: GravelBags.it  This test is not yet approved or cleared by the [place] FDA and  has been authorized for detection and/or diagnosis of SARS-CoV-2 by  FDA under an Emergency Use Authorization (EUA). This EUA will remain  in effect (meaning  this test can be used) for the duration of the  Covid-19 declaration under Section 564(b)(1) of the Act, 21  U.S.C. section 360bbb-3(b)(1), unless the authorization is  terminated or revoked. Performed at [facility], [address], [place], [place] [address]   Culture, blood (routine x 2)     Status: None (Preliminary result)   Collection Time: 05/25/20  9:26 AM   Specimen: Right Antecubital; Blood  Result Value Ref Range Status   Specimen Description   Final    RIGHT ANTECUBITAL BOTTLES DRAWN AEROBIC AND ANAEROBIC   Special Requests Blood Culture adequate volume  Final   Culture   Final    NO GROWTH 1 DAY Performed at [facility], [address], [place], [place] [address]    Report Status PENDING  Incomplete  Culture, blood (routine x 2)     Status: None (Preliminary result)   Collection Time: 05/25/20  9:26 AM   Specimen: BLOOD  RIGHT HAND  Result Value Ref Range Status   Specimen Description BLOOD RIGHT HAND BOTTLES DRAWN AEROBIC ONLY  Final   Special Requests   Final    Blood Culture results may not be optimal due to an inadequate volume of blood received in culture bottles   Culture   Final    NO GROWTH 1 DAY Performed at [facility], [address] [place], [place] [address]    Report Status PENDING  Incomplete  MRSA PCR Screening     Status: None   Collection Time: 05/25/20  2:11 PM   Specimen: Nasal Mucosa; Nasopharyngeal  Result Value Ref Range Status   MRSA by PCR NEGATIVE NEGATIVE Final    Comment:        The GeneXpert MRSA Assay (FDA approved for NASAL specimens only), is one component of a comprehensive MRSA colonization surveillance program. It is not intended to diagnose MRSA infection nor to guide or monitor treatment for MRSA infections. Performed at [facility], [address] [place], [place] [address]   Culture, respiratory (non-expectorated)     Status: None (Preliminary result)   Collection Time: 05/26/20  8:49 AM   Specimen: Tracheal Aspirate; Respiratory  Result Value Ref Range Status   Specimen Description   Final    TRACHEAL ASPIRATE Performed at [facility], [address] [place], [place] [address]    Special Requests   Final    Normal Performed at [facility], [address] [place], [place] [address]    Gram Stain   Final    RARE WBC PRESENT,BOTH PMN AND MONONUCLEAR FEW GRAM POSITIVE COCCI IN PAIRS RARE GRAM NEGATIVE RODS    Culture   Final    CULTURE REINCUBATED FOR BETTER GROWTH Performed at [facility], [place] [address] [place],  [address]    Report Status PENDING  Incomplete         Radiology Studies: CT HEAD WO CONTRAST  Result Date: 05/25/2020 CLINICAL DATA:  Mental status change EXAM: CT HEAD WITHOUT CONTRAST TECHNIQUE: Contiguous axial images were obtained from the base of the skull through the vertex without intravenous contrast.  COMPARISON:  05/25/2019 MRI head. FINDINGS: Brain: No acute infarct or intracranial hemorrhage. No mass lesion. No midline shift, ventriculomegaly or extra-axial fluid collection. Vascular: Right temporal calcifications likely correspond to known dural arteriovenous malformation, better demonstrated on prior MRI. Bilateral carotid siphon atherosclerotic calcifications. Skull: Negative for fracture or focal lesion. Sinuses/Orbits: Normal orbits. Mild pansinus mucosal thickening. No mastoid effusion. Other: None. IMPRESSION: No acute infarct  or intracranial hemorrhage. Sequela of right temporal dural AVM. Electronically Signed   By: Primitivo Gauze M.D.   On: 05/25/2020 14:17   DG CHEST PORT 1 VIEW  Result Date: 05/26/2020 CLINICAL DATA:  Dizziness after a fall yesterday. History of prostate cancer and COPD. Smoker. EXAM: PORTABLE CHEST 1 VIEW COMPARISON:  05/25/2020 FINDINGS: Enteric and endotracheal tubes are unchanged in position since prior study. Shallow inspiration. Diffuse cardiac enlargement. Bilateral interstitial pattern to the lungs consistent with edema. Bilateral pleural effusions. Calcification of the aorta. IMPRESSION: Cardiac enlargement with bilateral interstitial edema and bilateral pleural effusions. Appliances are unchanged in position. Electronically Signed   By: Lucienne Capers M.D.   On: 05/26/2020 06:24   ECHOCARDIOGRAM COMPLETE  Result Date: 05/26/2020    ECHOCARDIOGRAM REPORT   Patient Name:   KADIN CANIPE Date of Exam: 05/26/2020 Medical Rec #:  [id number]      Height:       72.0 in Accession #:    [id number]     Weight:       249.6 lb Date of Birth:  1951-07-14      BSA:          2.341 m Patient Age:    18 years       BP:           105/54 mmHg Patient Gender: M              HR:           66 bpm. Exam Location:  [facility] Procedure: 2D Echo and Intracardiac Opacification Agent Indications:    Dyspnea 786.09 / R06.00  History:        Patient has no prior history of  Echocardiogram examinations.                 CHF, COPD; Risk Factors:Current Smoker and Sleep Apnea. Past                 history of Orthostatic hypotension and cancer.  Sonographer:    Darlina Sicilian RDCS Referring Phys: [phone] [facility]  Sonographer Comments: Echo performed with patient supine and on artificial respirator. IMPRESSIONS  1. Left ventricular ejection fraction, by estimation, is 55 to 60%. The left ventricle has normal function. The left ventricle has no regional wall motion abnormalities. There is mild left ventricular hypertrophy. Left ventricular diastolic parameters are indeterminate.  2. Right ventricular systolic function is moderately reduced. The right ventricular size is moderately enlarged.  3. A small pericardial effusion is present. The pericardial effusion is circumferential. There is no evidence of cardiac tamponade.  4. The mitral valve was not well visualized. No evidence of mitral valve regurgitation. No evidence of mitral stenosis.  5. The aortic valve is tricuspid. Aortic valve regurgitation is not visualized. No aortic stenosis is present.  6. The inferior vena cava is dilated in size with <50% respiratory variability, suggesting right atrial pressure of 15 mmHg.  7. Technically difficult study FINDINGS  Left Ventricle: Left ventricular ejection fraction, by estimation, is 55 to 60%. The left ventricle has normal function. The left ventricle has no regional wall motion abnormalities. Definity contrast agent was given IV to delineate the left ventricular  endocardial borders. The left ventricular internal cavity size was normal in size. There is mild left ventricular hypertrophy. Left ventricular diastolic parameters are indeterminate. Right Ventricle: The right ventricular size is moderately enlarged. Right vetricular wall thickness was not well visualized. Right ventricular systolic function is moderately reduced.  Left Atrium: Left atrial size was not well visualized. Right  Atrium: Right atrial size was not well visualized. Pericardium: A small pericardial effusion is present. The pericardial effusion is circumferential. There is no evidence of cardiac tamponade. Mitral Valve: The mitral valve was not well visualized. No evidence of mitral valve regurgitation. No evidence of mitral valve stenosis. Tricuspid Valve: The tricuspid valve is not well visualized. Tricuspid valve regurgitation is trivial. No evidence of tricuspid stenosis. Aortic Valve: The aortic valve is tricuspid. Aortic valve regurgitation is not visualized. No aortic stenosis is present. Pulmonic Valve: The pulmonic valve was not well visualized. Pulmonic valve regurgitation is not visualized. No evidence of pulmonic stenosis. Aorta: The aortic root is normal in size and structure. Venous: The inferior vena cava is dilated in size with less than 50% respiratory variability, suggesting right atrial pressure of 15 mmHg. IAS/Shunts: The interatrial septum was not well visualized.  LEFT VENTRICLE PLAX 2D LVIDd:         5.44 cm      Diastology LVIDs:         3.73 cm      LV e' medial:    3.71 cm/s LV PW:         1.19 cm      LV E/e' medial:  18.4 LV IVS:        1.25 cm      LV e' lateral:   5.51 cm/s LVOT diam:     2.30 cm      LV E/e' lateral: 12.4 LV SV:         81 LV SV Index:   35 LVOT Area:     4.15 cm  LV Volumes (MOD) LV vol d, MOD A2C: 134.0 ml LV vol d, MOD A4C: 150.0 ml LV vol s, MOD A2C: 91.4 ml LV vol s, MOD A4C: 123.0 ml LV SV MOD A2C:     42.6 ml LV SV MOD A4C:     150.0 ml LV SV MOD BP:      31.8 ml LEFT ATRIUM           Index       RIGHT ATRIUM           Index LA diam:      3.50 cm 1.50 cm/m  RA Area:     25.00 cm LA Vol (A2C): 35.6 ml 15.21 ml/m RA Volume:   81.00 ml  34.60 ml/m LA Vol (A4C): 81.2 ml 34.69 ml/m  AORTIC VALVE LVOT Vmax:   95.30 cm/s LVOT Vmean:  62.700 cm/s LVOT VTI:    0.196 m  AORTA Ao Root diam: 3.60 cm MITRAL VALVE               TRICUSPID VALVE MV Area (PHT): 2.78 cm    TR Peak  grad:   24.4 mmHg MV Decel Time: 273 msec    TR Vmax:        247.00 cm/s MV E velocity: 68.10 cm/s MV A velocity: 75.00 cm/s  SHUNTS MV E/A ratio:  0.91        Systemic VTI:  0.20 m                            Systemic Diam: 2.30 cm Dina Rich MD Electronically signed by Dina Rich MD Signature Date/Time: 05/26/2020/4:03:26 PM    Final         Scheduled Meds: . acetaminophen (TYLENOL) oral liquid  160 mg/5 mL  325 mg Per Tube Q6H  . chlorhexidine gluconate (MEDLINE KIT)  15 mL Mouth Rinse BID  . Chlorhexidine Gluconate Cloth  6 each Topical Daily  . docusate  100 mg Per Tube BID  . feeding supplement (PROSource TF)  90 mL Per Tube TID  . [START ON 05/28/2020] feeding supplement (VITAL HIGH PROTEIN)  1,000 mL Per Tube Q24H  . furosemide  40 mg Intravenous Q12H  . heparin  5,000 Units Subcutaneous Q8H  . mouth rinse  15 mL Mouth Rinse 10 times per day  . [START ON 05/28/2020] methylPREDNISolone (SOLU-MEDROL) injection  40 mg Intravenous Q24H  . multivitamin with minerals  1 tablet Per Tube Daily  . polyethylene glycol  17 g Oral Daily  . sodium chloride flush  3 mL Intravenous Q12H  . sodium chloride flush  3 mL Intravenous Q12H   Continuous Infusions: . sodium chloride    . doxycycline (VIBRAMYCIN) IV Stopped (05/27/20 0501)  . famotidine (PEPCID) IV Stopped (05/26/20 1153)  . fentaNYL infusion INTRAVENOUS 50 mcg/hr (05/27/20 0649)  . propofol (DIPRIVAN) infusion Stopped (05/25/20 1725)  . propofol (DIPRIVAN) infusion 35 mcg/kg/min (05/27/20 0941)     LOS: 2 days    Time spent: 35 minutes    Roniel Halloran D Manuella Ghazi, DO [facility]  If 7PM-7AM, please contact night-coverage [email or web address] 05/27/2020, 11:39 AM

## 2020-05-27 NOTE — Progress Notes (Signed)
NAME:  Steven Howard, MRN:  [id number], DOB:  [birth year], LOS: 2 ADMISSION DATE:  05/25/2020, CONSULTATION DATE:  05/27/2020  REFERRING MD:  Manuella Ghazi, triad MD CHIEF COMPLAINT:  Vent management   Brief History   69 year old man with COPD, OSA admitted with acute on chronic hypercarbic respiratory failure due to CHF/COPD exacerbation  History of present illness   70 year old smoker with known COPD, BIBEMS for altered mental status and after a fall at home, found to have ABG 7.15/103/140 on 100% FiO2 after 2 hours of BiPAP and required mechanical ventilation.  BNP was 455, creatinine 1.5, sodium 128, chest x-ray showed bilateral interstitial prominence consistent with CHF and trace bilateral effusions. He was admitted to ICU, sedated, diuresed.  He was also treated with IV steroids bronchodilators and doxycycline for an acute COPD exacerbation.  PCCM consulted to assist with ICU management Patient is currently unresponsive and history is obtained after review of chart  Past Medical History  COPD/emphysema OSA, noncompliant with CPAP AV malformation of right temporal lobe , declined neuro IR intervention Orthostatic hypotension  Significant Hospital Events   9/26 admitted, intubated  Consults:    Procedures:  ETT 9/26 >>  Significant Diagnostic Tests:  Head CT 9/26 >> no change in dural AVM Echo 7/27 >> normal LVEF, decreased RV SF  Micro Data:  resp 9/27 >> rare GNR >> [facility] 9/26 >>ng  Antimicrobials:   doxy 9/27 >>  Interim history/subjective:   Hemodynamically stable Remains intubated and sedated , on 70%/PEEP8  Afebrile Good urine output with Lasix  Objective   Blood pressure 109/65, pulse 74, temperature (!) 97.2 F (36.2 C), temperature source Oral, resp. rate 18, height 6' (1.829 m), weight 116.7 kg, SpO2 92 %.    Vent Mode: PRVC FiO2 (%):  [55 %-75 %] 70 % Set Rate:  [18 bmp] 18 bmp Vt Set:  [620 mL] 620 mL PEEP:  [8 cmH20] 8 cmH20 Plateau Pressure:  [19 cmH20-20  cmH20] 20 cmH20   Intake/Output Summary (Last 24 hours) at 05/27/2020 0834 Last data filed at 05/27/2020 8882 Gross per 24 hour  Intake 2329.32 ml  Output 1550 ml  Net 779.32 ml   Filed Weights   05/25/20 1421 05/26/20 0530 05/27/20 0500  Weight: 118.1 kg 113.2 kg 116.7 kg    Examination: Gen:      elderly man, no distress , sedated on fentanyl and propofol HEENT:  EOMI, sclera anicteric, mild pallor Neck:     No JVD; no thyromegaly Lungs:   Bilateral ventilated breath sounds, no rhonchi CV:         Regular rate and rhythm; no murmurs Abd:      + bowel sounds; soft, non-tender; no palpable masses, no distension Ext:    No edema; adequate peripheral perfusion Skin:      Warm and dry; no rash Neuro: Opens eyes spontaneously, RASS 0, does not follow commands    Chest x-ray 9/27 independently reviewed shows pulmonary vascular congestion with small bilateral effusions Improved shows decreased,)   Labs show normal electrolytes, no leukocytosis  Resolved Hospital Problem list     Assessment & Plan:  Acute hypercarbic respiratory failure likely combination of heart failure and COPD exacerbation, no evidence of pneumonia or acute coronary ischemia -Peak pressure is 28, plateau 19, no evidence of bronchospasm  -Vent settings reviewed and adjusted -Drop RR to 15 -Increase PEEP of 10 to enable further drop in FiO2 - can drop Solu-Medrol to 40 every 24  -He has diuresed  about 7 L with Lasix, monitor creatinine and electrolytes but remains hypoxic -given hypercarbia, doubt rehab to consider other etiologies such as PE -CXR in am  -Can decrease fent for goal RASS 0 to -1, plan to start weaning soon    Best practice:  Diet: NPO ,start TFs Pain/Anxiety/Delirium protocol (if indicated): Propofol/fentanyl VAP protocol (if indicated): Y DVT prophylaxis: Subcu Lovenox GI prophylaxis: Protonix Glucose control: SSI Mobility: Bedrest Code Status: Full Family Communication: Per  primary Disposition: ICU  Labs   CBC: Recent Labs  Lab 05/25/20 0830 05/26/20 0701 05/27/20 0457  WBC 7.4 3.1* 8.2  NEUTROABS 4.8  --   --   HGB 16.7 16.6 16.9  HCT 52.7* 48.8 50.3  MCV 99.1 92.6 93.0  PLT 238 244 920    Basic Metabolic Panel: Recent Labs  Lab 05/25/20 0830 05/26/20 0701 05/27/20 0457  NA 128* 136 136  K 4.7 3.3* 3.9  CL 89* 93* 94*  CO2 29 31 31   GLUCOSE 100* 129* 131*  BUN 58* 43* 45*  CREATININE 1.49* 1.06 1.21  CALCIUM 8.5* 8.1* 8.2*  MG  --  1.9 1.9   GFR: Estimated Creatinine Clearance: 73.8 mL/min (by C-G formula based on SCr of 1.21 mg/dL). Recent Labs  Lab 05/25/20 0830 05/25/20 0926 05/25/20 1129 05/26/20 0701 05/27/20 0457  PROCALCITON  --   --   --  <0.10  --   WBC 7.4  --   --  3.1* 8.2  LATICACIDVEN  --  1.0 0.9  --   --     Liver Function Tests: Recent Labs  Lab 05/25/20 0830  AST 22  ALT 15  ALKPHOS 62  BILITOT 1.3*  PROT 6.5  ALBUMIN 3.4*   No results for input(s): LIPASE, AMYLASE in the last 168 hours. No results for input(s): AMMONIA in the last 168 hours.  ABG    Component Value Date/Time   PHART 7.498 (H) 05/27/2020 0410   PCO2ART 43.6 05/27/2020 0410   PO2ART 69.8 (L) 05/27/2020 0410   HCO3 32.8 (H) 05/27/2020 0410   O2SAT 92.8 05/27/2020 0410     Critical care time: 11m     Kara Mead MD. FCCP. [facility] See Amion for pager  If no response to pager , please call [phone]  After 7:00 pm call Elink  [phone]     05/27/2020

## 2020-05-27 NOTE — Progress Notes (Signed)
Initial Nutrition Assessment  DOCUMENTATION CODES:   Obesity unspecified  INTERVENTION:  Vital High Protein @ 45 ml/hr with 90 ml ProSource TF 3x/daily -This regimen provides 1320 kcal (1822 kcal total with propofol at current rate), 161 grams protein, and 907 ml free water  MVI with minerals daily via tube  Additional free water per CCM  Recommend Folic acid and Thiamine daily given reported current alcohol use  Monitor magnesium, potassium, and phosphorus daily for at least 3 days, MD to replete as needed, as pt is at risk for refeeding syndrome given alcohol abuse and unknown nutrition history  NUTRITION DIAGNOSIS:   Inadequate oral intake related to inability to eat as evidenced by NPO status.    GOAL:   Provide needs based on ASPEN/SCCM guidelines    MONITOR:   Weight trends, Labs, I & O's, PO intake, Vent status, TF tolerance, Diet advancement  REASON FOR ASSESSMENT:   Consult Enteral/tube feeding initiation and management  ASSESSMENT:  RD working remotely.  69 year old male with history of alcohol abuse, COPD/emphysema, OSA noncompliant with CPAP, recurrent orthostatic hypotension with history of falls associated with AV malformation of right temporal lobe presented due to altered mentation after unwitnessed fall with persistent hypercapnia  Patient admitted with acute on chronic hypercarbic respiratory failure due to CHF/COPD exacerbation.  Persistent hypercapnia despite 2 hours of BiPAP and requiring mechanical ventilation.   Per chart, weights have trended up ~24 lbs over the past 4 months. Suspect this is due to fluid shift given CHF. Pt diuresed ~7L with Lasix since admit.   BP: 123/68 (cuff) MAP: 87 (cuff)   I/O: -6321 ml since admit UOP: 1550 ml x 24 hrs  Patient is currently sedated and intubated on ventilator support MV: 11 L/min Temp (24hrs), Avg:97.5 F (36.4 C), Min:97.2 F (36.2 C), Max:97.8 F (36.6 C)  Propofol: 19.03 ml/hr provides  502 kcal  Medications reviewed and include: Colace, Miralax, Lasix 40 mg IV every 12 hrs Drips: Doxycycline, Pepcid Fentanyl 50 mcg/hr  Labs: CBGs 138,130,142, K 3.9 (WNL) replaced, Mg 1.9 (WNL) 9/26 BNP 455 (H)  NUTRITION - FOCUSED PHYSICAL EXAM: Unable to complete at this time, RD working remotely.   Diet Order:   Diet Order            Diet NPO time specified  Diet effective now                 EDUCATION NEEDS:   No education needs have been identified at this time  Skin:  Skin Assessment: Skin Integrity Issues: Skin Integrity Issues:: Other (Comment) Other: MASD; perineal  Last BM:  pta  Height:   Ht Readings from Last 1 Encounters:  05/25/20 6' (1.829 m)    Weight:   Wt Readings from Last 1 Encounters:  05/27/20 116.7 kg    Ideal Body Weight:  80.9 kg  BMI:  Body mass index is 34.89 kg/m.  Estimated Nutritional Needs: Based on 113.2 kg  Kcal:  1358-1585 (12-14 kcal/kg)  Protein:  162 (2g/kg/IBW)  Fluid:  >1.3 L/day   Lajuan Lines, RD, LDN Clinical Nutrition After Hours/Weekend Pager # in [place]

## 2020-05-28 ENCOUNTER — Inpatient Hospital Stay (HOSPITAL_COMMUNITY): Payer: Medicare HMO

## 2020-05-28 DIAGNOSIS — I10 Essential (primary) hypertension: Secondary | ICD-10-CM

## 2020-05-28 DIAGNOSIS — E871 Hypo-osmolality and hyponatremia: Secondary | ICD-10-CM

## 2020-05-28 DIAGNOSIS — G4733 Obstructive sleep apnea (adult) (pediatric): Secondary | ICD-10-CM

## 2020-05-28 DIAGNOSIS — F1010 Alcohol abuse, uncomplicated: Secondary | ICD-10-CM | POA: Diagnosis not present

## 2020-05-28 DIAGNOSIS — J441 Chronic obstructive pulmonary disease with (acute) exacerbation: Secondary | ICD-10-CM | POA: Diagnosis not present

## 2020-05-28 DIAGNOSIS — J449 Chronic obstructive pulmonary disease, unspecified: Secondary | ICD-10-CM

## 2020-05-28 DIAGNOSIS — R0601 Orthopnea: Secondary | ICD-10-CM

## 2020-05-28 DIAGNOSIS — Z720 Tobacco use: Secondary | ICD-10-CM

## 2020-05-28 LAB — CBC
HCT: 50.1 % (ref 39.0–52.0)
Hemoglobin: 16.4 g/dL (ref 13.0–17.0)
MCH: 31.4 pg (ref 26.0–34.0)
MCHC: 32.7 g/dL (ref 30.0–36.0)
MCV: 96 fL (ref 80.0–100.0)
Platelets: 215 10*3/uL (ref 150–400)
RBC: 5.22 MIL/uL (ref 4.22–5.81)
RDW: 16 % — ABNORMAL HIGH (ref 11.5–15.5)
WBC: 9.6 10*3/uL (ref 4.0–10.5)
nRBC: 0 % (ref 0.0–0.2)

## 2020-05-28 LAB — CULTURE, RESPIRATORY W GRAM STAIN
Culture: NORMAL
Special Requests: NORMAL

## 2020-05-28 LAB — BASIC METABOLIC PANEL
Anion gap: 10 (ref 5–15)
BUN: 40 mg/dL — ABNORMAL HIGH (ref 8–23)
CO2: 32 mmol/L (ref 22–32)
Calcium: 8.4 mg/dL — ABNORMAL LOW (ref 8.9–10.3)
Chloride: 96 mmol/L — ABNORMAL LOW (ref 98–111)
Creatinine, Ser: 0.93 mg/dL (ref 0.61–1.24)
GFR calc Af Amer: >60 mL/min (ref >60)
GFR calc non Af Amer: >60 mL/min (ref >60)
Glucose, Bld: 97 mg/dL (ref 70–99)
Potassium: 3.8 mmol/L (ref 3.5–5.1)
Sodium: 138 mmol/L (ref 135–145)

## 2020-05-28 LAB — GLUCOSE, CAPILLARY
Glucose-Capillary: 110 mg/dL — ABNORMAL HIGH (ref 70–99)
Glucose-Capillary: 99 mg/dL (ref 70–99)
Glucose-Capillary: 107 mg/dL — ABNORMAL HIGH (ref 70–99)
Glucose-Capillary: 101 mg/dL — ABNORMAL HIGH (ref 70–99)
Glucose-Capillary: 127 mg/dL — ABNORMAL HIGH (ref 70–99)

## 2020-05-28 LAB — PHOSPHORUS: Phosphorus: 3.4 mg/dL (ref 2.5–4.6)

## 2020-05-28 LAB — TRIGLYCERIDES: Triglycerides: 70 mg/dL (ref <150)

## 2020-05-28 LAB — MAGNESIUM: Magnesium: 2 mg/dL (ref 1.7–2.4)

## 2020-05-28 NOTE — Progress Notes (Signed)
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 Pt. Relates that he was told that it could be surgically deflated,which patient declined due to concerns about some risk of stroke associated with this procedure---presents today to the ED after falling at home and found to have altered mentation with persistent hypercapnia despite BiPAP use in the ED  9/27: Patient was admitted with combined hypoxemic and hypercapnic respiratory failure secondary to combined CHF and COPD exacerbation for which he is required intubation.  He remains on IV Lasix for diuresis as well as breathing treatments and Solu-Medrol.  PCCM following and weaning ventilator as tolerated.  9/28: Patient continues to have significant diuresis with 1.5 L urine output yesterday.  He remains on FiO2 75%.  PCCM plans for chest x-ray in a.m. and decrease Solu-Medrol to 40 mg every 24 hours today.  2D echocardiogram performed 9/27 with LVEF 55-60% and indeterminate diastolic pressures.  Small pericardial effusion noted.  Continue ongoing management.  Assessment & Plan:   Principal Problem:   Acute respiratory failure with hypoxia and hypercapnia (HCC) Active Problems:   Essential hypertension   Alcohol abuse   Sleeps in sitting position due to orthopnea   OSA and COPD overlap syndrome (HCC)   Hyponatremia   Orthostatic hypotension   Nicotine abuse   COPD (chronic obstructive  pulmonary disease)/Emphysema   Acute exacerbation of CHF (congestive heart failure) (HCC)   COPD with acute exacerbation (HCC)   Acute hypoxemic and hypercapnic respiratory failure-multifactorial -Related to COPD and CHF exacerbation requiring intubation -Appreciate PCCM assistance with weaning of ventilator  Acute CHF exacerbation -Systolic versus diastolic with 2D echocardiogram with small pericardial effusion and LVEF 55-60% performed on 9/27 -Creatinine stable, discontinued IV Lasix 9/28  Acute COPD exacerbation with OSA overlap -Continue IV steroids and bronchodilators -Appreciate PCCM assistance while on ventilator -Procalcitonin less than 0.10, plan to continue doxycycline for now while intubated  Acute hypercapnic encephalopathy -CT head with no acute findings -PCO2 noted to be 103 on admission despite BiPAP use  AKI - resolved -Baseline creatinine 0.7  -Current creatinine 1.2 -Hold further Lasix today  Hyponatremia/hypochloremia-improved -Much improved with diuresis, holding for now -Could be related to beer Potomania  History of alcohol and tobacco abuse -Patient will need nicotine patch after extubation and further counseling  History of orthostatic hypotension with recurrent falls -Cortisol noted to be 4.4 -Can consider mineralocorticoid on dc as needed  History of dural arteriovenous malformation of brain -Patient was recommended image guided diagnostic cerebral arteriogram with intervention by NIR, but refused  DVT prophylaxis:Heparin Code Status: Full code Family Communication: Discussed with son on phone 9/28 Disposition Plan: TBD Status is: Inpatient  Remains inpatient appropriate because:Altered mental status, IV treatments appropriate due to intensity of illness or inability to take PO and Inpatient level of care appropriate due to severity of illness  Dispo: The patient is from: Home  Anticipated d/c is to:  Home  Anticipated d/c date is: 3 days  Patient currently is not medically stable to d/c.  Continue to manage COPD and CHF exacerbation and extubate  once able.   Consultants:   PCCM  Procedures:   See below s/p intubation 9/26  Antimicrobials:  Anti-infectives (From admission, onward)   Start     Dose/Rate Route Frequency Ordered Stop   05/25/20 1445  doxycycline (VIBRAMYCIN) 100 mg in sodium chloride 0.9 % 250 mL IVPB        100 mg 125 mL/hr over 120 Minutes Intravenous Every 12 hours 05/25/20 1437     05/25/20 1400  doxycycline (VIBRA-TABS) tablet 100 mg  Status:  Discontinued        100 mg Oral Every 12 hours 05/25/20 1345 05/25/20 1437      Subjective: Patient remains sedated on vent, he is somnolent.    Objective: Vitals:   05/28/20 0700 05/28/20 0800 05/28/20 0900 05/28/20 1100  BP: 125/72 133/79 (!) 124/28   Pulse: (!) 54 (!) 58 (!) 58   Resp: $Remo'15 17 15   'bYRNY$ Temp: (!) 96.4 F (35.8 C)   97.7 F (36.5 C)  TempSrc: Axillary   Axillary  SpO2: 92% 96% 96%   Weight:      Height:        Intake/Output Summary (Last 24 hours) at 05/28/2020 1139 Last data filed at 05/28/2020 0938 Gross per 24 hour  Intake 1648.53 ml  Output 800 ml  Net 848.53 ml   Filed Weights   05/25/20 1421 05/26/20 0530 05/27/20 0500  Weight: 118.1 kg 113.2 kg 116.7 kg   Examination:  General exam: sedated and intubated on ventilator Respiratory system: good air movement, Clear to auscultation. Respiratory effort normal. Cardiovascular system: normal S1 & S2 heard, RRR. Gastrointestinal system: Abdomen is nondistended, soft and nontender.  Central nervous system: Sedated on ventilator Extremities: No significant edema, SCDs present Skin: No rashes, lesions or ulcers Psychiatry: Cannot be evaluated given current condition.  Data Reviewed: I have personally reviewed following labs and imaging studies  CBC: Recent Labs  Lab 05/25/20 0830 05/26/20 0701  05/27/20 0457 05/28/20 0408  WBC 7.4 3.1* 8.2 9.6  NEUTROABS 4.8  --   --   --   HGB 16.7 16.6 16.9 16.4  HCT 52.7* 48.8 50.3 50.1  MCV 99.1 92.6 93.0 96.0  PLT 238 244 259 993   Basic Metabolic Panel: Recent Labs  Lab 05/25/20 0830 05/26/20 0701 05/27/20 0457 05/28/20 0408  NA 128* 136 136 138  K 4.7 3.3* 3.9 3.8  CL 89* 93* 94* 96*  CO2 $Re'29 31 31 'fBA$ 32  GLUCOSE 100* 129* 131* 97  BUN 58* 43* 45* 40*  CREATININE 1.49* 1.06 1.21 0.93  CALCIUM 8.5* 8.1* 8.2* 8.4*  MG  --  1.9 1.9 2.0  PHOS  --   --   --  3.4   GFR: Estimated Creatinine Clearance: 96 mL/min (by C-G formula based on SCr of 0.93 mg/dL). Liver Function Tests: Recent Labs  Lab 05/25/20 0830  AST 22  ALT 15  ALKPHOS 62  BILITOT 1.3*  PROT 6.5  ALBUMIN 3.4*   No results for input(s): LIPASE, AMYLASE in the last 168 hours. No results for input(s): AMMONIA in the last 168 hours. Coagulation Profile: No results for input(s): INR, PROTIME in the last 168 hours. Cardiac Enzymes: No results for input(s): CKTOTAL, CKMB, CKMBINDEX, TROPONINI in the last 168 hours. BNP (last 3 results) No results for input(s): PROBNP in the last 8760 hours. HbA1C: No results for input(s): HGBA1C in the last 72 hours. CBG: Recent Labs  Lab 05/27/20 2055 05/27/20 2357 05/28/20 0433 05/28/20 0728 05/28/20 1108  GLUCAP 111* 113* 110* 99 107*   Lipid Profile: Recent Labs    05/27/20 0457 05/28/20 0408  TRIG 61 70   Thyroid Function Tests: No results for input(s): TSH, T4TOTAL, FREET4, T3FREE, THYROIDAB in the last 72 hours. Anemia Panel: No results for input(s): VITAMINB12, FOLATE, FERRITIN, TIBC, IRON, RETICCTPCT in the last 72 hours. Sepsis Labs: Recent Labs  Lab 05/25/20 0926 05/25/20 1129 05/26/20 0701  PROCALCITON  --   --  <0.10  LATICACIDVEN 1.0 0.9  --     Recent Results (from the past 240 hour(s))  Urine culture     Status: Abnormal   Collection Time: 05/25/20  8:31 AM   Specimen: Urine, Clean Catch   Result Value Ref Range Status   Specimen Description   Final    URINE, CLEAN CATCH Performed at [facility], [address] [place], [place] [address]    Special Requests   Final    NONE Performed at [facility], [address] [place], [place] [address]    Culture MULTIPLE SPECIES PRESENT, SUGGEST RECOLLECTION (A)  Final   Report Status 05/26/2020 FINAL  Final  Respiratory Panel by RT PCR (Flu A&B, Covid) - Nasopharyngeal Swab     Status: None   Collection Time: 05/25/20  8:33 AM   Specimen: Nasopharyngeal Swab  Result Value Ref Range Status   SARS Coronavirus 2 by RT PCR NEGATIVE NEGATIVE Final    Comment: (NOTE) SARS-CoV-2 target nucleic acids are NOT DETECTED.  The SARS-CoV-2 RNA is generally detectable in upper respiratoy specimens during the acute phase of infection. The lowest concentration of SARS-CoV-2 viral copies this assay can detect is 131 copies/mL. A negative result does not preclude SARS-Cov-2 infection and should not be used as the sole basis for treatment or other patient management decisions. A negative result may occur with  improper specimen collection/handling, submission of specimen other than nasopharyngeal swab, presence of viral mutation(s) within the areas targeted by this assay, and inadequate number of viral copies (<131 copies/mL). A negative result must be combined with clinical observations, patient history, and epidemiological information. The expected result is Negative.  Fact Sheet for Patients:  https://www.moore.com/  Fact Sheet for Healthcare Providers:  https://www.young.biz/  This test is no t yet approved or cleared by the [place] FDA and  has been authorized for detection and/or diagnosis of SARS-CoV-2 by FDA under an Emergency Use Authorization (EUA). This EUA will remain  in effect (meaning this test can be used) for the duration of the COVID-19 declaration under Section 564(b)(1) of  the Act, 21 U.S.C. section 360bbb-3(b)(1), unless the authorization is terminated or revoked sooner.     Influenza A by PCR NEGATIVE NEGATIVE Final   Influenza B by PCR NEGATIVE NEGATIVE Final    Comment: (NOTE) The Xpert Xpress SARS-CoV-2/FLU/RSV assay is intended as an aid in  the diagnosis of influenza from Nasopharyngeal swab specimens and  should not be used as a sole basis for treatment. Nasal washings and  aspirates are unacceptable for Xpert Xpress SARS-CoV-2/FLU/RSV  testing.  Fact Sheet for Patients: https://www.moore.com/  Fact Sheet for Healthcare Providers: https://www.young.biz/  This test is not yet approved or cleared by the [place] FDA and  has been authorized for detection and/or diagnosis of SARS-CoV-2 by  FDA under an Emergency Use Authorization (EUA). This EUA will remain  in effect (meaning this test can be used) for the duration of the  Covid-19 declaration under Section 564(b)(1) of the Act, 21  U.S.C. section  360bbb-3(b)(1), unless the authorization is  terminated or revoked. Performed at [facility], [address] [place], [place] [address]   Culture, blood (routine x 2)     Status: None (Preliminary result)   Collection Time: 05/25/20  9:26 AM   Specimen: Right Antecubital; Blood  Result Value Ref Range Status   Specimen Description   Final    RIGHT ANTECUBITAL BOTTLES DRAWN AEROBIC AND ANAEROBIC   Special Requests Blood Culture adequate volume  Final   Culture   Final    NO GROWTH 3 DAYS Performed at [facility], [address] [place], [place] [address]    Report Status PENDING  Incomplete  Culture, blood (routine x 2)     Status: None (Preliminary result)   Collection Time: 05/25/20  9:26 AM   Specimen: BLOOD RIGHT HAND  Result Value Ref Range Status   Specimen Description BLOOD RIGHT HAND BOTTLES DRAWN AEROBIC ONLY  Final   Special Requests   Final    Blood Culture results may not be optimal  due to an inadequate volume of blood received in culture bottles   Culture   Final    NO GROWTH 3 DAYS Performed at [facility], [address] [place], [place] [address]    Report Status PENDING  Incomplete  MRSA PCR Screening     Status: None   Collection Time: 05/25/20  2:11 PM   Specimen: Nasal Mucosa; Nasopharyngeal  Result Value Ref Range Status   MRSA by PCR NEGATIVE NEGATIVE Final    Comment:        The GeneXpert MRSA Assay (FDA approved for NASAL specimens only), is one component of a comprehensive MRSA colonization surveillance program. It is not intended to diagnose MRSA infection nor to guide or monitor treatment for MRSA infections. Performed at [facility], [address] [place], [place] [address]   Culture, respiratory (non-expectorated)     Status: None   Collection Time: 05/26/20  8:49 AM   Specimen: Tracheal Aspirate; Respiratory  Result Value Ref Range Status   Specimen Description   Final    TRACHEAL ASPIRATE Performed at [facility], [address] [place], [place] [address]    Special Requests   Final    Normal Performed at [facility], [address], [place], [place] [address]    Gram Stain   Final    RARE WBC PRESENT,BOTH PMN AND MONONUCLEAR FEW GRAM POSITIVE COCCI IN PAIRS RARE GRAM NEGATIVE RODS    Culture   Final    MODERATE Normal respiratory flora-no Staph aureus or Pseudomonas seen Performed at [facility], [place] [address], [place], [place] [address]    Report Status 05/28/2020 FINAL  Final    Radiology Studies: DG CHEST PORT 1 VIEW  Result Date: 05/28/2020 CLINICAL DATA:  Acute respiratory failure and OG tube placement EXAM: PORTABLE CHEST 1 VIEW COMPARISON:  Radiograph 05/25/2020 FINDINGS: Tip of the transesophageal tube terminates in the left upper quadrant with the side port closely approximating the expected location of the GE junction. Could consider advancing 3-5 cm for optimal positioning. Endotracheal tube tip  terminates in the mid trachea, 3.2 cm from the carina. Telemetry leads overlie the chest. Surgical clip projects over the right heart border. Bilateral pleural effusions, right greater than left with adjacent opacities, likely passive atelectasis. Some diffuse hazy interstitial opacity is central pulmonary vascular congestion is noted as well. Cardiomediastinal contours are similar to prior counting for differences in technique. The aorta is calcified. The  remaining cardiomediastinal contours are unremarkable. IMPRESSION: Tip of the transesophageal tube terminates in the left upper quadrant with the side port closely approximating the expected location of the GE junction. Could consider advancing 3-5 cm for optimal positioning. Stable satisfactory positioning of the endotracheal tube. Features suggesting CHF/volume overload with cardiomegaly, vascular congestion, bilateral effusions and interstitial edema. Opacities adjacent the pleural effusions likely passive atelectatic change though underlying consolidation is not excluded. Electronically Signed   By: Lovena Le M.D.   On: 05/28/2020 00:44   ECHOCARDIOGRAM COMPLETE  Result Date: 05/26/2020    ECHOCARDIOGRAM REPORT   Patient Name:   LESS WOOLSEY Date of Exam: 05/26/2020 Medical Rec #:  [id number]      Height:       72.0 in Accession #:    [id number]     Weight:       249.6 lb Date of Birth:  Apr 16, 1951      BSA:          2.341 m Patient Age:    37 years       BP:           105/54 mmHg Patient Gender: M              HR:           66 bpm. Exam Location:  [facility] Procedure: 2D Echo and Intracardiac Opacification Agent Indications:    Dyspnea 786.09 / R06.00  History:        Patient has no prior history of Echocardiogram examinations.                 CHF, COPD; Risk Factors:Current Smoker and Sleep Apnea. Past                 history of Orthostatic hypotension and cancer.  Sonographer:    Darlina Sicilian RDCS Referring Phys: [phone] [facility]   Sonographer Comments: Echo performed with patient supine and on artificial respirator. IMPRESSIONS  1. Left ventricular ejection fraction, by estimation, is 55 to 60%. The left ventricle has normal function. The left ventricle has no regional wall motion abnormalities. There is mild left ventricular hypertrophy. Left ventricular diastolic parameters are indeterminate.  2. Right ventricular systolic function is moderately reduced. The right ventricular size is moderately enlarged.  3. A small pericardial effusion is present. The pericardial effusion is circumferential. There is no evidence of cardiac tamponade.  4. The mitral valve was not well visualized. No evidence of mitral valve regurgitation. No evidence of mitral stenosis.  5. The aortic valve is tricuspid. Aortic valve regurgitation is not visualized. No aortic stenosis is present.  6. The inferior vena cava is dilated in size with <50% respiratory variability, suggesting right atrial pressure of 15 mmHg.  7. Technically difficult study FINDINGS  Left Ventricle: Left ventricular ejection fraction, by estimation, is 55 to 60%. The left ventricle has normal function. The left ventricle has no regional wall motion abnormalities. Definity contrast agent was given IV to delineate the left ventricular  endocardial borders. The left ventricular internal cavity size was normal in size. There is mild left ventricular hypertrophy. Left ventricular diastolic parameters are indeterminate. Right Ventricle: The right ventricular size is moderately enlarged. Right vetricular wall thickness was not well visualized. Right ventricular systolic function is moderately reduced. Left Atrium: Left atrial size was not well visualized. Right Atrium: Right atrial size was not well visualized. Pericardium: A small pericardial effusion is present. The pericardial effusion is circumferential. There is no  evidence of cardiac tamponade. Mitral Valve: The mitral valve was not well  visualized. No evidence of mitral valve regurgitation. No evidence of mitral valve stenosis. Tricuspid Valve: The tricuspid valve is not well visualized. Tricuspid valve regurgitation is trivial. No evidence of tricuspid stenosis. Aortic Valve: The aortic valve is tricuspid. Aortic valve regurgitation is not visualized. No aortic stenosis is present. Pulmonic Valve: The pulmonic valve was not well visualized. Pulmonic valve regurgitation is not visualized. No evidence of pulmonic stenosis. Aorta: The aortic root is normal in size and structure. Venous: The inferior vena cava is dilated in size with less than 50% respiratory variability, suggesting right atrial pressure of 15 mmHg. IAS/Shunts: The interatrial septum was not well visualized.  LEFT VENTRICLE PLAX 2D LVIDd:         5.44 cm      Diastology LVIDs:         3.73 cm      LV e' medial:    3.71 cm/s LV PW:         1.19 cm      LV E/e' medial:  18.4 LV IVS:        1.25 cm      LV e' lateral:   5.51 cm/s LVOT diam:     2.30 cm      LV E/e' lateral: 12.4 LV SV:         81 LV SV Index:   35 LVOT Area:     4.15 cm  LV Volumes (MOD) LV vol d, MOD A2C: 134.0 ml LV vol d, MOD A4C: 150.0 ml LV vol s, MOD A2C: 91.4 ml LV vol s, MOD A4C: 123.0 ml LV SV MOD A2C:     42.6 ml LV SV MOD A4C:     150.0 ml LV SV MOD BP:      31.8 ml LEFT ATRIUM           Index       RIGHT ATRIUM           Index LA diam:      3.50 cm 1.50 cm/m  RA Area:     25.00 cm LA Vol (A2C): 35.6 ml 15.21 ml/m RA Volume:   81.00 ml  34.60 ml/m LA Vol (A4C): 81.2 ml 34.69 ml/m  AORTIC VALVE LVOT Vmax:   95.30 cm/s LVOT Vmean:  62.700 cm/s LVOT VTI:    0.196 m  AORTA Ao Root diam: 3.60 cm MITRAL VALVE               TRICUSPID VALVE MV Area (PHT): 2.78 cm    TR Peak grad:   24.4 mmHg MV Decel Time: 273 msec    TR Vmax:        247.00 cm/s MV E velocity: 68.10 cm/s MV A velocity: 75.00 cm/s  SHUNTS MV E/A ratio:  0.91        Systemic VTI:  0.20 m                            Systemic Diam: 2.30 cm Carlyle Dolly MD Electronically signed by Carlyle Dolly MD Signature Date/Time: 05/26/2020/4:03:26 PM    Final    Scheduled Meds: . acetaminophen (TYLENOL) oral liquid 160 mg/5 mL  325 mg Per Tube Q6H  . chlorhexidine gluconate (MEDLINE KIT)  15 mL Mouth Rinse BID  . Chlorhexidine Gluconate Cloth  6 each Topical Daily  . docusate  100 mg Per  Tube BID  . feeding supplement (PROSource TF)  90 mL Per Tube TID  . feeding supplement (VITAL HIGH PROTEIN)  1,000 mL Per Tube Q24H  . heparin  5,000 Units Subcutaneous Q8H  . mouth rinse  15 mL Mouth Rinse 10 times per day  . methylPREDNISolone (SOLU-MEDROL) injection  40 mg Intravenous Q24H  . multivitamin with minerals  1 tablet Per Tube Daily  . polyethylene glycol  17 g Oral Daily  . sodium chloride flush  3 mL Intravenous Q12H  . sodium chloride flush  3 mL Intravenous Q12H   Continuous Infusions: . sodium chloride    . doxycycline (VIBRAMYCIN) IV Stopped (05/28/20 0354)  . famotidine (PEPCID) IV Stopped (05/27/20 1507)  . fentaNYL infusion INTRAVENOUS Stopped (05/28/20 0832)  . propofol (DIPRIVAN) infusion 35 mcg/kg/min (05/28/20 0624)  . propofol (DIPRIVAN) infusion 35 mcg/kg/min (05/28/20 0938)     LOS: 3 days   Critical Care Procedure Note Authorized and Performed by: Murvin Natal MD  Total Critical Care time:  36 mins  Due to a high probability of clinically significant, life threatening deterioration, the patient required my highest level of preparedness to intervene emergently and I personally spent this critical care time directly and personally managing the patient.  This critical care time included obtaining a history; examining the patient, pulse oximetry; ordering and review of studies; arranging urgent treatment with development of a management plan; evaluation of patient's response of treatment; frequent reassessment; and discussions with other providers.  This critical care time was performed to assess and manage the high probability  of imminent and life threatening deterioration that could result in multi-organ failure.  It was exclusive of separately billable procedures and treating other patients and teaching time.   Irwin Brakeman, MD How to contact the [facility] Attending or Consulting provider [place] or covering provider during after hours [place], for this patient?  1. Check the care team in [facility] and look for a) attending/consulting [facility] provider listed and b) the [facility] team listed 2. Log into [email or web address] and use [place]'s universal password to access. If you do not have the password, please contact the hospital operator. 3. Locate the [facility] provider you are looking for under [facility] and page to a number that you can be directly reached. 4. If you still have difficulty reaching the provider, please page the [facility] (Director on Call) for the Hospitalists listed on amion for assistance.  [facility]  If 7PM-7AM, please contact night-coverage [email or web address] 05/28/2020, 11:39 AM

## 2020-05-28 NOTE — Progress Notes (Signed)
NAME:  Steven Howard, MRN:  [id number], DOB:  [birth year], LOS: 3 ADMISSION DATE:  05/25/2020, CONSULTATION DATE:  05/28/2020  REFERRING MD:  Manuella Ghazi, triad MD CHIEF COMPLAINT:  Vent management   Brief History   69 year old man with COPD, OSA admitted with acute on chronic hypercarbic respiratory failure due to CHF/COPD exacerbation  History of present illness   69 year old smoker with known COPD, BIBEMS for altered mental status and after a fall at home, found to have ABG 7.15/103/140 on 100% FiO2 after 2 hours of BiPAP and required mechanical ventilation.  BNP was 455, creatinine 1.5, sodium 128, chest x-ray showed bilateral interstitial prominence consistent with CHF and trace bilateral effusions. He was admitted to ICU, sedated, diuresed.  He was also treated with IV steroids bronchodilators and doxycycline for an acute COPD exacerbation.  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 Afebrile Sedated on propofol/fentanyl drips  Objective   Blood pressure 117/70, pulse 77, temperature 97.7 F (36.5 C), temperature source Axillary, resp. rate 10, height 6' (1.829 m), weight 116.7 kg, SpO2 99 %.    Vent Mode: CPAP;PSV FiO2 (%):  [50 %-60 %] 50 % Set Rate:  [15 bmp] 15 bmp Vt Set:  [620 mL] 620 mL PEEP:  [8 cmH20-10 cmH20] 8 cmH20 Pressure Support:  [10 cmH20] 10 cmH20 Plateau Pressure:  [17 cmH20-21 cmH20] 20 cmH20   Intake/Output Summary  (Last 24 hours) at 05/28/2020 1447 Last data filed at 05/28/2020 1247 Gross per 24 hour  Intake 1698.53 ml  Output 1800 ml  Net -101.47 ml   Filed Weights   05/25/20 1421 05/26/20 0530 05/27/20 0500  Weight: 118.1 kg 113.2 kg 116.7 kg    Examination: Gen:      elderly man, no distress , sedated on fentanyl and propofol HEENT:  EOMI, sclera anicteric, mild pallor Neck:     No JVD; no thyromegaly Lungs:   Bilateral ventilated breath sounds, no rhonchi CV:         Regular rate and rhythm; no murmurs Abd:      + bowel sounds; soft, non-tender; no palpable masses, no distension Ext:    No edema; adequate peripheral perfusion Skin:      Warm and dry; no rash Neuro: Sedate, RASS -2, does not follow commands    Chest x-ray 9/29 independently reviewed, new OG tube appears to be in position, cardiomegaly with bilateral pulmonary vascular congestion, no effusions   Labs show no leukocytosis, low triglycerides, normal electrolytes  Resolved Hospital Problem list     Assessment & Plan:  Acute hypercarbic respiratory failure likely combination of heart failure and COPD exacerbation, no evidence of pneumonia or acute coronary ischemia - no evidence of bronchospasm  -Vent settings reviewed and adjusted -Decrease PEEP to 8 and start spontaneous breathing trials, hypoxia is finally improving -Continue Solu-Medrol to 40 every 24 , can eventually change to prednisone 40 mg with slow taper over 2 weeks -Can  change fentanyl to intermittent  Adrenal insufficiency -low cortisol 4.4 noted, also frequent falls with history of orthostatic hypotension -May need low-dose steroids and consider adding fludrocortisone as outpatient  Best practice:  Diet: NPO , TFs Pain/Anxiety/Delirium protocol (if indicated): Propofol/fentanyl VAP protocol (if indicated): Y DVT prophylaxis: Subcu Lovenox GI prophylaxis: Protonix Glucose control: SSI Mobility: Bedrest Code Status: Full Family Communication: Per  primary Disposition: ICU  Labs   CBC: Recent Labs  Lab 05/25/20 0830 05/26/20 0701 05/27/20 0457 05/28/20 0408  WBC 7.4 3.1* 8.2 9.6  NEUTROABS 4.8  --   --   --   HGB 16.7 16.6 16.9 16.4  HCT 52.7* 48.8 50.3 50.1  MCV 99.1 92.6 93.0 96.0  PLT 238 244 259 993    Basic Metabolic Panel: Recent Labs  Lab 05/25/20 0830 05/26/20 0701 05/27/20 0457 05/28/20 0408  NA 128* 136 136 138  K 4.7 3.3* 3.9 3.8  CL 89* 93* 94* 96*  CO2 29 31 31  32  GLUCOSE 100* 129* 131* 97  BUN 58* 43* 45* 40*  CREATININE 1.49* 1.06 1.21 0.93  CALCIUM 8.5* 8.1* 8.2* 8.4*  MG  --  1.9 1.9 2.0  PHOS  --   --   --  3.4   GFR: Estimated Creatinine Clearance: 96 mL/min (by C-G formula based on SCr of 0.93 mg/dL). Recent Labs  Lab 05/25/20 0830 05/25/20 0926 05/25/20 1129 05/26/20 0701 05/27/20 0457 05/28/20 0408  PROCALCITON  --   --   --  <0.10  --   --   WBC 7.4  --   --  3.1* 8.2 9.6  LATICACIDVEN  --  1.0 0.9  --   --   --     Liver Function Tests: Recent Labs  Lab 05/25/20 0830  AST 22  ALT 15  ALKPHOS 62  BILITOT 1.3*  PROT 6.5  ALBUMIN 3.4*   No results for input(s): LIPASE, AMYLASE in the last 168 hours. No results for input(s): AMMONIA in the last 168 hours.  ABG    Component Value Date/Time   PHART 7.498 (H) 05/27/2020 0410   PCO2ART 43.6 05/27/2020 0410   PO2ART 69.8 (L) 05/27/2020 0410   HCO3 32.8 (H) 05/27/2020 0410   O2SAT 92.8 05/27/2020 0410     Critical care time: [place] MD. FCCP. [facility] See Amion for pager  If no response to pager , please call [phone]  After 7:00 pm call Elink  [phone]   05/28/2020      05/28/2020

## 2020-05-29 ENCOUNTER — Inpatient Hospital Stay (HOSPITAL_COMMUNITY): Payer: Medicare HMO

## 2020-05-29 DIAGNOSIS — F1010 Alcohol abuse, uncomplicated: Secondary | ICD-10-CM | POA: Diagnosis not present

## 2020-05-29 DIAGNOSIS — I5033 Acute on chronic diastolic (congestive) heart failure: Secondary | ICD-10-CM | POA: Diagnosis not present

## 2020-05-29 LAB — CBC
HCT: 53.2 % — ABNORMAL HIGH (ref 39.0–52.0)
Hemoglobin: 17.1 g/dL — ABNORMAL HIGH (ref 13.0–17.0)
MCH: 32 pg (ref 26.0–34.0)
MCHC: 32.1 g/dL (ref 30.0–36.0)
MCV: 99.4 fL (ref 80.0–100.0)
Platelets: 168 10*3/uL (ref 150–400)
RBC: 5.35 MIL/uL (ref 4.22–5.81)
RDW: 17.2 % — ABNORMAL HIGH (ref 11.5–15.5)
WBC: 8.7 10*3/uL (ref 4.0–10.5)
nRBC: 0 % (ref 0.0–0.2)

## 2020-05-29 LAB — BASIC METABOLIC PANEL
Anion gap: 10 (ref 5–15)
BUN: 36 mg/dL — ABNORMAL HIGH (ref 8–23)
CO2: 33 mmol/L — ABNORMAL HIGH (ref 22–32)
Calcium: 8.4 mg/dL — ABNORMAL LOW (ref 8.9–10.3)
Chloride: 96 mmol/L — ABNORMAL LOW (ref 98–111)
Creatinine, Ser: 0.73 mg/dL (ref 0.61–1.24)
GFR calc Af Amer: >60 mL/min (ref >60)
GFR calc non Af Amer: >60 mL/min (ref >60)
Glucose, Bld: 92 mg/dL (ref 70–99)
Potassium: 3.9 mmol/L (ref 3.5–5.1)
Sodium: 139 mmol/L (ref 135–145)

## 2020-05-29 LAB — GLUCOSE, CAPILLARY
Glucose-Capillary: 102 mg/dL — ABNORMAL HIGH (ref 70–99)
Glucose-Capillary: 107 mg/dL — ABNORMAL HIGH (ref 70–99)
Glucose-Capillary: 72 mg/dL (ref 70–99)
Glucose-Capillary: 89 mg/dL (ref 70–99)
Glucose-Capillary: 96 mg/dL (ref 70–99)

## 2020-05-29 LAB — TRIGLYCERIDES: Triglycerides: 481 mg/dL — ABNORMAL HIGH (ref <150)

## 2020-05-29 MED ORDER — ENSURE ENLIVE PO LIQD
237.0000 mL | Freq: Two times a day (BID) | ORAL | Status: DC
  Administered 2020-05-31 – 2020-06-07 (×16): 237 mL via ORAL

## 2020-05-29 MED ORDER — FUROSEMIDE 10 MG/ML IJ SOLN
40.0000 mg | Freq: Once | INTRAMUSCULAR | Status: AC
  Administered 2020-05-29: 40 mg via INTRAVENOUS
  Filled 2020-05-29: qty 4

## 2020-05-29 MED ORDER — NICOTINE 21 MG/24HR TD PT24
21.0000 mg | MEDICATED_PATCH | Freq: Every day | TRANSDERMAL | Status: DC | PRN
  Administered 2020-06-04 – 2020-06-07 (×4): 21 mg via TRANSDERMAL
  Filled 2020-05-29 (×5): qty 1

## 2020-05-29 MED ORDER — DOCUSATE SODIUM 100 MG PO CAPS
100.0000 mg | ORAL_CAPSULE | Freq: Two times a day (BID) | ORAL | Status: DC
  Administered 2020-05-30 – 2020-06-06 (×11): 100 mg via ORAL
  Filled 2020-05-29 (×18): qty 1

## 2020-05-29 MED ORDER — ACETAMINOPHEN 325 MG PO TABS
325.0000 mg | ORAL_TABLET | Freq: Four times a day (QID) | ORAL | Status: DC
  Administered 2020-05-30 – 2020-06-07 (×32): 325 mg via ORAL
  Filled 2020-05-29 (×32): qty 1

## 2020-05-29 NOTE — Progress Notes (Signed)
NAME:  Steven Howard, MRN:  [id number], DOB:  03-Apr-1951, LOS: 4 ADMISSION DATE:  05/25/2020, CONSULTATION DATE:  05/29/2020  REFERRING MD:  Manuella Ghazi, triad MD CHIEF COMPLAINT:  Vent management   Brief History   69 year old man with COPD, OSA admitted with acute on chronic hypercarbic respiratory failure due to CHF/COPD exacerbation  History of present illness   69 year old smoker with known COPD, BIBEMS for altered mental status and after a fall at home, found to have ABG 7.15/103/140 on 100% FiO2 after 2 hours of BiPAP and required mechanical ventilation.  BNP was 455, creatinine 1.5, sodium 128, chest x-ray showed bilateral interstitial prominence consistent with CHF and trace bilateral effusions. He was admitted to ICU, sedated, diuresed.  He was also treated with IV steroids bronchodilators and doxycycline for an acute COPD exacerbation.  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 rate 15, height 6' (1.829 m), weight 116.7 kg, SpO2 96 %.    Vent Mode: CPAP;PSV FiO2 (%):  [45 %-50 %] 45 % Set Rate:  [15 bmp] 15 bmp Vt Set:  [620 mL] 620 mL PEEP:  [5 cmH20-10 cmH20] 5 cmH20 Pressure Support:  [10 cmH20] 10 cmH20 Plateau Pressure:  [20  cmH20-22 cmH20] 20 cmH20   Intake/Output Summary (Last 24 hours) at 05/29/2020 0953 Last data filed at 05/29/2020 3893 Gross per 24 hour  Intake 1456.55 ml  Output 1000 ml  Net 456.55 ml   Filed Weights   05/26/20 0530 05/27/20 0500 05/29/20 0530  Weight: 113.2 kg 116.7 kg 116.7 kg    Examination: Gen:      elderly man, no distress  HEENT:  EOMI, sclera anicteric, mild pallor Neck:     No JVD; no thyromegaly Lungs:   Faint expiratory rhonchi scattered rhonchi, no accessory muscle use CV:         Regular rate and rhythm; no murmurs Abd:      + bowel sounds; soft, non-tender; no palpable masses, no distension Ext:    No edema; adequate peripheral perfusion Skin:      Warm and dry; no rash Neuro: Calm on propofol, RASS +1, follows one-step commands     Chest x-ray 9/29 independently reviewed -improved pulmonary vascular congestion Labs show slight high triglycerides, no leukocytosis, normal electrolytes, slight increase in hemoglobin 17.1  Resolved Hospital Problem list     Assessment & Plan:  Acute hypercarbic respiratory failure likely combination of heart failure and COPD exacerbation, no evidence of pneumonia or acute coronary ischemia - no evidence of bronchospasm  -Drop PEEP to 5, tolerates spontaneous breathing trials, was weaned on pressure support 5/5 and proceed with extubation -Continue Solu-Medrol to 40 every 24 , can  change tomorrow to prednisone 40 mg with slow taper over  2 weeks -If he does well, can advance with sips and chips after 4 to 6 hours. -Doubt that he needs nocturnal NIV   Adrenal insufficiency -low cortisol 4.4 noted, also frequent falls with history of orthostatic hypotension -May need low-dose steroids and consider adding fludrocortisone as outpatient  Smoking cessation paramount here .  Best practice:  Diet: NPO  Pain/Anxiety/Delirium protocol (if indicated): Propofol stopped VAP protocol (if indicated): Y DVT prophylaxis: Subcu  Lovenox GI prophylaxis: Protonix Glucose control: SSI Mobility: Bedrest Code Status: Full Family Communication: Per primary Disposition: ICU  Labs   CBC: Recent Labs  Lab 05/25/20 0830 05/26/20 0701 05/27/20 0457 05/28/20 0408 05/29/20 0610  WBC 7.4 3.1* 8.2 9.6 8.7  NEUTROABS 4.8  --   --   --   --   HGB 16.7 16.6 16.9 16.4 17.1*  HCT 52.7* 48.8 50.3 50.1 53.2*  MCV 99.1 92.6 93.0 96.0 99.4  PLT 238 244 259 215 109    Basic Metabolic Panel: Recent Labs  Lab 05/25/20 0830 05/26/20 0701 05/27/20 0457 05/28/20 0408 05/29/20 0610  NA 128* 136 136 138 139  K 4.7 3.3* 3.9 3.8 3.9  CL 89* 93* 94* 96* 96*  CO2 29 31 31  32 33*  GLUCOSE 100* 129* 131* 97 92  BUN 58* 43* 45* 40* 36*  CREATININE 1.49* 1.06 1.21 0.93 0.73  CALCIUM 8.5* 8.1* 8.2* 8.4* 8.4*  MG  --  1.9 1.9 2.0  --   PHOS  --   --   --  3.4  --    GFR: Estimated Creatinine Clearance: 111.6 mL/min (by C-G formula based on SCr of 0.73 mg/dL). Recent Labs  Lab 05/25/20 0830 05/25/20 0926 05/25/20 1129 05/26/20 0701 05/27/20 0457 05/28/20 0408 05/29/20 0610  PROCALCITON  --   --   --  <0.10  --   --   --   WBC   < >  --   --  3.1* 8.2 9.6 8.7  LATICACIDVEN  --  1.0 0.9  --   --   --   --    < > = values in this interval not displayed.    Liver Function Tests: Recent Labs  Lab 05/25/20 0830  AST 22  ALT 15  ALKPHOS 62  BILITOT 1.3*  PROT 6.5  ALBUMIN 3.4*   No results for input(s): LIPASE, AMYLASE in the last 168 hours. No results for input(s): AMMONIA in the last 168 hours.  ABG    Component Value Date/Time   PHART 7.498 (H) 05/27/2020 0410   PCO2ART 43.6 05/27/2020 0410   PO2ART 69.8 (L) 05/27/2020 0410   HCO3 32.8 (H) 05/27/2020 0410   O2SAT 92.8 05/27/2020 0410     Critical care time: [place] MD. FCCP. [facility] See Amion for pager  If no response to pager , please call [phone]  After 7:00 pm call Elink  [phone]     05/29/2020

## 2020-05-29 NOTE — Progress Notes (Signed)
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 Pt. Relates that he was told that it could be surgically deflated,which patient declined due to concerns about some risk of stroke associated with this procedure---presents today to the ED after falling at home and found to have altered mentation with persistent hypercapnia despite BiPAP use in the ED  9/27: Patient was admitted with combined hypoxemic and hypercapnic respiratory failure secondary to combined CHF and COPD exacerbation for which he is required intubation.  He remains on IV Lasix for diuresis as well as breathing treatments and Solu-Medrol.  PCCM following and weaning ventilator as tolerated.  9/28: Patient continues to have significant diuresis with 1.5 L urine output yesterday.  He remains on FiO2 75%.  PCCM plans for chest x-ray in a.m. and decrease Solu-Medrol to 40 mg every 24 hours today.  2D echocardiogram performed 9/27 with LVEF 55-60% and indeterminate diastolic pressures.  Small pericardial effusion noted.  Continue ongoing management.  9/30: Pt extubated.    Assessment & Plan:   Principal Problem:   Acute respiratory failure with hypoxia and hypercapnia (HCC) Active Problems:   Essential hypertension   Alcohol abuse   Sleeps in sitting position due to orthopnea   OSA and COPD overlap syndrome (HCC)   Hyponatremia   Orthostatic hypotension   Nicotine abuse   COPD  (chronic obstructive pulmonary disease)/Emphysema   Acute exacerbation of CHF (congestive heart failure) (HCC)   COPD with acute exacerbation (HCC)  Acute hypoxemic and hypercapnic respiratory failure-multifactorial -Related to COPD and CHF exacerbation requiring intubation -Appreciate PCCM assistance with weaning of ventilator -Plan is for him to extubate later today.   Acute CHF exacerbation -Systolic versus diastolic with 2D echocardiogram with small pericardial effusion and LVEF 55-60% performed on 9/27 -Creatinine stable, additional IV Lasix 9/30, recheck BMP in AM.   Acute COPD exacerbation with OSA overlap -Continue IV steroids and bronchodilators -Appreciate PCCM assistance while on ventilator -Procalcitonin less than 0.10, plan to continue doxycycline for now while intubated   Acute hypercapnic encephalopathy -CT head with no acute findings -PCO2 noted to be 103 on admission despite BiPAP use  AKI - resolved -Baseline creatinine 0.7   Hyponatremia/hypochloremia-resolved now after diuresis -Much improved with diuresis, holding for now -Could be related to beer Pot[place]ia  History of alcohol and tobacco abuse -Patient will need nicotine patch and further counseling  History of orthostatic hypotension with recurrent falls -Cortisol noted to be 4.4 -Can consider mineralocorticoid on dc as needed  History of dural arteriovenous malformation of brain -Patient was recommended image guided diagnostic cerebral arteriogram with intervention by NIR, but refused  DVT prophylaxis:Heparin Code Status: Full code Family Communication: Discussed with son on phone 9/28, 9/30 sister updated  Disposition Plan: TBD Status is: Inpatient  Remains inpatient appropriate because:Altered mental status, IV treatments appropriate due to intensity of illness or inability to take PO and Inpatient level of care appropriate due to severity of illness  Dispo: The patient is from:  Home  Anticipated d/c is to: Home  Anticipated d/c date is: 2-3 days  Patient currently is not medically stable to d/c. Pt was extubated today.  Remains on IV lasix for diuresis.  Transfer to stepdown ICU out of ICU.    Consultants:   PCCM  Procedures:   See below s/p intubation 9/26, extubated 9/30  Antimicrobials:  Anti-infectives (From admission, onward)   Start     Dose/Rate Route Frequency Ordered Stop   05/25/20 1445  doxycycline (VIBRAMYCIN) 100 mg in sodium chloride 0.9 % 250 mL IVPB        100 mg 125 mL/hr over 120 Minutes Intravenous Every 12 hours 05/25/20 1437     05/25/20 1400  doxycycline (VIBRA-TABS) tablet 100 mg  Status:  Discontinued        100 mg Oral Every 12 hours 05/25/20 1345 05/25/20 1437      Subjective: Patient was sedated on vent when seen this morning.    Objective: Vitals:   05/29/20 0800 05/29/20 0900 05/29/20 1100 05/29/20 1200  BP: 111/65 128/76 136/75   Pulse: 62 81 87   Resp: _0 Temp: (!) 96.8 F (36 C)   98.1 F (36.7 C)  TempSrc: Axillary   Oral  SpO2: 96% (!) 89% 94%   Weight:      Height:        Intake/Output Summary (Last 24 hours) at 05/29/2020 1439 Last data filed at 05/29/2020 0658 Gross per 24 hour  Intake 1406.55 ml  Output --  Net 1406.55 ml   Filed Weights   05/26/20 0530 05/27/20 0500 05/29/20 0530  Weight: 113.2 kg 116.7 kg 116.7 kg   Examination:  General exam: sedated and intubated on ventilator Respiratory system: good air movement, Clear to auscultation. Respiratory effort normal. Cardiovascular system: normal S1 & S2 heard.  Gastrointestinal system: Abdomen is nondistended, soft and nontender.  Central nervous system: Sedated on ventilator Extremities: No significant edema, SCDs present Skin: No rashes, lesions or ulcers Psychiatry: Cannot be evaluated given current condition.  Data Reviewed: I have personally reviewed following labs and imaging  studies  CBC: Recent Labs  Lab 05/25/20 0830 05/26/20 0701 05/27/20 0457 05/28/20 0408 05/29/20 0610  WBC 7.4 3.1* 8.2 9.6 8.7  NEUTROABS 4.8  --   --   --   --   HGB 16.7 16.6 16.9 16.4 17.1*  HCT 52.7* 48.8 50.3 50.1 53.2*  MCV 99.1 92.6 93.0 96.0 99.4  PLT 238 244 259 215 315   Basic Metabolic Panel: Recent Labs  Lab 05/25/20 0830 05/26/20 0701 05/27/20 0457 05/28/20 0408 05/29/20 0610  NA 128* 136 136 138 139  K 4.7 3.3* 3.9 3.8 3.9  CL 89* 93* 94* 96* 96*  CO2 _1 32 33*  GLUCOSE 100* 129* 131* 97 92  BUN 58* 43* 45* 40* 36*  CREATININE 1.49* 1.06 1.21 0.93 0.73  CALCIUM 8.5* 8.1* 8.2* 8.4* 8.4*  MG  --  1.9 1.9 2.0  --   PHOS  --   --   --  3.4  --    GFR: Estimated Creatinine Clearance: 111.6 mL/min (by C-G formula based on SCr of 0.73 mg/dL). Liver Function Tests: Recent Labs  Lab 05/25/20 0830  AST 22  ALT 15  ALKPHOS 62  BILITOT 1.3*  PROT 6.5  ALBUMIN 3.4*   No results for input(s): LIPASE, AMYLASE in the last 168 hours. No results for input(s): AMMONIA in the last 168 hours. Coagulation Profile: No results for input(s): INR, PROTIME in the last 168 hours. Cardiac Enzymes: No results  for input(s): CKTOTAL, CKMB, CKMBINDEX, TROPONINI in the last 168 hours. BNP (last 3 results) No results for input(s): PROBNP in the last 8760 hours. HbA1C: No results for input(s): HGBA1C in the last 72 hours. CBG: Recent Labs  Lab 05/28/20 2016 05/29/20 0002 05/29/20 0332 05/29/20 0732 05/29/20 1129  GLUCAP 127* 96 72 89 107*   Lipid Profile: Recent Labs    05/28/20 0408 05/29/20 0610  TRIG 70 481*   Thyroid Function Tests: No results for input(s): TSH, T4TOTAL, FREET4, T3FREE, THYROIDAB in the last 72 hours. Anemia Panel: No results for input(s): VITAMINB12, FOLATE, FERRITIN, TIBC, IRON, RETICCTPCT in the last 72 hours. Sepsis Labs: Recent Labs  Lab 05/25/20 0926 05/25/20 1129 05/26/20 0701  PROCALCITON  --   --  <0.10  LATICACIDVEN  1.0 0.9  --     Recent Results (from the past 240 hour(s))  Urine culture     Status: Abnormal   Collection Time: 05/25/20  8:31 AM   Specimen: Urine, Clean Catch  Result Value Ref Range Status   Specimen Description   Final    URINE, CLEAN CATCH Performed at [facility], [address] [place], [place] [address]    Special Requests   Final    NONE Performed at [facility], [address], [place], [place] [address]    Culture MULTIPLE SPECIES PRESENT, SUGGEST RECOLLECTION (A)  Final   Report Status 05/26/2020 FINAL  Final  Respiratory Panel by RT PCR (Flu A&B, Covid) - Nasopharyngeal Swab     Status: None   Collection Time: 05/25/20  8:33 AM   Specimen: Nasopharyngeal Swab  Result Value Ref Range Status   SARS Coronavirus 2 by RT PCR NEGATIVE NEGATIVE Final    Comment: (NOTE) SARS-CoV-2 target nucleic acids are NOT DETECTED.  The SARS-CoV-2 RNA is generally detectable in upper respiratoy specimens during the acute phase of infection. The lowest concentration of SARS-CoV-2 viral copies this assay can detect is 131 copies/mL. A negative result does not preclude SARS-Cov-2 infection and should not be used as the sole basis for treatment or other patient management decisions. A negative result may occur with  improper specimen collection/handling, submission of specimen other than nasopharyngeal swab, presence of viral mutation(s) within the areas targeted by this assay, and inadequate number of viral copies (<131 copies/mL). A negative result must be combined with clinical observations, patient history, and epidemiological information. The expected result is Negative.  Fact Sheet for Patients:  PinkCheek.be  Fact Sheet for Healthcare Providers:  GravelBags.it  This test is no t yet approved or cleared by the [place] FDA and  has been authorized for detection and/or diagnosis of SARS-CoV-2 by FDA under an  Emergency Use Authorization (EUA). This EUA will remain  in effect (meaning this test can be used) for the duration of the COVID-19 declaration under Section 564(b)(1) of the Act, 21 U.S.C. section 360bbb-3(b)(1), unless the authorization is terminated or revoked sooner.     Influenza A by PCR NEGATIVE NEGATIVE Final   Influenza B by PCR NEGATIVE NEGATIVE Final    Comment: (NOTE) The Xpert Xpress SARS-CoV-2/FLU/RSV assay is intended as an aid in  the diagnosis of influenza from Nasopharyngeal swab specimens and  should not be used as a sole basis for treatment. Nasal washings and  aspirates are unacceptable for Xpert Xpress SARS-CoV-2/FLU/RSV  testing.  Fact Sheet for Patients: PinkCheek.be  Fact Sheet for Healthcare Providers: GravelBags.it  This test is not yet approved or cleared by the [place]  FDA and  has been authorized for detection and/or diagnosis of SARS-CoV-2 by  FDA under an Emergency Use Authorization (EUA). This EUA will remain  in effect (meaning this test can be used) for the duration of the  Covid-19 declaration under Section 564(b)(1) of the Act, 21  U.S.C. section 360bbb-3(b)(1), unless the authorization is  terminated or revoked. Performed at [facility], [address], [place], [place] [address]   Culture, blood (routine x 2)     Status: None (Preliminary result)   Collection Time: 05/25/20  9:26 AM   Specimen: Right Antecubital; Blood  Result Value Ref Range Status   Specimen Description   Final    RIGHT ANTECUBITAL BOTTLES DRAWN AEROBIC AND ANAEROBIC   Special Requests Blood Culture adequate volume  Final   Culture   Final    NO GROWTH 4 DAYS Performed at [facility], [address] [place], [place] [address]    Report Status PENDING  Incomplete  Culture, blood (routine x 2)     Status: None (Preliminary result)   Collection Time: 05/25/20  9:26 AM   Specimen: BLOOD RIGHT HAND  Result  Value Ref Range Status   Specimen Description BLOOD RIGHT HAND BOTTLES DRAWN AEROBIC ONLY  Final   Special Requests   Final    Blood Culture results may not be optimal due to an inadequate volume of blood received in culture bottles   Culture   Final    NO GROWTH 4 DAYS Performed at [facility], [address] [place], [place] [address]    Report Status PENDING  Incomplete  MRSA PCR Screening     Status: None   Collection Time: 05/25/20  2:11 PM   Specimen: Nasal Mucosa; Nasopharyngeal  Result Value Ref Range Status   MRSA by PCR NEGATIVE NEGATIVE Final    Comment:        The GeneXpert MRSA Assay (FDA approved for NASAL specimens only), is one component of a comprehensive MRSA colonization surveillance program. It is not intended to diagnose MRSA infection nor to guide or monitor treatment for MRSA infections. Performed at [facility], [address] [place], [place] [address]   Culture, respiratory (non-expectorated)     Status: None   Collection Time: 05/26/20  8:49 AM   Specimen: Tracheal Aspirate; Respiratory  Result Value Ref Range Status   Specimen Description   Final    TRACHEAL ASPIRATE Performed at [facility], [address], [place], [place] [address]    Special Requests   Final    Normal Performed at [facility], [address], [place], [place] [address]    Gram Stain   Final    RARE WBC PRESENT,BOTH PMN AND MONONUCLEAR FEW GRAM POSITIVE COCCI IN PAIRS RARE GRAM NEGATIVE RODS    Culture   Final    MODERATE Normal respiratory flora-no Staph aureus or Pseudomonas seen Performed at [facility], [place] [address], [place], [place] [address]    Report Status 05/28/2020 FINAL  Final    Radiology Studies: DG CHEST PORT 1 VIEW  Result Date: 05/29/2020 CLINICAL DATA:  CHF exacerbation. EXAM: PORTABLE CHEST 1 VIEW COMPARISON:  05/28/2020. FINDINGS: Interim extubation and removal of NG tube. Cardiomegaly. Diffuse bilateral interstitial prominence again  noted. Interstitial edema and/or pneumonitis could present in this fashion. Persistent bibasilar atelectasis. No prominent pleural effusion. No pneumothorax. Degenerative change thoracic spine. IMPRESSION: 1. Interim extubation removal of NG tube. 2. Severe cardiomegaly again noted. Diffuse bilateral interstitial prominence again noted.  Interstitial edema and/or pneumonitis could present in this fashion. Persistent bibasilar atelectasis. Similar findings noted on prior exam. Electronically Signed   By: Crumpler   On: 05/29/2020 12:42   DG CHEST PORT 1 VIEW  Result Date: 05/28/2020 CLINICAL DATA:  Orogastric tube placement. EXAM: PORTABLE CHEST 1 VIEW COMPARISON:  Chest radiographs o from the same day FINDINGS: ET tube approximately 4.8 cm above the carina. Orogastric tube courses below the diaphragm with the tip outside the field of view. The side port is below the GE junction. Similar enlargement of the cardiac silhouette. Similar small bilateral pleural effusions. No discernible pneumothorax. Similar vascular congestion. IMPRESSION: 1. Orogastric tube courses below the diaphragm with the tip outside the field of view. 2. Similar pulmonary vascular congestion and possible small bilateral pleural effusions. Electronically Signed   By: Margaretha Sheffield MD   On: 05/28/2020 20:14   DG CHEST PORT 1 VIEW  Result Date: 05/28/2020 CLINICAL DATA:  Acute respiratory failure and OG tube placement EXAM: PORTABLE CHEST 1 VIEW COMPARISON:  Radiograph 05/25/2020 FINDINGS: Tip of the transesophageal tube terminates in the left upper quadrant with the side port closely approximating the expected location of the GE junction. Could consider advancing 3-5 cm for optimal positioning. Endotracheal tube tip terminates in the mid trachea, 3.2 cm from the carina. Telemetry leads overlie the chest. Surgical clip projects over the right heart border. Bilateral pleural effusions, right greater than left with adjacent  opacities, likely passive atelectasis. Some diffuse hazy interstitial opacity is central pulmonary vascular congestion is noted as well. Cardiomediastinal contours are similar to prior counting for differences in technique. The aorta is calcified. The remaining cardiomediastinal contours are unremarkable. IMPRESSION: Tip of the transesophageal tube terminates in the left upper quadrant with the side port closely approximating the expected location of the GE junction. Could consider advancing 3-5 cm for optimal positioning. Stable satisfactory positioning of the endotracheal tube. Features suggesting CHF/volume overload with cardiomegaly, vascular congestion, bilateral effusions and interstitial edema. Opacities adjacent the pleural effusions likely passive atelectatic change though underlying consolidation is not excluded. Electronically Signed   By: Lovena Le M.D.   On: 05/28/2020 00:44   Scheduled Meds: . acetaminophen (TYLENOL) oral liquid 160 mg/5 mL  325 mg Per Tube Q6H  . chlorhexidine gluconate (MEDLINE KIT)  15 mL Mouth Rinse BID  . Chlorhexidine Gluconate Cloth  6 each Topical Daily  . docusate  100 mg Per Tube BID  . furosemide  40 mg Intravenous Once  . heparin  5,000 Units Subcutaneous Q8H  . mouth rinse  15 mL Mouth Rinse 10 times per day  . methylPREDNISolone (SOLU-MEDROL) injection  40 mg Intravenous Q24H  . multivitamin with minerals  1 tablet Per Tube Daily  . polyethylene glycol  17 g Oral Daily  . sodium chloride flush  3 mL Intravenous Q12H  . sodium chloride flush  3 mL Intravenous Q12H   Continuous Infusions: . sodium chloride    . doxycycline (VIBRAMYCIN) IV Stopped (05/29/20 0346)  . famotidine (PEPCID) IV 20 mg (05/29/20 1300)     LOS: 4 days   Critical Care Procedure Note Authorized and Performed by: Murvin Natal MD  Total Critical Care time:  32 mins  Due to a high probability of clinically significant, life threatening deterioration, the patient required my  highest level of preparedness to intervene emergently and I personally spent this critical care time directly and personally managing the patient.  This critical care time included obtaining a history;  examining the patient, pulse oximetry; ordering and review of studies; arranging urgent treatment with development of a management plan; evaluation of patient's response of treatment; frequent reassessment; and discussions with other providers.  This critical care time was performed to assess and manage the high probability of imminent and life threatening deterioration that could result in multi-organ failure.  It was exclusive of separately billable procedures and treating other patients and teaching time.   Irwin Brakeman, MD How to contact the [facility] Attending or Consulting provider [place] or covering provider during after hours [place], for this patient?  1. Check the care team in [facility] and look for a) attending/consulting TRH provider listed and b) the [facility] team listed 2. Log into [email or web address] and use [facility]'s universal password to access. If you do not have the password, please contact the hospital operator. 3. Locate the [facility] provider you are looking for under [facility] and page to a number that you can be directly reached. 4. If you still have difficulty reaching the provider, please page the [facility] (Director on Call) for the Hospitalists listed on amion for assistance.  [facility]  If 7PM-7AM, please contact night-coverage [email or web address] 05/29/2020, 2:39 PM

## 2020-05-29 NOTE — Evaluation (Signed)
Physical Therapy Evaluation Patient Details Name: Steven Howard MRN: [id number] DOB: [birth year] Today's Date: 05/29/2020   History of Present Illness  69 year old smoker with known COPD, BIBEMS for altered mental status and after a fall at home, found to have ABG 7.15/103/140 on 100% FiO2 after 2 hours of BiPAP and required mechanical ventilation.  BNP was 455, creatinine 1.5, sodium 128, chest x-ray showed bilateral interstitial prominence consistent with CHF and trace bilateral effusions.He was admitted to ICU, sedated, diuresed.  He was also treated with IV steroids bronchodilators and doxycycline for an acute COPD exacerbation.  PCCM consulted to assist with ICU management    Clinical Impression  Pt admitted with above diagnosis. Patient unsteady on his feet and limited to short distance ambulation at this time. Patient requires assistance for bed mobility and transfers at this time as well. Patient reports he does not have anyone who can assist or check on him at home with any consistency. Patient is at increased risk for falls at this time due to deconditioning and decreased mobility. Patient was independent prior to admission and continued to drive. Pt currently with functional limitations due to the deficits listed below (see PT Problem List). Pt will benefit from skilled PT to increase their independence and safety with mobility to allow discharge to the venue listed below.       Follow Up Recommendations SNF;Supervision for mobility/OOB    Equipment Recommendations       Recommendations for Other Services       Precautions / Restrictions Precautions Precautions: Fall Restrictions Weight Bearing Restrictions: No      Mobility  Bed Mobility Overal bed mobility: Needs Assistance Bed Mobility: Supine to Sit     Supine to sit: Min assist;HOB elevated        Transfers Overall transfer level: Needs assistance Equipment used: Rolling walker (2 wheeled) Transfers: Sit  to/from [facility] Sit to Stand: Min assist Stand pivot transfers: Min assist       General transfer comment: slow, labored transfers; moderate use of upper extremities.  Ambulation/Gait Ambulation/Gait assistance: Min assist Gait Distance (Feet): 4 Feet Assistive device: Rolling walker (2 wheeled) Gait Pattern/deviations: Step-to pattern;Decreased step length - right;Decreased step length - left;Decreased stride length;Trunk flexed;Wide base of support;Shuffle Gait velocity: decreased   General Gait Details: slow, labored gait with short apprehensive steps; on RA; limited by fatigue  Stairs            Wheelchair Mobility    Modified Rankin (Stroke Patients Only)       Balance Overall balance assessment: Needs assistance Sitting-balance support: Bilateral upper extremity supported;Feet supported Sitting balance-Leahy Scale: Fair     Standing balance support: Bilateral upper extremity supported;During functional activity Standing balance-Leahy Scale: Fair Standing balance comment: fair with RW        Pertinent Vitals/Pain Pain Assessment: 0-10 Pain Score: 5  Pain Location: buttock from sitting    Home Living Family/patient expects to be discharged to:: Private residence Living Arrangements: Alone   Type of Home: House Home Access: Stairs to enter Entrance Stairs-Rails: Right Entrance Stairs-Number of Steps: 7 Home Layout: One level Home Equipment: Crutches;Cane - single point;Grab bars - toilet;Grab bars - tub/shower      Prior Function Level of Independence: Independent         Comments: prior to admission was still driving     Hand Dominance        Extremity/Trunk Assessment   Upper Extremity Assessment Upper Extremity Assessment: Generalized weakness  Lower Extremity Assessment Lower Extremity Assessment: Generalized weakness    Cervical / Trunk Assessment Cervical / Trunk Assessment: Normal  Communication    Communication: No difficulties  Cognition Arousal/Alertness: Awake/alert Behavior During Therapy: WFL for tasks assessed/performed Overall Cognitive Status: Within Functional Limits for tasks assessed        General Comments      Exercises     Assessment/Plan    PT Assessment Patient needs continued PT services  PT Problem List Decreased strength;Decreased mobility;Decreased activity tolerance;Decreased balance       PT Treatment Interventions DME instruction;Therapeutic exercise;Gait training;Balance training;Functional mobility training;Therapeutic activities;Patient/family education    PT Goals (Current goals can be found in the Care Plan section)  Acute Rehab PT Goals Patient Stated Goal: Go home. PT Goal Formulation: With patient Time For Goal Achievement: 06/12/20 Potential to Achieve Goals: Fair    Frequency Min 3X/week   Barriers to discharge           AM-PAC PT "6 Clicks" Mobility  Outcome Measure Help needed turning from your back to your side while in a flat bed without using bedrails?: A Little Help needed moving from lying on your back to sitting on the side of a flat bed without using bedrails?: A Lot Help needed moving to and from a bed to a chair (including a wheelchair)?: A Little Help needed standing up from a chair using your arms (e.g., wheelchair or bedside chair)?: A Little Help needed to walk in hospital room?: A Little Help needed climbing 3-5 steps with a railing? : A Lot 6 Click Score: 16    End of Session Equipment Utilized During Treatment: Gait belt Activity Tolerance: Patient tolerated treatment well;Patient limited by fatigue Patient left: in chair;with call bell/phone within reach;with nursing/sitter in room Nurse Communication: Mobility status PT Visit Diagnosis: Unsteadiness on feet (R26.81);Other abnormalities of gait and mobility (R26.89);Muscle weakness (generalized) (M62.81);Difficulty in walking, not elsewhere classified  (R26.2)    Time: 1345-1415 PT Time Calculation (min) (ACUTE ONLY): 30 min   Charges:   PT Evaluation $PT Eval Moderate Complexity: 1 Mod PT Treatments $Therapeutic Activity: 8-22 mins        Floria Raveling. Hartnett-Rands, MS, PT Per [facility] #[phone] 05/29/2020, 3:33 PM

## 2020-05-29 NOTE — Progress Notes (Signed)
Nutrition Follow-up  DOCUMENTATION CODES:   Obesity unspecified  INTERVENTION:  Ensure Enlive po BID, each supplement provides 350 kcal and 20 grams of protein  Continue MVI with minerals daily  NUTRITION DIAGNOSIS:   Inadequate oral intake related to inability to eat as evidenced by NPO status. -progressing, diet advanced to soft  GOAL:   Provide needs based on ASPEN/SCCM guidelines -met  MONITOR:   Weight trends, Labs, I & O's, PO intake, Vent status, TF tolerance, Diet advancement  REASON FOR ASSESSMENT:   Consult Enteral/tube feeding initiation and management  ASSESSMENT:  RD working remotely.  69 year old male with history of alcohol abuse, COPD/emphysema, OSA noncompliant with CPAP, recurrent orthostatic hypotension with history of falls associated with AV malformation of right temporal lobe presented due to altered mentation after unwitnessed fall with persistent hypercapnia   Patient admitted with acute on chronic hypercarbic respiratory failure due to CHF/COPD exacerbation.  9/26 admit, intubated 9/28 TF initiated  9/30 extubated  Improved pulmonary vascular congestion on 9/29 CXR, and extubated this morning. New estimated needs calculated to reflect change in status. Diet advanced to soft, will order Ensure to aid with meeting needs.  No documented BMs this admission, on bowel regimen.  Admit wt 118.1 kg      Current wt 116.7 kg Net -5 L since admit +2 BUE; BLE edema noted   Medications reviewed and include: Colace, Methylprednisolone, MVI, Miralax, IV Doxycycline, IV Pepcid  Labs: CBGs 162,446,95  Diet Order:   Diet Order            DIET SOFT Room service appropriate? Yes; Fluid consistency: Thin  Diet effective now                 EDUCATION NEEDS:   No education needs have been identified at this time  Skin:  Skin Assessment: Skin Integrity Issues: Skin Integrity Issues:: Other (Comment) Other: MASD; perineal  Last BM:  pta  Height:    Ht Readings from Last 1 Encounters:  05/25/20 6' (1.829 m)    Weight:   Wt Readings from Last 1 Encounters:  05/29/20 116.7 kg    Ideal Body Weight:  80.9 kg  BMI:  Body mass index is 34.89 kg/m.  Estimated Nutritional Needs: New  Kcal:  2162-2350  Protein:  110-118  Fluid:  >/= 2.1 L/day   Lajuan Lines, RD, LDN Clinical Nutrition After Hours/Weekend Pager # in [place]

## 2020-05-29 NOTE — Procedures (Signed)
Extubation Procedure Note  Patient Details:   Name: Steven Howard DOB: 1951-08-18 MRN: [id number]   Airway Documentation:  Airway 7.5 mm (Active)  Secured at (cm) 26 cm 05/29/20 0800  Measured From Lips 05/29/20 0800  Secured Location Right 05/29/20 0800  Secured By [employer] 05/29/20 0800  Tube Holder Repositioned Yes 05/29/20 0800  Cuff Pressure (cm H2O) 22 cm H2O 05/29/20 0800  Site Condition Dry 05/29/20 0800   Vent end date: (not recorded) Vent end time: (not recorded)   Evaluation  O2 VVKP:22-44% Complications: no complications noted Patient did tolerate procedure well. Bilateral Breath Sounds: Rhonchi   Pt is able to vocalize. He still sounds congested. He is coughing. CPT started with bed.   Lucianne Muss 05/29/2020, 9:08 AM

## 2020-05-29 NOTE — Plan of Care (Signed)
  Problem: Acute Rehab PT Goals(only PT should resolve) Goal: Pt Will Go Supine/Side To Sit Outcome: Progressing Flowsheets (Taken 05/29/2020 1537) Pt will go Supine/Side to Sit: with minimal assist Goal: Pt Will Go Sit To Supine/Side Outcome: Progressing Flowsheets (Taken 05/29/2020 1537) Pt will go Sit to Supine/Side: with minimal assist Goal: Patient Will Transfer Sit To/From Stand Outcome: Progressing Flowsheets (Taken 05/29/2020 1537) Patient will transfer sit to/from stand: with minimal assist Goal: Pt Will Transfer Bed To Chair/Chair To Bed Outcome: Progressing Flowsheets (Taken 05/29/2020 1537) Pt will Transfer Bed to Chair/Chair to Bed: min guard assist Goal: Pt Will Ambulate Outcome: Progressing Flowsheets (Taken 05/29/2020 1537) Pt will Ambulate:  15 feet  with least restrictive assistive device  with minimal assist   Pamala Hurry D. Hartnett-Rands, MS, PT Per [place] [phone] 05/29/2020

## 2020-05-30 DIAGNOSIS — F1010 Alcohol abuse, uncomplicated: Secondary | ICD-10-CM | POA: Diagnosis not present

## 2020-05-30 DIAGNOSIS — J9601 Acute respiratory failure with hypoxia: Secondary | ICD-10-CM | POA: Diagnosis not present

## 2020-05-30 DIAGNOSIS — I5033 Acute on chronic diastolic (congestive) heart failure: Secondary | ICD-10-CM | POA: Diagnosis not present

## 2020-05-30 DIAGNOSIS — J441 Chronic obstructive pulmonary disease with (acute) exacerbation: Secondary | ICD-10-CM | POA: Diagnosis not present

## 2020-05-30 LAB — CBC
HCT: 52.6 % — ABNORMAL HIGH (ref 39.0–52.0)
Hemoglobin: 16.5 g/dL (ref 13.0–17.0)
MCH: 31.2 pg (ref 26.0–34.0)
MCHC: 31.4 g/dL (ref 30.0–36.0)
MCV: 99.4 fL (ref 80.0–100.0)
Platelets: 187 10*3/uL (ref 150–400)
RBC: 5.29 MIL/uL (ref 4.22–5.81)
RDW: 16.3 % — ABNORMAL HIGH (ref 11.5–15.5)
WBC: 7.7 10*3/uL (ref 4.0–10.5)
nRBC: 0 % (ref 0.0–0.2)

## 2020-05-30 LAB — MAGNESIUM: Magnesium: 1.8 mg/dL (ref 1.7–2.4)

## 2020-05-30 LAB — CULTURE, BLOOD (ROUTINE X 2)
Culture: NO GROWTH
Culture: NO GROWTH
Special Requests: ADEQUATE

## 2020-05-30 LAB — BASIC METABOLIC PANEL
Anion gap: 8 (ref 5–15)
BUN: 28 mg/dL — ABNORMAL HIGH (ref 8–23)
CO2: 35 mmol/L — ABNORMAL HIGH (ref 22–32)
Calcium: 8.8 mg/dL — ABNORMAL LOW (ref 8.9–10.3)
Chloride: 98 mmol/L (ref 98–111)
Creatinine, Ser: 0.58 mg/dL — ABNORMAL LOW (ref 0.61–1.24)
GFR calc Af Amer: >60 mL/min (ref >60)
GFR calc non Af Amer: >60 mL/min (ref >60)
Glucose, Bld: 74 mg/dL (ref 70–99)
Potassium: 3.6 mmol/L (ref 3.5–5.1)
Sodium: 141 mmol/L (ref 135–145)

## 2020-05-30 MED ORDER — FUROSEMIDE 10 MG/ML IJ SOLN
40.0000 mg | Freq: Once | INTRAMUSCULAR | Status: AC
  Administered 2020-05-30: 40 mg via INTRAVENOUS
  Filled 2020-05-30: qty 4

## 2020-05-30 MED ORDER — PREDNISONE 20 MG PO TABS
40.0000 mg | ORAL_TABLET | Freq: Every day | ORAL | Status: DC
  Administered 2020-05-31 – 2020-06-07 (×8): 40 mg via ORAL
  Filled 2020-05-30 (×10): qty 2

## 2020-05-30 MED ORDER — DOXYCYCLINE HYCLATE 100 MG PO TABS
100.0000 mg | ORAL_TABLET | Freq: Two times a day (BID) | ORAL | Status: DC
  Administered 2020-05-30 – 2020-06-02 (×7): 100 mg via ORAL
  Filled 2020-05-30 (×8): qty 1

## 2020-05-30 NOTE — NC FL2 (Signed)
[place] LEVEL OF CARE SCREENING TOOL     IDENTIFICATION  Patient Name: Steven Howard Birthdate: 1951/03/24 Sex: male Admission Date (Current Location): 05/25/2020  [facility] and [place] Number:  [employer] and Address:  [place] [address], [place]      Provider Number: [id number]  Attending Physician Name and Address:  Murlean Iba, MD  Relative Name and Phone Number:  Tawny Hopping - wife [phone]    Current Level of Care: Hospital Recommended Level of Care: [place] Prior Approval Number:    Date Approved/Denied:   PASRR Number: pending  Discharge Plan: SNF    Current Diagnoses: Patient Active Problem List   Diagnosis Date Noted   Respiratory failure with hypoxia ([place]) 05/25/2020   Nicotine abuse 05/25/2020   COPD (chronic obstructive pulmonary disease)/Emphysema 05/25/2020   Acute respiratory failure with hypoxia and hypercapnia ([place]) 05/25/2020   Acute exacerbation of CHF (congestive heart failure) ([place]) 05/25/2020   COPD with acute exacerbation ([place]) 05/25/2020   Orthostatic hypotension 12/18/2018   Hyponatremia 07/11/2018   Sleeps in sitting position due to orthopnea 05/19/2018   OSA and COPD overlap syndrome ([place]) 05/19/2018   Circadian rhythm sleep disorder, shift work type 05/19/2018   Excessive daytime sleepiness 05/19/2018   Other sleep apnea 04/20/2018   Prostate cancer ([place]) 04/18/2018   Frequent UTI 04/18/2018   Pulmonary emphysema ([place]) 04/18/2018   Essential hypertension 04/18/2018   Alcohol abuse 04/18/2018   Mixed hyperlipidemia 04/18/2018   Hematuria 04/18/2018   Recurrent major depressive disorder, in partial remission ([place]) 04/18/2018   Cigarette nicotine dependence without complication 86/38/1771   PVD (peripheral vascular disease) ([place]) 04/18/2018    Orientation RESPIRATION BLADDER Height & Weight     Self, Time, Situation,  Place  O2 (4 L) External catheter Weight: 112 kg Height:  6' (182.9 cm)  BEHAVIORAL SYMPTOMS/MOOD NEUROLOGICAL BOWEL NUTRITION STATUS      Continent Diet (see DC summary)  AMBULATORY STATUS COMMUNICATION OF NEEDS Skin   Extensive Assist Verbally Normal                       Personal Care Assistance Level of Assistance  Bathing, Dressing, Feeding Bathing Assistance: Maximum assistance Feeding assistance: Independent Dressing Assistance: Limited assistance     Functional Limitations Info  Sight, Hearing, Speech Sight Info: Impaired Hearing Info: Adequate Speech Info: Adequate    SPECIAL CARE FACTORS FREQUENCY  PT (By licensed PT)     PT Frequency: 5 times a week              Contractures Contractures Info: Not present    Additional Factors Info  Code Status, Allergies Code Status Info: FULL Allergies Info: NKDA           Current Medications (05/30/2020):  This is the current hospital active medication list Current Facility-Administered Medications  Medication Dose Route Frequency Provider Last Rate Last Admin   0.9 %  sodium chloride infusion  250 mL Intravenous PRN Emokpae, Courage, MD       acetaminophen (TYLENOL) tablet 325 mg  325 mg Oral Q6H Johnson, Clanford L, MD   325 mg at 05/30/20 0540   chlorhexidine gluconate (MEDLINE KIT) (PERIDEX) 0.12 % solution 15 mL  15 mL Mouth Rinse BID Emokpae, Courage, MD   15 mL at 05/29/20 0800   Chlorhexidine Gluconate Cloth 2 % PADS 6 each  6 each Topical Daily Roxan Hockey, MD  6 each at 05/29/20 1000   docusate sodium (COLACE) capsule 100 mg  100 mg Oral BID Johnson, Clanford L, MD   100 mg at 05/30/20 1056   doxycycline (VIBRA-TABS) tablet 100 mg  100 mg Oral Q12H Johnson, Clanford L, MD       famotidine (PEPCID) IVPB 20 mg premix  20 mg Intravenous Q24H Emokpae, Courage, MD 100 mL/hr at 05/30/20 1347 20 mg at 05/30/20 1347   feeding supplement (ENSURE ENLIVE) (ENSURE ENLIVE) liquid 237 mL  237 mL Oral  BID BM Johnson, Clanford L, MD       heparin injection 5,000 Units  5,000 Units Subcutaneous Q8H Emokpae, Courage, MD   5,000 Units at 05/30/20 0540   ipratropium-albuterol (DUONEB) 0.5-2.5 (3) MG/3ML nebulizer solution 3 mL  3 mL Nebulization Q4H PRN Roxan Hockey, MD   3 mL at 05/28/20 2027   multivitamin with minerals tablet 1 tablet  1 tablet Per Tube Daily Manuella Ghazi, Pratik D, DO   1 tablet at 05/30/20 1056   nicotine (NICODERM CQ - dosed in mg/24 hours) patch 21 mg  21 mg Transdermal Daily PRN Johnson, Clanford L, MD       ondansetron (ZOFRAN) tablet 4 mg  4 mg Oral Q6H PRN Emokpae, Courage, MD       Or   ondansetron (ZOFRAN) injection 4 mg  4 mg Intravenous Q6H PRN Emokpae, Courage, MD       polyethylene glycol (MIRALAX / GLYCOLAX) packet 17 g  17 g Oral Daily Emokpae, Courage, MD   17 g at 05/28/20 0852   [START ON 05/31/2020] predniSONE (DELTASONE) tablet 40 mg  40 mg Oral QAC breakfast Johnson, Clanford L, MD       sodium chloride flush (NS) 0.9 % injection 3 mL  3 mL Intravenous Q12H Emokpae, Courage, MD   3 mL at 05/30/20 1056   sodium chloride flush (NS) 0.9 % injection 3 mL  3 mL Intravenous PRN Emokpae, Courage, MD       sodium chloride flush (NS) 0.9 % injection 3 mL  3 mL Intravenous Q12H Emokpae, Courage, MD   3 mL at 05/30/20 1056     Discharge Medications: Please see discharge summary for a list of discharge medications.  Relevant Imaging Results:  Relevant Lab Results:   Additional Information SS # [id number]  Boneta Lucks, RN

## 2020-05-30 NOTE — TOC Initial Note (Signed)
Transition of Care [facility]) - Initial/Assessment Note    Patient Details  Name: Steven Howard MRN: [id number] Date of Birth: October 09, 1950  [facility] CM/SW Contact:    Boneta Lucks, RN Phone Number: 05/30/2020, 3:39 PM  Clinical Narrative:  Patient admitted with acute Respiratory failure. Patient lives at home alone, PT is recommending SNF.  TOC spoke with patient he wanted to do what his sister Charleston Ropes suggested. Patient, son - Quita Skye and Charleston Ropes is agreeable to SNF. Choices [facility], [facility], [facility]. Referral sent out. PASSR is pending, uploading documents request by [place] MUST. TOC to follow.          Expected Discharge Plan: Skilled Nursing Facility Barriers to Discharge: Continued Medical Work up   Patient Goals and CMS Choice Patient states their goals for this hospitalization and ongoing recovery are:: to go to SNF CMS Medicare.gov Compare Post Acute Care list provided to:: Patient Choice offered to / list presented to : Patient, Sibling (Sister - [place])  Expected Discharge Plan and Services Expected Discharge Plan: [place]       Living arrangements for the past 2 months: Single Family Home                     Prior Living Arrangements/Services Living arrangements for the past 2 months: Single Family Home Lives with:: Self          Need for Family Participation in Patient Care: Yes (Comment) Care giver support system in place?: Yes (comment)   Criminal Activity/Legal Involvement Pertinent to Current Situation/Hospitalization: No - Comment as needed  Activities of Daily Living Home Assistive Devices/Equipment: None ADL Screening (condition at time of admission) Patient's cognitive ability adequate to safely complete daily activities?: No Is the patient deaf or have difficulty hearing?: No Does the patient have difficulty seeing, even when wearing glasses/contacts?: No Does the patient have difficulty concentrating, remembering, or making  decisions?: No Patient able to express need for assistance with ADLs?: No Does the patient have difficulty dressing or bathing?: No Independently performs ADLs?: No Communication: Dependent (due to being intubated) Is this a change from baseline?: Change from baseline, expected to last >3 days Dressing (OT): Dependent Is this a change from baseline?: Change from baseline, expected to last >3 days Grooming: Dependent Is this a change from baseline?: Change from baseline, expected to last >3 days Feeding: Dependent Is this a change from baseline?: Change from baseline, expected to last >3 days Toileting: Dependent Is this a change from baseline?: Change from baseline, expected to last >3days In/Out Bed: Dependent Is this a change from baseline?: Change from baseline, expected to last >3 days Walks in Home: Dependent Is this a change from baseline?: Change from baseline, expected to last >3 days Does the patient have difficulty walking or climbing stairs?: Yes Weakness of Legs: Both Weakness of Arms/Hands: Both  Permission Sought/Granted      Share Information with NAME: Charleston Ropes     Permission granted to share info w Relationship: sister     Emotional Assessment       Orientation: : Oriented to Self, Oriented to Place, Oriented to  Time, Oriented to Situation Alcohol / Substance Use: Alcohol Use Psych Involvement: No (comment)  Admission diagnosis:  Hyponatremia [E87.1] COPD exacerbation ([place]) [J44.1] Respiratory failure with hypoxia ([place]) [J96.91] Acute respiratory failure with hypoxia and hypercapnia ([place]) [J96.01, J96.02] Acute on chronic congestive heart failure, unspecified heart failure type [facility]) [I50.9] Patient Active Problem List   Diagnosis  Date Noted  . Respiratory failure with hypoxia ([place]) 05/25/2020  . Nicotine abuse 05/25/2020  . COPD (chronic obstructive pulmonary disease)/Emphysema 05/25/2020  . Acute respiratory failure with hypoxia and hypercapnia ([place])  05/25/2020  . Acute exacerbation of CHF (congestive heart failure) ([place]) 05/25/2020  . COPD with acute exacerbation ([place]) 05/25/2020  . Orthostatic hypotension 12/18/2018  . Hyponatremia 07/11/2018  . Sleeps in sitting position due to orthopnea 05/19/2018  . OSA and COPD overlap syndrome ([place]) 05/19/2018  . 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 Excessive daytime sleepiness 05/19/2018  . Other sleep apnea 04/20/2018  . Prostate cancer ([place]) 04/18/2018  . Frequent UTI 04/18/2018  . Pulmonary emphysema ([place]) 04/18/2018  . Essential hypertension 04/18/2018  . Alcohol abuse 04/18/2018  . Mixed hyperlipidemia 04/18/2018  . Hematuria 04/18/2018  . Recurrent major depressive disorder, in partial remission ([place]) 04/18/2018  . Cigarette nicotine dependence without complication 57/84/6962  . PVD (peripheral vascular disease) ([place]) 04/18/2018   PCP:  Claretta Fraise, MD Pharmacy:   [facility], [place] [address] Phone: [phone] Fax: [phone]

## 2020-05-30 NOTE — Progress Notes (Signed)
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 Pt. Relates that he was told that it could be surgically deflated,which patient declined due to concerns about some risk of stroke associated with this procedure---presents today to the ED after falling at home and found to have altered mentation with persistent hypercapnia despite BiPAP use in the ED  9/27: Patient was admitted with combined hypoxemic and hypercapnic respiratory failure secondary to combined CHF and COPD exacerbation for which he is required intubation.  He remains on IV Lasix for diuresis as well as breathing treatments and Solu-Medrol.  PCCM following and weaning ventilator as tolerated.  9/28: Patient continues to have significant diuresis with 1.5 L urine output yesterday.  He remains on FiO2 75%.  PCCM plans for chest x-ray in a.m. and decrease Solu-Medrol to 40 mg every 24 hours today.  2D echocardiogram performed 9/27 with LVEF 55-60% and indeterminate diastolic pressures.  Small pericardial effusion noted.  Continue ongoing management.  9/30: Pt extubated.    10/1: Pt diuresing well.  PT recommending SNF.  Pt agreeable.  Consult to [facility] for placement.   Assessment & Plan:   Principal Problem:   Acute respiratory failure with hypoxia and hypercapnia (HCC) Active Problems:   Essential hypertension   Alcohol abuse   Sleeps in sitting position due to orthopnea   OSA and  COPD overlap syndrome (HCC)   Hyponatremia   Orthostatic hypotension   Nicotine abuse   COPD (chronic obstructive pulmonary disease)/Emphysema   Acute exacerbation of CHF (congestive heart failure) (HCC)   COPD with acute exacerbation (HCC)  Acute hypoxemic and hypercapnic respiratory failure-multifactorial -Related to COPD and CHF exacerbation requiring intubation -Appreciate PCCM assistance with ventilator management -Pt has extubated and breathing better.    Acute CHF exacerbation -Systolic versus diastolic with 2D echocardiogram with small pericardial effusion and LVEF 55-60% performed on 9/27 -Creatinine stable, IV Lasix for diuresis  Intake/Output Summary (Last 24 hours) at 05/30/2020 1328 Last data filed at 05/30/2020 0900 Gross per 24 hour  Intake 952.73 ml  Output 400 ml  Net 552.73 ml    Acute COPD exacerbation with OSA overlap -Continue IV steroids and bronchodilators -Appreciate PCCM assistance while on ventilator -Procalcitonin less than 0.10, plan to continue doxycycline for now while intubated   Acute hypercapnic encephalopathy -CT head with no acute findings - treated with mechanical ventilation, now extubated  AKI - resolved -Baseline creatinine 0.7   Hyponatremia/hypochloremia-resolved now after diuresis -Much improved with diuresis, holding for now -Could be related to beer [place]  History of alcohol and tobacco abuse -Patient will need nicotine patch and further counseling  History of orthostatic hypotension with recurrent falls -Cortisol noted to be 4.4 -Can consider mineralocorticoid on dc as needed  History of dural arteriovenous malformation of brain -Patient was recommended image guided diagnostic cerebral arteriogram with intervention by NIR, but refused  DVT prophylaxis:Heparin Code Status: Full code Family Communication: Discussed with son on phone 9/28, 9/30 sister updated. 10/1 son updated telephone  Disposition Plan: SNF  when bed available or home with [facility] if he  prefers Status is: Inpatient  Remains inpatient appropriate because:Altered mental status, IV treatments appropriate due to intensity of illness or inability to take PO and Inpatient level of care appropriate due to severity of illness  Dispo: The patient is from: Home  Anticipated d/c is to: Home  Anticipated d/c date is: 2 days  Patient currently is not medically stable to d/c. Pt was extubated.   Remains on IV lasix for diuresis.  He is slowly improving and anticipate can discharge home versus   Consultants:   PCCM  Procedures:   See below s/p intubation 9/26, extubated 9/30  Antimicrobials:  Anti-infectives (From admission, onward)   Start     Dose/Rate Route Frequency Ordered Stop   05/25/20 1445  doxycycline (VIBRAMYCIN) 100 mg in sodium chloride 0.9 % 250 mL IVPB        100 mg 125 mL/hr over 120 Minutes Intravenous Every 12 hours 05/25/20 1437 05/31/20 0244   05/25/20 1400  doxycycline (VIBRA-TABS) tablet 100 mg  Status:  Discontinued        100 mg Oral Every 12 hours 05/25/20 1345 05/25/20 1437      Subjective: Patient says he has 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    Objective: Vitals:   05/29/20 2000 05/29/20 2346 05/30/20 0500 05/30/20 0502  BP:  115/74  114/69  Pulse:  94  93  Resp:  20  20  Temp:  97.8 F (36.6 C)  98.4 F (36.9 C)  TempSrc:  Oral  Oral  SpO2:  95%  93%  Weight: 112 kg  112 kg   Height: 6' (1.829 m)       Intake/Output Summary (Last 24 hours) at 05/30/2020 1324 Last data filed at 05/30/2020 0900 Gross per 24 hour  Intake 952.73 ml  Output 400 ml  Net 552.73 ml   Filed Weights   05/29/20 0530 05/29/20 2000 05/30/20 0500  Weight: 116.7 kg 112 kg 112 kg   Examination:  General exam: awake, alert, no distress, cooperative.   Respiratory system: good air movement, rare bibasilar crackles, Clear to auscultation.  Respiratory effort normal. Cardiovascular system: normal S1 & S2 heard.  Gastrointestinal system: Abdomen is nondistended, soft and nontender.  Central nervous system: Sedated on ventilator Extremities: trace pretibial edema BLEs, SCDs present Skin: No rashes, lesions or ulcers Psychiatry: Cannot be evaluated given current condition.  Data Reviewed: I have personally reviewed following labs and imaging studies  CBC: Recent Labs  Lab 05/25/20 0830 05/25/20 0830 05/26/20 0701 05/27/20 0457 05/28/20 0408 05/29/20 0610 05/30/20 0557  WBC 7.4   < > 3.1* 8.2 9.6 8.7 7.7  NEUTROABS 4.8  --   --   --   --   --   --   HGB 16.7   < > 16.6 16.9 16.4 17.1* 16.5  HCT 52.7*   < > 48.8 50.3 50.1 53.2* 52.6*  MCV 99.1   < > 92.6 93.0 96.0 99.4 99.4  PLT 238   < > 244 259 215 168 187   < > = values in this interval not displayed.   Basic Metabolic Panel: Recent Labs  Lab 05/26/20 0701 05/27/20 0457 05/28/20 0408 05/29/20 0610 05/30/20 0557  NA 136 136 138 139 141  K 3.3* 3.9 3.8 3.9 3.6  CL 93* 94* 96* 96* 98  CO2 31 31 32 33* 35*  GLUCOSE 129* 131* 97 92 74  BUN 43* 45* 40* 36* 28*  CREATININE 1.06 1.21 0.93  0.73 0.58*  CALCIUM 8.1* 8.2* 8.4* 8.4* 8.8*  MG 1.9 1.9 2.0  --  1.8  PHOS  --   --  3.4  --   --    GFR: Estimated Creatinine Clearance: 109.5 mL/min (A) (by C-G formula based on SCr of 0.58 mg/dL (L)). Liver Function Tests: Recent Labs  Lab 05/25/20 0830  AST 22  ALT 15  ALKPHOS 62  BILITOT 1.3*  PROT 6.5  ALBUMIN 3.4*   No results for input(s): LIPASE, AMYLASE in the last 168 hours. No results for input(s): AMMONIA in the last 168 hours. Coagulation Profile: No results for input(s): INR, PROTIME in the last 168 hours. Cardiac Enzymes: No results for input(s): CKTOTAL, CKMB, CKMBINDEX, TROPONINI in the last 168 hours. BNP (last 3 results) No results for input(s): PROBNP in the last 8760 hours. HbA1C: No results for input(s): HGBA1C in the last 72  hours. CBG: Recent Labs  Lab 05/29/20 0002 05/29/20 0332 05/29/20 0732 05/29/20 1129 05/29/20 1533  GLUCAP 96 72 89 107* 102*   Lipid Profile: Recent Labs    05/28/20 0408 05/29/20 0610  TRIG 70 481*   Thyroid Function Tests: No results for input(s): TSH, T4TOTAL, FREET4, T3FREE, THYROIDAB in the last 72 hours. Anemia Panel: No results for input(s): VITAMINB12, FOLATE, FERRITIN, TIBC, IRON, RETICCTPCT in the last 72 hours. Sepsis Labs: Recent Labs  Lab 05/25/20 0926 05/25/20 1129 05/26/20 0701  PROCALCITON  --   --  <0.10  LATICACIDVEN 1.0 0.9  --     Recent Results (from the past 240 hour(s))  Urine culture     Status: Abnormal   Collection Time: 05/25/20  8:31 AM   Specimen: Urine, Clean Catch  Result Value Ref Range Status   Specimen Description   Final    URINE, CLEAN CATCH Performed at [facility], [address] [place], [place] [address]    Special Requests   Final    NONE Performed at [facility], [address] [place], [place] [address]    Culture MULTIPLE SPECIES PRESENT, SUGGEST RECOLLECTION (A)  Final   Report Status 05/26/2020 FINAL  Final  Respiratory Panel by RT PCR (Flu A&B, Covid) - Nasopharyngeal Swab     Status: None   Collection Time: 05/25/20  8:33 AM   Specimen: Nasopharyngeal Swab  Result Value Ref Range Status   SARS Coronavirus 2 by RT PCR NEGATIVE NEGATIVE Final    Comment: (NOTE) SARS-CoV-2 target nucleic acids are NOT DETECTED.  The SARS-CoV-2 RNA is generally detectable in upper respiratoy specimens during the acute phase of infection. The lowest concentration of SARS-CoV-2 viral copies this assay can detect is 131 copies/mL. A negative result does not preclude SARS-Cov-2 infection and should not be used as the sole basis for treatment or other patient management decisions. A negative result may occur with  improper specimen collection/handling, submission of specimen other than nasopharyngeal swab, presence of viral  mutation(s) within the areas targeted by this assay, and inadequate number of viral copies (<131 copies/mL). A negative result must be combined with clinical observations, patient history, and epidemiological information. The expected result is Negative.  Fact Sheet for Patients:  PinkCheek.be  Fact Sheet for Healthcare Providers:  GravelBags.it  This test is no t yet approved or cleared by the [place] FDA and  has been authorized for detection and/or diagnosis of SARS-CoV-2 by FDA under an Emergency Use Authorization (EUA). This EUA will remain  in effect (meaning this test can be used) for the  duration of the COVID-19 declaration under Section 564(b)(1) of the Act, 21 U.S.C. section 360bbb-3(b)(1), unless the authorization is terminated or revoked sooner.     Influenza A by PCR NEGATIVE NEGATIVE Final   Influenza B by PCR NEGATIVE NEGATIVE Final    Comment: (NOTE) The Xpert Xpress SARS-CoV-2/FLU/RSV assay is intended as an aid in  the diagnosis of influenza from Nasopharyngeal swab specimens and  should not be used as a sole basis for treatment. Nasal washings and  aspirates are unacceptable for Xpert Xpress SARS-CoV-2/FLU/RSV  testing.  Fact Sheet for Patients: PinkCheek.be  Fact Sheet for Healthcare Providers: GravelBags.it  This test is not yet approved or cleared by the [place] FDA and  has been authorized for detection and/or diagnosis of SARS-CoV-2 by  FDA under an Emergency Use Authorization (EUA). This EUA will remain  in effect (meaning this test can be used) for the duration of the  Covid-19 declaration under Section 564(b)(1) of the Act, 21  U.S.C. section 360bbb-3(b)(1), unless the authorization is  terminated or revoked. Performed at [facility], [address] [place], [place] [address]   Culture, blood (routine x 2)     Status:  None   Collection Time: 05/25/20  9:26 AM   Specimen: Right Antecubital; Blood  Result Value Ref Range Status   Specimen Description   Final    RIGHT ANTECUBITAL BOTTLES DRAWN AEROBIC AND ANAEROBIC   Special Requests Blood Culture adequate volume  Final   Culture   Final    NO GROWTH 5 DAYS Performed at [facility], [address], [place], [place] [address]    Report Status 05/30/2020 FINAL  Final  Culture, blood (routine x 2)     Status: None   Collection Time: 05/25/20  9:26 AM   Specimen: BLOOD RIGHT HAND  Result Value Ref Range Status   Specimen Description BLOOD RIGHT HAND BOTTLES DRAWN AEROBIC ONLY  Final   Special Requests   Final    Blood Culture results may not be optimal due to an inadequate volume of blood received in culture bottles   Culture   Final    NO GROWTH 5 DAYS Performed at [facility], [address] [place], [place] [address]    Report Status 05/30/2020 FINAL  Final  MRSA PCR Screening     Status: None   Collection Time: 05/25/20  2:11 PM   Specimen: Nasal Mucosa; Nasopharyngeal  Result Value Ref Range Status   MRSA by PCR NEGATIVE NEGATIVE Final    Comment:        The GeneXpert MRSA Assay (FDA approved for NASAL specimens only), is one component of a comprehensive MRSA colonization surveillance program. It is not intended to diagnose MRSA infection nor to guide or monitor treatment for MRSA infections. Performed at [facility], [address] [place], [place] [address]   Culture, respiratory (non-expectorated)     Status: None   Collection Time: 05/26/20  8:49 AM   Specimen: Tracheal Aspirate; Respiratory  Result Value Ref Range Status   Specimen Description   Final    TRACHEAL ASPIRATE Performed at [facility], [address], [place], [place] [address]    Special Requests   Final    Normal Performed at [facility], [address], [place], [place] [address]    Gram Stain   Final    RARE WBC PRESENT,BOTH PMN AND MONONUCLEAR FEW GRAM  POSITIVE COCCI IN PAIRS RARE GRAM NEGATIVE RODS    Culture   Final  MODERATE Normal respiratory flora-no Staph aureus or Pseudomonas seen Performed at [facility], [place] [address] [place], [place] [address]    Report Status 05/28/2020 FINAL  Final    Radiology Studies: DG CHEST PORT 1 VIEW  Result Date: 05/29/2020 CLINICAL DATA:  CHF exacerbation. EXAM: PORTABLE CHEST 1 VIEW COMPARISON:  05/28/2020. FINDINGS: Interim extubation and removal of NG tube. Cardiomegaly. Diffuse bilateral interstitial prominence again noted. Interstitial edema and/or pneumonitis could present in this fashion. Persistent bibasilar atelectasis. No prominent pleural effusion. No pneumothorax. Degenerative change thoracic spine. IMPRESSION: 1. Interim extubation removal of NG tube. 2. Severe cardiomegaly again noted. Diffuse bilateral interstitial prominence again noted. Interstitial edema and/or pneumonitis could present in this fashion. Persistent bibasilar atelectasis. Similar findings noted on prior exam. Electronically Signed   By: Olmito and Olmito   On: 05/29/2020 12:42   DG CHEST PORT 1 VIEW  Result Date: 05/28/2020 CLINICAL DATA:  Orogastric tube placement. EXAM: PORTABLE CHEST 1 VIEW COMPARISON:  Chest radiographs o from the same day FINDINGS: ET tube approximately 4.8 cm above the carina. Orogastric tube courses below the diaphragm with the tip outside the field of view. The side port is below the GE junction. Similar enlargement of the cardiac silhouette. Similar small bilateral pleural effusions. No discernible pneumothorax. Similar vascular congestion. IMPRESSION: 1. Orogastric tube courses below the diaphragm with the tip outside the field of view. 2. Similar pulmonary vascular congestion and possible small bilateral pleural effusions. Electronically Signed   By: Margaretha Sheffield MD   On: 05/28/2020 20:14   Scheduled Meds:  acetaminophen  325 mg Oral Q6H   chlorhexidine gluconate (MEDLINE KIT)  15  mL Mouth Rinse BID   Chlorhexidine Gluconate Cloth  6 each Topical Daily   docusate sodium  100 mg Oral BID   feeding supplement (ENSURE ENLIVE)  237 mL Oral BID BM   heparin  5,000 Units Subcutaneous Q8H   multivitamin with minerals  1 tablet Per Tube Daily   polyethylene glycol  17 g Oral Daily   [START ON 05/31/2020] predniSONE  40 mg Oral QAC breakfast   sodium chloride flush  3 mL Intravenous Q12H   sodium chloride flush  3 mL Intravenous Q12H   Continuous Infusions:  sodium chloride     doxycycline (VIBRAMYCIN) IV 100 mg (05/30/20 0306)   famotidine (PEPCID) IV 20 mg (05/29/20 1300)    LOS: 5 days   Irwin Brakeman, MD How to contact the [facility] Attending or Consulting provider [place] or covering provider during after hours [place], for this patient?  1. Check the care team in [facility] and look for a) attending/consulting TRH provider listed and b) the [facility] team listed 2. Log into [email or web address] and use 's universal password to access. If you do not have the password, please contact the hospital operator. 3. Locate the [facility] provider you are looking for under [facility] and page to a number that you can be directly reached. 4. If you still have difficulty reaching the provider, please page the [facility] (Director on Call) for the Hospitalists listed on amion for assistance.  [facility]  If 7PM-7AM, please contact night-coverage [email or web address] 05/30/2020, 1:24 PM

## 2020-05-30 NOTE — Progress Notes (Signed)
Physical Therapy Treatment Patient Details Name: Steven Howard MRN: [id number] DOB: August 17, 1951 Today's Date: 05/30/2020    History of Present Illness 69 year old smoker with known COPD, BIBEMS for altered mental status and after a fall at home, found to have ABG 7.15/103/140 on 100% FiO2 after 2 hours of BiPAP and required mechanical ventilation.  BNP was 455, creatinine 1.5, sodium 128, chest x-ray showed bilateral interstitial prominence consistent with CHF and trace bilateral effusions.He was admitted to ICU, sedated, diuresed.  He was also treated with IV steroids bronchodilators and doxycycline for an acute COPD exacerbation.  PCCM consulted to assist with ICU management    PT Comments    Patient demonstrates increased endurance/distance for gait training without loss of balance, slightly unsteady and limited mostly due to fatigue, on 2 LPM O2 with SpO2 at 90% after sitting in chair that increased to 93-94%.  Patient had difficulty with sit to stands requiring verbal/tactile cueing for proper hand placement with fair/good carryover demonstrated.  Patient tolerated sitting up in chair after therapy - nursing staff notified.  Patient will benefit from continued physical therapy in hospital and recommended venue below to increase strength, balance, endurance for safe ADLs and gait.    Follow Up Recommendations  SNF;Supervision for mobility/OOB     Equipment Recommendations       Recommendations for Other Services       Precautions / Restrictions Precautions Precautions: Fall Restrictions Weight Bearing Restrictions: No    Mobility  Bed Mobility Overal bed mobility: Needs Assistance Bed Mobility: Sit to Supine     Supine to sit: Min assist;HOB elevated     General bed mobility comments: slow labored movement  Transfers Overall transfer level: Needs assistance Equipment used: Rolling walker (2 wheeled) Transfers: Sit to/from [facility] Sit to Stand: Min  assist Stand pivot transfers: Min assist       General transfer comment: increased time, labored movement  Ambulation/Gait Ambulation/Gait assistance: Min assist Gait Distance (Feet): 22 Feet Assistive device: Rolling walker (2 wheeled) Gait Pattern/deviations: Step-to pattern;Decreased step length - right;Decreased step length - left;Decreased stride length;Wide base of support Gait velocity: decreased   General Gait Details: slow labored cadence with wide base of support, no loss of balance, limited secondary to fatigue   Stairs             Wheelchair Mobility    Modified Rankin (Stroke Patients Only)       Balance Overall balance assessment: Needs assistance Sitting-balance support: Bilateral upper extremity supported;Feet supported Sitting balance-Leahy Scale: Fair     Standing balance support: Bilateral upper extremity supported;During functional activity Standing balance-Leahy Scale: Fair Standing balance comment: fair with RW                            Cognition Arousal/Alertness: Awake/alert Behavior During Therapy: WFL for tasks assessed/performed Overall Cognitive Status: Within Functional Limits for tasks assessed                                        Exercises      General Comments        Pertinent Vitals/Pain Pain Assessment: Faces Faces Pain Scale: Hurts a little bit Pain Location: discomfort in chest from coughing mucous Pain Descriptors / Indicators: Sore Pain Intervention(s): Limited activity within patient's tolerance;Monitored during session    Home Living  Prior Function            PT Goals (current goals can now be found in the care plan section) Acute Rehab PT Goals Patient Stated Goal: Go home. PT Goal Formulation: With patient Time For Goal Achievement: 06/12/20 Potential to Achieve Goals: Good Progress towards PT goals: Progressing toward goals     Frequency    Min 3X/week      PT Plan Current plan remains appropriate    Co-evaluation              AM-PAC PT "6 Clicks" Mobility   Outcome Measure  Help needed turning from your back to your side while in a flat bed without using bedrails?: A Little Help needed moving from lying on your back to sitting on the side of a flat bed without using bedrails?: A Little Help needed moving to and from a bed to a chair (including a wheelchair)?: A Little Help needed standing up from a chair using your arms (e.g., wheelchair or bedside chair)?: A Little Help needed to walk in hospital room?: A Lot Help needed climbing 3-5 steps with a railing? : A Lot 6 Click Score: 16    End of Session   Activity Tolerance: Patient tolerated treatment well;Patient limited by fatigue Patient left: in chair;with call bell/phone within reach;with nursing/sitter in room Nurse Communication: Mobility status PT Visit Diagnosis: Unsteadiness on feet (R26.81);Other abnormalities of gait and mobility (R26.89);Muscle weakness (generalized) (M62.81);Difficulty in walking, not elsewhere classified (R26.2)     Time: 2202-5427 PT Time Calculation (min) (ACUTE ONLY): 29 min  Charges:  $Gait Training: 8-22 mins $Therapeutic Exercise: 8-22 mins                     3:42 PM, 05/30/20 Lonell Grandchild, MPT Physical Therapist with [facility] [phone] office [phone] mobile phone

## 2020-05-30 NOTE — TOC Progression Note (Signed)
Transition of Care [facility]) - Progression Note    Patient Details  Name: Steven Howard MRN: [id number] Date of Birth: [birth year]  Transition of Care [facility]) CM/SW Contact  Boneta Lucks, RN Phone Number: [phone] 05/30/2020, 3:13 PM   MUST ID # [id number]  Please be advised that the above name patient will require a short-term nursing home stay, anticipated 30 days or less rehabilitation and strengthening. The plan is for return home.

## 2020-05-30 NOTE — Progress Notes (Signed)
Patient requesting foley catheter, but bladder scan shows 0 ml in bladder and patient voiding very well. Patient complaining that condom catheter keeps coming off and he wants a foley catheter because it is more convenient. Educated patient that we do not place foley catheters for convenience because of the infection risk associated. Patient's condom catheter has stayed on all day and patient has voided throughout the day. Dr. Wynetta Emery made aware.

## 2020-05-30 NOTE — Care Management Important Message (Signed)
Important Message  Patient Details  Name: Steven Howard MRN: [id number] Date of Birth: 1951-02-02   Medicare Important Message Given:  Yes     Tommy Medal 05/30/2020, 1:24 PM

## 2020-05-31 DIAGNOSIS — J441 Chronic obstructive pulmonary disease with (acute) exacerbation: Secondary | ICD-10-CM | POA: Diagnosis not present

## 2020-05-31 DIAGNOSIS — F1010 Alcohol abuse, uncomplicated: Secondary | ICD-10-CM | POA: Diagnosis not present

## 2020-05-31 DIAGNOSIS — J9601 Acute respiratory failure with hypoxia: Secondary | ICD-10-CM | POA: Diagnosis not present

## 2020-05-31 DIAGNOSIS — I10 Essential (primary) hypertension: Secondary | ICD-10-CM | POA: Diagnosis not present

## 2020-05-31 LAB — BASIC METABOLIC PANEL
Anion gap: 8 (ref 5–15)
BUN: 22 mg/dL (ref 8–23)
CO2: 37 mmol/L — ABNORMAL HIGH (ref 22–32)
Calcium: 8.9 mg/dL (ref 8.9–10.3)
Chloride: 97 mmol/L — ABNORMAL LOW (ref 98–111)
Creatinine, Ser: 0.52 mg/dL — ABNORMAL LOW (ref 0.61–1.24)
GFR calc Af Amer: >60 mL/min (ref >60)
GFR calc non Af Amer: >60 mL/min (ref >60)
Glucose, Bld: 86 mg/dL (ref 70–99)
Potassium: 3.4 mmol/L — ABNORMAL LOW (ref 3.5–5.1)
Sodium: 142 mmol/L (ref 135–145)

## 2020-05-31 LAB — CBC
HCT: 51.2 % (ref 39.0–52.0)
Hemoglobin: 16.1 g/dL (ref 13.0–17.0)
MCH: 31.8 pg (ref 26.0–34.0)
MCHC: 31.4 g/dL (ref 30.0–36.0)
MCV: 101.2 fL — ABNORMAL HIGH (ref 80.0–100.0)
Platelets: 203 10*3/uL (ref 150–400)
RBC: 5.06 MIL/uL (ref 4.22–5.81)
RDW: 16.7 % — ABNORMAL HIGH (ref 11.5–15.5)
WBC: 9.4 10*3/uL (ref 4.0–10.5)
nRBC: 0 % (ref 0.0–0.2)

## 2020-05-31 LAB — MAGNESIUM: Magnesium: 1.8 mg/dL (ref 1.7–2.4)

## 2020-05-31 MED ORDER — MAGNESIUM SULFATE 2 GM/50ML IV SOLN
2.0000 g | Freq: Once | INTRAVENOUS | Status: AC
  Administered 2020-05-31: 2 g via INTRAVENOUS
  Filled 2020-05-31: qty 50

## 2020-05-31 MED ORDER — FUROSEMIDE 40 MG PO TABS
40.0000 mg | ORAL_TABLET | Freq: Every day | ORAL | Status: DC
  Administered 2020-05-31 – 2020-06-03 (×4): 40 mg via ORAL
  Filled 2020-05-31 (×4): qty 1

## 2020-05-31 MED ORDER — POTASSIUM CHLORIDE CRYS ER 20 MEQ PO TBCR
40.0000 meq | EXTENDED_RELEASE_TABLET | Freq: Every day | ORAL | Status: DC
  Administered 2020-05-31 – 2020-06-07 (×8): 40 meq via ORAL
  Filled 2020-05-31 (×9): qty 2

## 2020-05-31 NOTE — Progress Notes (Signed)
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 Pt. Relates that he was told that it could be surgically deflated,which patient declined due to concerns about some risk of stroke associated with this procedure---presents today to the ED after falling at home and found to have altered mentation with persistent hypercapnia despite BiPAP use in the ED  9/27: Patient was admitted with combined hypoxemic and hypercapnic respiratory failure secondary to combined CHF and COPD exacerbation for which he is required intubation.  He remains on IV Lasix for diuresis as well as breathing treatments and Solu-Medrol.  PCCM following and weaning ventilator as tolerated.  9/28: Patient continues to have significant diuresis with 1.5 L urine output yesterday.  He remains on FiO2 75%.  PCCM plans for chest x-ray in a.m. and decrease Solu-Medrol to 40 mg every 24 hours today.  2D echocardiogram performed 9/27 with LVEF 55-60% and indeterminate diastolic pressures.  Small pericardial effusion noted.  Continue ongoing management.  9/30: Pt extubated.    10/1: Pt diuresing well.  PT recommending SNF.  Pt agreeable.  Consult to [facility] for placement.  10/2: medically ready to discharge, awaiting passr    Assessment & Plan:   Principal Problem:   Acute respiratory failure with hypoxia and hypercapnia ([place]) Active Problems:   Essential hypertension   Alcohol abuse    Sleeps in sitting position due to orthopnea   OSA and COPD overlap syndrome (HCC)   Hyponatremia   Orthostatic hypotension   Nicotine abuse   COPD (chronic obstructive pulmonary disease)/Emphysema   Acute exacerbation of CHF (congestive heart failure) (HCC)   COPD with acute exacerbation (HCC)  Acute hypoxemic and hypercapnic respiratory failure-multifactorial -Related to COPD and CHF exacerbation requiring intubation -Appreciate PCCM assistance with ventilator management -Pt has extubated and breathing better.    Acute CHF exacerbation -Systolic versus diastolic with 2D echocardiogram with small pericardial effusion and LVEF 55-60% performed on 9/27 -Creatinine stable, Lasix for diuresis  Intake/Output Summary (Last 24 hours) at 05/31/2020 1218 Last data filed at 05/30/2020 1900 Gross per 24 hour  Intake 480 ml  Output 2500 ml  Net -2020 ml    Acute COPD exacerbation with OSA overlap -Continue IV steroids and bronchodilators -Appreciate PCCM assistance while on ventilator -Procalcitonin less than 0.10   Acute hypercapnic encephalopathy -CT head with no acute findings - treated with mechanical ventilation, now extubated  AKI - resolved -Baseline creatinine 0.7   Hyponatremia/hypochloremia-resolved now after diuresis -Much improved with diuresis, holding for now -Could be related to beer Potomania  History of alcohol and tobacco abuse -Patient will need nicotine patch and further counseling  History of orthostatic hypotension with recurrent falls -Cortisol noted to be 4.4  History of dural arteriovenous malformation of brain -Patient was recommended image guided diagnostic cerebral arteriogram with intervention by NIR, but refused  DVT prophylaxis:Heparin Code Status: Full code Family Communication: Discussed with son on phone 9/28, 9/30 sister updated. 10/1 son updated telephone  Disposition Plan: SNF when bed available or home with [facility] if he prefers Status  is: Inpatient  Remains inpatient  appropriate because:Altered mental status, IV treatments appropriate due to intensity of illness or inability to take PO and Inpatient level of care appropriate due to severity of illness  Dispo: The patient is from: Home  Anticipated d/c is to: Home  Anticipated d/c date is: 2 days  Patient currently is not medically stable to d/c. Pt was extubated.   Remains on IV lasix for diuresis.  He is slowly improving and anticipate can discharge home versus   Consultants:   PCCM  Procedures:   See below s/p intubation 9/26, extubated 9/30  Antimicrobials:  Anti-infectives (From admission, onward)   Start     Dose/Rate Route Frequency Ordered Stop   05/30/20 2000  doxycycline (VIBRA-TABS) tablet 100 mg        100 mg Oral Every 12 hours 05/30/20 1329     05/25/20 1445  doxycycline (VIBRAMYCIN) 100 mg in sodium chloride 0.9 % 250 mL IVPB  Status:  Discontinued        100 mg 125 mL/hr over 120 Minutes Intravenous Every 12 hours 05/25/20 1437 05/30/20 1329   05/25/20 1400  doxycycline (VIBRA-TABS) tablet 100 mg  Status:  Discontinued        100 mg Oral Every 12 hours 05/25/20 1345 05/25/20 1437      Subjective: Patient without complaint.    Objective: Vitals:   05/30/20 1356 05/30/20 2119 05/31/20 0427 05/31/20 0500  BP: 110/65 107/68 (!) 125/92   Pulse: 94 98 (!) 49   Resp: _0 Temp: 97.9 F (36.6 C) 98 F (36.7 C) 99.2 F (37.3 C)   TempSrc: Oral  Oral   SpO2: 93% 90% 96%   Weight:    110.3 kg  Height:        Intake/Output Summary (Last 24 hours) at 05/31/2020 1218 Last data filed at 05/30/2020 1900 Gross per 24 hour  Intake 480 ml  Output 2500 ml  Net -2020 ml   Filed Weights   05/29/20 2000 05/30/20 0500 05/31/20 0500  Weight: 112 kg 112 kg 110.3 kg   Examination:  General exam: awake, alert, no distress, cooperative.   Respiratory system: good air movement, rare bibasilar crackles,  Clear to auscultation. Respiratory effort normal. Cardiovascular system: normal S1 & S2 heard.  Gastrointestinal system: Abdomen is nondistended, soft and nontender.  Central nervous system: Sedated on ventilator Extremities: trace pretibial edema BLEs, SCDs present Skin: No rashes, lesions or ulcers Psychiatry: Cannot be evaluated given current condition.  Data Reviewed: I have personally reviewed following labs and imaging studies  CBC: Recent Labs  Lab 05/25/20 0830 05/26/20 0701 05/27/20 0457 05/28/20 0408 05/29/20 0610 05/30/20 0557 05/31/20 0704  WBC 7.4   < > 8.2 9.6 8.7 7.7 9.4  NEUTROABS 4.8  --   --   --   --   --   --   HGB 16.7   < > 16.9 16.4 17.1* 16.5 16.1  HCT 52.7*   < > 50.3 50.1 53.2* 52.6* 51.2  MCV 99.1   < > 93.0 96.0 99.4 99.4 101.2*  PLT 238   < > 259 215 168 187 203   < > = values in this interval not displayed.   Basic Metabolic Panel: Recent Labs  Lab 05/26/20 0701 05/26/20 0701 05/27/20 0457 05/28/20 0408 05/29/20 0610 05/30/20 0557 05/31/20 0704  NA 136   < > 136 138 139 141 142  K 3.3*   < > 3.9 3.8 3.9 3.6 3.4*  CL 93*   < >  94* 96* 96* 98 97*  CO2 31   < > 31 32 33* 35* 37*  GLUCOSE 129*   < > 131* 97 92 74 86  BUN 43*   < > 45* 40* 36* 28* 22  CREATININE 1.06   < > 1.21 0.93 0.73 0.58* 0.52*  CALCIUM 8.1*   < > 8.2* 8.4* 8.4* 8.8* 8.9  MG 1.9  --  1.9 2.0  --  1.8 1.8  PHOS  --   --   --  3.4  --   --   --    < > = values in this interval not displayed.   GFR: Estimated Creatinine Clearance: 108.7 mL/min (A) (by C-G formula based on SCr of 0.52 mg/dL (L)). Liver Function Tests: Recent Labs  Lab 05/25/20 0830  AST 22  ALT 15  ALKPHOS 62  BILITOT 1.3*  PROT 6.5  ALBUMIN 3.4*   No results for input(s): LIPASE, AMYLASE in the last 168 hours. No results for input(s): AMMONIA in the last 168 hours. Coagulation Profile: No results for input(s): INR, PROTIME in the last 168 hours. Cardiac Enzymes: No results for input(s):  CKTOTAL, CKMB, CKMBINDEX, TROPONINI in the last 168 hours. BNP (last 3 results) No results for input(s): PROBNP in the last 8760 hours. HbA1C: No results for input(s): HGBA1C in the last 72 hours. CBG: Recent Labs  Lab 05/29/20 0002 05/29/20 0332 05/29/20 0732 05/29/20 1129 05/29/20 1533  GLUCAP 96 72 89 107* 102*   Lipid Profile: Recent Labs    05/29/20 0610  TRIG 481*   Thyroid Function Tests: No results for input(s): TSH, T4TOTAL, FREET4, T3FREE, THYROIDAB in the last 72 hours. Anemia Panel: No results for input(s): VITAMINB12, FOLATE, FERRITIN, TIBC, IRON, RETICCTPCT in the last 72 hours. Sepsis Labs: Recent Labs  Lab 05/25/20 0926 05/25/20 1129 05/26/20 0701  PROCALCITON  --   --  <0.10  LATICACIDVEN 1.0 0.9  --     Recent Results (from the past 240 hour(s))  Urine culture     Status: Abnormal   Collection Time: 05/25/20  8:31 AM   Specimen: Urine, Clean Catch  Result Value Ref Range Status   Specimen Description   Final    URINE, CLEAN CATCH Performed at [facility], [address], [place], [place] [address]    Special Requests   Final    NONE Performed at [facility], [address] [place], [place] [address]    Culture MULTIPLE SPECIES PRESENT, SUGGEST RECOLLECTION (A)  Final   Report Status 05/26/2020 FINAL  Final  Respiratory Panel by RT PCR (Flu A&B, Covid) - Nasopharyngeal Swab     Status: None   Collection Time: 05/25/20  8:33 AM   Specimen: Nasopharyngeal Swab  Result Value Ref Range Status   SARS Coronavirus 2 by RT PCR NEGATIVE NEGATIVE Final    Comment: (NOTE) SARS-CoV-2 target nucleic acids are NOT DETECTED.  The SARS-CoV-2 RNA is generally detectable in upper respiratoy specimens during the acute phase of infection. The lowest concentration of SARS-CoV-2 viral copies this assay can detect is 131 copies/mL. A negative result does not preclude SARS-Cov-2 infection and should not be used as the sole basis for treatment or other patient  management decisions. A negative result may occur with  improper specimen collection/handling, submission of specimen other than nasopharyngeal swab, presence of viral mutation(s) within the areas targeted by this assay, and inadequate number of viral copies (<131 copies/mL). A negative result must be combined with clinical observations,  patient history, and epidemiological information. The expected result is Negative.  Fact Sheet for Patients:  PinkCheek.be  Fact Sheet for Healthcare Providers:  GravelBags.it  This test is no t yet approved or cleared by the [place] FDA and  has been authorized for detection and/or diagnosis of SARS-CoV-2 by FDA under an Emergency Use Authorization (EUA). This EUA will remain  in effect (meaning this test can be used) for the duration of the COVID-19 declaration under Section 564(b)(1) of the Act, 21 U.S.C. section 360bbb-3(b)(1), unless the authorization is terminated or revoked sooner.     Influenza A by PCR NEGATIVE NEGATIVE Final   Influenza B by PCR NEGATIVE NEGATIVE Final    Comment: (NOTE) The Xpert Xpress SARS-CoV-2/FLU/RSV assay is intended as an aid in  the diagnosis of influenza from Nasopharyngeal swab specimens and  should not be used as a sole basis for treatment. Nasal washings and  aspirates are unacceptable for Xpert Xpress SARS-CoV-2/FLU/RSV  testing.  Fact Sheet for Patients: PinkCheek.be  Fact Sheet for Healthcare Providers: GravelBags.it  This test is not yet approved or cleared by the [place] FDA and  has been authorized for detection and/or diagnosis of SARS-CoV-2 by  FDA under an Emergency Use Authorization (EUA). This EUA will remain  in effect (meaning this test can be used) for the duration of the  Covid-19 declaration under Section 564(b)(1) of the Act, 21  U.S.C. section 360bbb-3(b)(1),  unless the authorization is  terminated or revoked. Performed at [facility], [address], [place], [place] [address]   Culture, blood (routine x 2)     Status: None   Collection Time: 05/25/20  9:26 AM   Specimen: Right Antecubital; Blood  Result Value Ref Range Status   Specimen Description   Final    RIGHT ANTECUBITAL BOTTLES DRAWN AEROBIC AND ANAEROBIC   Special Requests Blood Culture adequate volume  Final   Culture   Final    NO GROWTH 5 DAYS Performed at [facility], [address] [place], [place] [address]    Report Status 05/30/2020 FINAL  Final  Culture, blood (routine x 2)     Status: None   Collection Time: 05/25/20  9:26 AM   Specimen: BLOOD RIGHT HAND  Result Value Ref Range Status   Specimen Description BLOOD RIGHT HAND BOTTLES DRAWN AEROBIC ONLY  Final   Special Requests   Final    Blood Culture results may not be optimal due to an inadequate volume of blood received in culture bottles   Culture   Final    NO GROWTH 5 DAYS Performed at [facility], [address] [place], [place] [address]    Report Status 05/30/2020 FINAL  Final  MRSA PCR Screening     Status: None   Collection Time: 05/25/20  2:11 PM   Specimen: Nasal Mucosa; Nasopharyngeal  Result Value Ref Range Status   MRSA by PCR NEGATIVE NEGATIVE Final    Comment:        The GeneXpert MRSA Assay (FDA approved for NASAL specimens only), is one component of a comprehensive MRSA colonization surveillance program. It is not intended to diagnose MRSA infection nor to guide or monitor treatment for MRSA infections. Performed at [facility], [address], [place], [place] [address]   Culture, respiratory (non-expectorated)     Status: None   Collection Time: 05/26/20  8:49 AM   Specimen: Tracheal Aspirate; Respiratory  Result Value Ref Range Status   Specimen Description   Final  TRACHEAL ASPIRATE Performed at [facility], [address], [place], [place] [address]    Special Requests    Final    Normal Performed at [facility], [address], [place], [place] [address]    Gram Stain   Final    RARE WBC PRESENT,BOTH PMN AND MONONUCLEAR FEW GRAM POSITIVE COCCI IN PAIRS RARE GRAM NEGATIVE RODS    Culture   Final    MODERATE Normal respiratory flora-no Staph aureus or Pseudomonas seen Performed at [facility], [address], [place],  [address]    Report Status 05/28/2020 FINAL  Final    Radiology Studies: DG CHEST PORT 1 VIEW  Result Date: 05/29/2020 CLINICAL DATA:  CHF exacerbation. EXAM: PORTABLE CHEST 1 VIEW COMPARISON:  05/28/2020. FINDINGS: Interim extubation and removal of NG tube. Cardiomegaly. Diffuse bilateral interstitial prominence again noted. Interstitial edema and/or pneumonitis could present in this fashion. Persistent bibasilar atelectasis. No prominent pleural effusion. No pneumothorax. Degenerative change thoracic spine. IMPRESSION: 1. Interim extubation removal of NG tube. 2. Severe cardiomegaly again noted. Diffuse bilateral interstitial prominence again noted. Interstitial edema and/or pneumonitis could present in this fashion. Persistent bibasilar atelectasis. Similar findings noted on prior exam. Electronically Signed   By: Marcello Moores  Register   On: 05/29/2020 12:42   Scheduled Meds: . acetaminophen  325 mg Oral Q6H  . chlorhexidine gluconate (MEDLINE KIT)  15 mL Mouth Rinse BID  . Chlorhexidine Gluconate Cloth  6 each Topical Daily  . docusate sodium  100 mg Oral BID  . doxycycline  100 mg Oral Q12H  . feeding supplement (ENSURE ENLIVE)  237 mL Oral BID BM  . heparin  5,000 Units Subcutaneous Q8H  . multivitamin with minerals  1 tablet Per Tube Daily  . polyethylene glycol  17 g Oral Daily  . predniSONE  40 mg Oral QAC breakfast  . sodium chloride flush  3 mL Intravenous Q12H  . sodium chloride flush  3 mL Intravenous Q12H   Continuous Infusions: . sodium chloride    . famotidine (PEPCID) IV 20 mg (05/30/20 1347)    LOS: 6 days    Irwin Brakeman, MD How to contact the [facility] Attending or Consulting provider [place] or covering provider during after hours Noonday, for this patient?  1. Check the care team in [facility] and look for a) attending/consulting [facility] provider listed and b) the [facility] team listed 2. Log into [email or web address] and use [facility]'s universal password to access. If you do not have the password, please contact the hospital operator. 3. Locate the [facility] provider you are looking for under [facility] and page to a number that you can be directly reached. 4. If you still have difficulty reaching the provider, please page the [employer] [facility] (Director on Call) for the Hospitalists listed on amion for assistance.  [facility]  If 7PM-7AM, please contact night-coverage [email or web address] 05/31/2020, 12:18 PM

## 2020-06-01 DIAGNOSIS — F1010 Alcohol abuse, uncomplicated: Secondary | ICD-10-CM | POA: Diagnosis not present

## 2020-06-01 DIAGNOSIS — J441 Chronic obstructive pulmonary disease with (acute) exacerbation: Secondary | ICD-10-CM | POA: Diagnosis not present

## 2020-06-01 DIAGNOSIS — I10 Essential (primary) hypertension: Secondary | ICD-10-CM | POA: Diagnosis not present

## 2020-06-01 DIAGNOSIS — J9601 Acute respiratory failure with hypoxia: Secondary | ICD-10-CM | POA: Diagnosis not present

## 2020-06-01 LAB — BASIC METABOLIC PANEL
Anion gap: 8 (ref 5–15)
BUN: 20 mg/dL (ref 8–23)
CO2: 39 mmol/L — ABNORMAL HIGH (ref 22–32)
Calcium: 9 mg/dL (ref 8.9–10.3)
Chloride: 94 mmol/L — ABNORMAL LOW (ref 98–111)
Creatinine, Ser: 0.51 mg/dL — ABNORMAL LOW (ref 0.61–1.24)
GFR calc Af Amer: >60 mL/min (ref >60)
GFR calc non Af Amer: >60 mL/min (ref >60)
Glucose, Bld: 90 mg/dL (ref 70–99)
Potassium: 3.7 mmol/L (ref 3.5–5.1)
Sodium: 141 mmol/L (ref 135–145)

## 2020-06-01 LAB — CBC
HCT: 51.5 % (ref 39.0–52.0)
Hemoglobin: 15.8 g/dL (ref 13.0–17.0)
MCH: 31.1 pg (ref 26.0–34.0)
MCHC: 30.7 g/dL (ref 30.0–36.0)
MCV: 101.4 fL — ABNORMAL HIGH (ref 80.0–100.0)
Platelets: 211 10*3/uL (ref 150–400)
RBC: 5.08 MIL/uL (ref 4.22–5.81)
RDW: 16.3 % — ABNORMAL HIGH (ref 11.5–15.5)
WBC: 6.4 10*3/uL (ref 4.0–10.5)
nRBC: 0 % (ref 0.0–0.2)

## 2020-06-01 LAB — MAGNESIUM: Magnesium: 1.9 mg/dL (ref 1.7–2.4)

## 2020-06-01 MED ORDER — POLYETHYLENE GLYCOL 3350 17 G PO PACK: 17.0000 g | PACK | Freq: Every day | ORAL | Status: DC | PRN

## 2020-06-01 NOTE — Progress Notes (Signed)
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 Pt. Relates that he was told that it could be surgically deflated,which patient declined due to concerns about some risk of stroke associated with this procedure---presents today to the ED after falling at home and found to have altered mentation with persistent hypercapnia despite BiPAP use in the ED  9/27: Patient was admitted with combined hypoxemic and hypercapnic respiratory failure secondary to combined CHF and COPD exacerbation for which he is required intubation.  He remains on IV Lasix for diuresis as well as breathing treatments and Solu-Medrol.  PCCM following and weaning ventilator as tolerated.  9/28: Patient continues to have significant diuresis with 1.5 L urine output yesterday.  He remains on FiO2 75%.  PCCM plans for chest x-ray in a.m. and decrease Solu-Medrol to 40 mg every 24 hours today.  2D echocardiogram performed 9/27 with LVEF 55-60% and indeterminate diastolic pressures.  Small pericardial effusion noted.  Continue ongoing management.  9/30: Pt extubated.    10/1: Pt diuresing well.  PT recommending SNF.  Pt agreeable.  Consult to [facility] for placement.  10/2: medically ready to discharge, awaiting passr    Assessment & Plan:   Principal Problem:   Acute respiratory failure with hypoxia and hypercapnia ([place]) Active Problems:   Essential hypertension   Alcohol abuse    Sleeps in sitting position due to orthopnea   OSA and COPD overlap syndrome (HCC)   Hyponatremia   Orthostatic hypotension   Nicotine abuse   COPD (chronic obstructive pulmonary disease)/Emphysema   Acute exacerbation of CHF (congestive heart failure) (HCC)   COPD with acute exacerbation (HCC)  Acute hypoxemic and hypercapnic respiratory failure-multifactorial -Related to COPD and CHF exacerbation requiring intubation -Appreciate PCCM assistance with ventilator management -Pt has extubated and breathing better.    Acute CHF exacerbation -Systolic versus diastolic with 2D echocardiogram with small pericardial effusion and LVEF 55-60% performed on 9/27 -Creatinine stable, Lasix for diuresis  Intake/Output Summary (Last 24 hours) at 06/01/2020 1257 Last data filed at 06/01/2020 0900 Gross per 24 hour  Intake 720 ml  Output 1450 ml  Net -730 ml    Acute COPD exacerbation with OSA overlap -Continue IV steroids and bronchodilators -Appreciate PCCM assistance while on ventilator -Procalcitonin less than 0.10   Acute hypercapnic encephalopathy -CT head with no acute findings - treated with mechanical ventilation, now extubated  AKI - resolved -Baseline creatinine 0.7   Hyponatremia/hypochloremia-resolved now after diuresis -Much improved with diuresis, holding for now -Could be related to beer Pot[place]ia  History of alcohol and tobacco abuse -Patient will need nicotine patch and further counseling  History of orthostatic hypotension with recurrent falls -Cortisol noted to be 4.4  History of dural arteriovenous malformation of brain -Patient was recommended image guided diagnostic cerebral arteriogram with intervention by NIR, but refused  DVT prophylaxis:Heparin Code Status: Full code Family Communication: Discussed with son on phone 9/28, 9/30 sister updated. 10/1 son updated telephone  Disposition Plan: SNF when bed available.  Awaiting bed placement.  Status is:  Inpatient  Remains inpatient appropriate because:Altered  mental status, IV treatments appropriate due to intensity of illness or inability to take PO and Inpatient level of care appropriate due to severity of illness  Dispo: The patient is from: Home  Anticipated d/c is to: Home  Anticipated d/c date is: 1 days  Patient currently is medically stable to d/c. Awaiting insurance authorization PASSR  Consultants:   PCCM  Procedures:   See below s/p intubation 9/26, extubated 9/30  Antimicrobials:  Anti-infectives (From admission, onward)   Start     Dose/Rate Route Frequency Ordered Stop   05/30/20 2000  doxycycline (VIBRA-TABS) tablet 100 mg        100 mg Oral Every 12 hours 05/30/20 1329     05/25/20 1445  doxycycline (VIBRAMYCIN) 100 mg in sodium chloride 0.9 % 250 mL IVPB  Status:  Discontinued        100 mg 125 mL/hr over 120 Minutes Intravenous Every 12 hours 05/25/20 1437 05/30/20 1329   05/25/20 1400  doxycycline (VIBRA-TABS) tablet 100 mg  Status:  Discontinued        100 mg Oral Every 12 hours 05/25/20 1345 05/25/20 1437      Subjective: Patient wants to follow up with his urologist Dr. Diona Howard when he is discharged.    Objective: Vitals:   05/31/20 1504 05/31/20 2050 06/01/20 0507 06/01/20 0550  BP: 105/70 121/72 (!) 143/84   Pulse: 98 95 96   Resp: (!) _0 Temp: 98.9 F (37.2 C) 98.4 F (36.9 C) 98.4 F (36.9 C)   TempSrc:  Oral Oral   SpO2: 90% 94% 93%   Weight:    109.4 kg  Height:        Intake/Output Summary (Last 24 hours) at 06/01/2020 1257 Last data filed at 06/01/2020 0900 Gross per 24 hour  Intake 720 ml  Output 1450 ml  Net -730 ml   Filed Weights   05/30/20 0500 05/31/20 0500 06/01/20 0550  Weight: 112 kg 110.3 kg 109.4 kg   Examination:  General exam: awake, alert, no distress, cooperative.   Respiratory system: good air movement, rare bibasilar crackles. Respiratory effort  normal. Cardiovascular system: normal S1 & S2 heard.  Gastrointestinal system: Abdomen is nondistended, soft and nontender.  Central nervous system: nonfocal exam.  Extremities: trace pretibial edema BLEs, SCDs present Skin: No rashes, lesions or ulcers Psychiatry: normal affect.  Data Reviewed: I have personally reviewed following labs and imaging studies  CBC: Recent Labs  Lab 05/28/20 0408 05/29/20 0610 05/30/20 0557 05/31/20 0704 06/01/20 0600  WBC 9.6 8.7 7.7 9.4 6.4  HGB 16.4 17.1* 16.5 16.1 15.8  HCT 50.1 53.2* 52.6* 51.2 51.5  MCV 96.0 99.4 99.4 101.2* 101.4*  PLT 215 168 187 203 226   Basic Metabolic Panel: Recent Labs  Lab 05/27/20 0457 05/27/20 0457 05/28/20 0408 05/29/20 0610 05/30/20 0557 05/31/20 0704 06/01/20 0600  NA 136   < > 138 139 141 142 141  K 3.9   < > 3.8 3.9 3.6 3.4* 3.7  CL 94*   < > 96* 96* 98 97* 94*  CO2 31   < > 32 33* 35* 37* 39*  GLUCOSE 131*   < > 97 92 74 86 90  BUN 45*   < > 40* 36* 28* 22 20  CREATININE 1.21   < > 0.93 0.73 0.58* 0.52* 0.51*  CALCIUM 8.2*   < > 8.4* 8.4* 8.8* 8.9 9.0  MG 1.9  --  2.0  --  1.8 1.8  1.9  PHOS  --   --  3.4  --   --   --   --    < > = values in this interval not displayed.   GFR: Estimated Creatinine Clearance: 108.2 mL/min (A) (by C-G formula based on SCr of 0.51 mg/dL (L)). Liver Function Tests: No results for input(s): AST, ALT, ALKPHOS, BILITOT, PROT, ALBUMIN in the last 168 hours. No results for input(s): LIPASE, AMYLASE in the last 168 hours. No results for input(s): AMMONIA in the last 168 hours. Coagulation Profile: No results for input(s): INR, PROTIME in the last 168 hours. Cardiac Enzymes: No results for input(s): CKTOTAL, CKMB, CKMBINDEX, TROPONINI in the last 168 hours. BNP (last 3 results) No results for input(s): PROBNP in the last 8760 hours. HbA1C: No results for input(s): HGBA1C in the last 72 hours. CBG: Recent Labs  Lab 05/29/20 0002 05/29/20 0332 05/29/20 0732  05/29/20 1129 05/29/20 1533  GLUCAP 96 72 89 107* 102*   Lipid Profile: No results for input(s): CHOL, HDL, LDLCALC, TRIG, CHOLHDL, LDLDIRECT in the last 72 hours. Thyroid Function Tests: No results for input(s): TSH, T4TOTAL, FREET4, T3FREE, THYROIDAB in the last 72 hours. Anemia Panel: No results for input(s): VITAMINB12, FOLATE, FERRITIN, TIBC, IRON, RETICCTPCT in the last 72 hours. Sepsis Labs: Recent Labs  Lab 05/26/20 0701  PROCALCITON <0.10    Recent Results (from the past 240 hour(s))  Urine culture     Status: Abnormal   Collection Time: 05/25/20  8:31 AM   Specimen: Urine, Clean Catch  Result Value Ref Range Status   Specimen Description   Final    URINE, CLEAN CATCH Performed at [facility], [address] [place], [place] [address]    Special Requests   Final    NONE Performed at [facility], [address], [place], [place] [address]    Culture MULTIPLE SPECIES PRESENT, SUGGEST RECOLLECTION (A)  Final   Report Status 05/26/2020 FINAL  Final  Respiratory Panel by RT PCR (Flu A&B, Covid) - Nasopharyngeal Swab     Status: None   Collection Time: 05/25/20  8:33 AM   Specimen: Nasopharyngeal Swab  Result Value Ref Range Status   SARS Coronavirus 2 by RT PCR NEGATIVE NEGATIVE Final    Comment: (NOTE) SARS-CoV-2 target nucleic acids are NOT DETECTED.  The SARS-CoV-2 RNA is generally detectable in upper respiratoy specimens during the acute phase of infection. The lowest concentration of SARS-CoV-2 viral copies this assay can detect is 131 copies/mL. A negative result does not preclude SARS-Cov-2 infection and should not be used as the sole basis for treatment or other patient management decisions. A negative result may occur with  improper specimen collection/handling, submission of specimen other than nasopharyngeal swab, presence of viral mutation(s) within the areas targeted by this assay, and inadequate number of viral copies (<131 copies/mL). A negative  result must be combined with clinical observations, patient history, and epidemiological information. The expected result is Negative.  Fact Sheet for Patients:  PinkCheek.be  Fact Sheet for Healthcare Providers:  GravelBags.it  This test is no t yet approved or cleared by the [place] FDA and  has been authorized for detection and/or diagnosis of SARS-CoV-2 by FDA under an Emergency Use Authorization (EUA). This EUA will remain  in effect (meaning this test can be used) for the duration of the COVID-19 declaration under Section 564(b)(1) of the Act, 21 U.S.C. section 360bbb-3(b)(1), unless the authorization is terminated or revoked sooner.     Influenza  A by PCR NEGATIVE NEGATIVE Final   Influenza B by PCR NEGATIVE NEGATIVE Final    Comment: (NOTE) The Xpert Xpress SARS-CoV-2/FLU/RSV assay is intended as an aid in  the diagnosis of influenza from Nasopharyngeal swab specimens and  should not be used as a sole basis for treatment. Nasal washings and  aspirates are unacceptable for Xpert Xpress SARS-CoV-2/FLU/RSV  testing.  Fact Sheet for Patients: PinkCheek.be  Fact Sheet for Healthcare Providers: GravelBags.it  This test is not yet approved or cleared by the [place] FDA and  has been authorized for detection and/or diagnosis of SARS-CoV-2 by  FDA under an Emergency Use Authorization (EUA). This EUA will remain  in effect (meaning this test can be used) for the duration of the  Covid-19 declaration under Section 564(b)(1) of the Act, 21  U.S.C. section 360bbb-3(b)(1), unless the authorization is  terminated or revoked. Performed at [facility], [address] [place], [place] [address]   Culture, blood (routine x 2)     Status: None   Collection Time: 05/25/20  9:26 AM   Specimen: Right Antecubital; Blood  Result Value Ref Range Status   Specimen  Description   Final    RIGHT ANTECUBITAL BOTTLES DRAWN AEROBIC AND ANAEROBIC   Special Requests Blood Culture adequate volume  Final   Culture   Final    NO GROWTH 5 DAYS Performed at [facility], [address] [place], [place] [address]    Report Status 05/30/2020 FINAL  Final  Culture, blood (routine x 2)     Status: None   Collection Time: 05/25/20  9:26 AM   Specimen: BLOOD RIGHT HAND  Result Value Ref Range Status   Specimen Description BLOOD RIGHT HAND BOTTLES DRAWN AEROBIC ONLY  Final   Special Requests   Final    Blood Culture results may not be optimal due to an inadequate volume of blood received in culture bottles   Culture   Final    NO GROWTH 5 DAYS Performed at [facility], [address] [place], [place] [address]    Report Status 05/30/2020 FINAL  Final  MRSA PCR Screening     Status: None   Collection Time: 05/25/20  2:11 PM   Specimen: Nasal Mucosa; Nasopharyngeal  Result Value Ref Range Status   MRSA by PCR NEGATIVE NEGATIVE Final    Comment:        The GeneXpert MRSA Assay (FDA approved for NASAL specimens only), is one component of a comprehensive MRSA colonization surveillance program. It is not intended to diagnose MRSA infection nor to guide or monitor treatment for MRSA infections. Performed at [facility], [address], [place], [place] [address]   Culture, respiratory (non-expectorated)     Status: None   Collection Time: 05/26/20  8:49 AM   Specimen: Tracheal Aspirate; Respiratory  Result Value Ref Range Status   Specimen Description   Final    TRACHEAL ASPIRATE Performed at [facility], [address], [place], [place] [address]    Special Requests   Final    Normal Performed at [facility] [address], [place], [place] [address]    Gram Stain   Final    RARE WBC PRESENT,BOTH PMN AND MONONUCLEAR FEW GRAM POSITIVE COCCI IN PAIRS RARE GRAM NEGATIVE RODS    Culture   Final    MODERATE Normal respiratory flora-no Staph aureus or  Pseudomonas seen Performed at [facility], [place] [address] [place], [place] [address]  Report Status 05/28/2020 FINAL  Final    Radiology Studies: No results found. Scheduled Meds: . acetaminophen  325 mg Oral Q6H  . chlorhexidine gluconate (MEDLINE KIT)  15 mL Mouth Rinse BID  . Chlorhexidine Gluconate Cloth  6 each Topical Daily  . docusate sodium  100 mg Oral BID  . doxycycline  100 mg Oral Q12H  . feeding supplement (ENSURE ENLIVE)  237 mL Oral BID BM  . furosemide  40 mg Oral Daily  . heparin  5,000 Units Subcutaneous Q8H  . multivitamin with minerals  1 tablet Per Tube Daily  . polyethylene glycol  17 g Oral Daily  . potassium chloride  40 mEq Oral Daily  . predniSONE  40 mg Oral QAC breakfast  . sodium chloride flush  3 mL Intravenous Q12H  . sodium chloride flush  3 mL Intravenous Q12H   Continuous Infusions: . sodium chloride    . famotidine (PEPCID) IV 20 mg (06/01/20 1219)    LOS: 7 days   Irwin Brakeman, MD How to contact the [facility] Attending or Consulting provider [place] or covering provider during after hours [place], for this patient?  1. Check the care team in [facility] and look for a) attending/consulting TRH provider listed and b) the [facility] team listed 2. Log into [email or web address] and use [place]'s universal password to access. If you do not have the password, please contact the hospital operator. 3. Locate the [facility] provider you are looking for under [facility] and page to a number that you can be directly reached. 4. If you still have difficulty reaching the provider, please page the [facility] (Director on Call) for the Hospitalists listed on amion for assistance.  [facility]  If 7PM-7AM, please contact night-coverage [email or web address] 06/01/2020, 12:57 PM

## 2020-06-02 DIAGNOSIS — F1010 Alcohol abuse, uncomplicated: Secondary | ICD-10-CM | POA: Diagnosis not present

## 2020-06-02 DIAGNOSIS — J9601 Acute respiratory failure with hypoxia: Secondary | ICD-10-CM | POA: Diagnosis not present

## 2020-06-02 DIAGNOSIS — I10 Essential (primary) hypertension: Secondary | ICD-10-CM | POA: Diagnosis not present

## 2020-06-02 DIAGNOSIS — J441 Chronic obstructive pulmonary disease with (acute) exacerbation: Secondary | ICD-10-CM | POA: Diagnosis not present

## 2020-06-02 MED ORDER — FAMOTIDINE 20 MG PO TABS
20.0000 mg | ORAL_TABLET | Freq: Every day | ORAL | Status: DC
  Administered 2020-06-03 – 2020-06-07 (×5): 20 mg via ORAL
  Filled 2020-06-02 (×6): qty 1

## 2020-06-02 NOTE — TOC Progression Note (Signed)
Transition of Care [facility]) - Progression Note    Patient Details  Name: MAVERIK FOOT MRN: [id number] Date of Birth: [birth year]  Transition of Care [facility]) CM/SW Contact  Boneta Lucks, RN Phone Number: 06/02/2020, 2:21 PM  Clinical Narrative:   [facility] only bed offer from the choices of SNF provided by sister Charleston Ropes. She is accepting the bed offer. TOC called patient not managed by [place]rett Graff is starting INS Auth. Continuing to work on [employer] documents [place] must requested.     Expected Discharge Plan: Skilled Nursing Facility Barriers to Discharge: Ship broker, Other (comment) (PASSR)  Expected Discharge Plan and Services Expected Discharge Plan: [place] arrangements for the past 2 months: [place]

## 2020-06-02 NOTE — Care Management Important Message (Signed)
Important Message  Patient Details  Name: Steven Howard MRN: [id number] Date of Birth: [birth year]   Medicare Important Message Given:  Yes     Tommy Medal 06/02/2020, 11:49 AM

## 2020-06-02 NOTE — ACP (Advance Care Planning) (Signed)
Provided the Advance Directive information to Mr. Dowda and will follow up to see if he wished to complete the documents.

## 2020-06-02 NOTE — Progress Notes (Signed)
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 Pt. Relates that he was told that it could be surgically deflated,which patient declined due to concerns about some risk of stroke associated with this procedure---presents today to the ED after falling at home and found to have altered mentation with persistent hypercapnia despite BiPAP use in the ED  9/27: Patient was admitted with combined hypoxemic and hypercapnic respiratory failure secondary to combined CHF and COPD exacerbation for which he is required intubation.  He remains on IV Lasix for diuresis as well as breathing treatments and Solu-Medrol.  PCCM following and weaning ventilator as tolerated.  9/28: Patient continues to have significant diuresis with 1.5 L urine output yesterday.  He remains on FiO2 75%.  PCCM plans for chest x-ray in a.m. and decrease Solu-Medrol to 40 mg every 24 hours today.  2D echocardiogram performed 9/27 with LVEF 55-60% and indeterminate diastolic pressures.  Small pericardial effusion noted.  Continue ongoing management.  9/30: Pt extubated.    10/1: Pt diuresing well.  PT recommending SNF.  Pt agreeable.  Consult to [facility] for placement.  10/2: medically ready to discharge, awaiting passr    Assessment & Plan:   Principal Problem:   Acute respiratory failure with hypoxia and hypercapnia (HCC) Active Problems:   Essential hypertension   Alcohol abuse    Sleeps in sitting position due to orthopnea   OSA and COPD overlap syndrome (HCC)   Hyponatremia   Orthostatic hypotension   Nicotine abuse   COPD (chronic obstructive pulmonary disease)/Emphysema   Acute exacerbation of CHF (congestive heart failure) (HCC)   COPD with acute exacerbation (HCC)  Acute hypoxemic and hypercapnic respiratory failure-multifactorial -Related to COPD and CHF exacerbation requiring intubation -Appreciate PCCM assistance with ventilator management -Pt has extubated     Acute CHF exacerbation -Systolic versus diastolic with 2D echocardiogram with small pericardial effusion and LVEF 55-60% performed on 9/27 -Creatinine stable, Lasix for diuresis  Intake/Output Summary (Last 24 hours) at 06/02/2020 1633 Last data filed at 06/02/2020 1232 Gross per 24 hour  Intake 720 ml  Output 1350 ml  Net -630 ml    Acute COPD exacerbation with OSA overlap -Continue IV steroids and bronchodilators -Appreciate PCCM assistance while on ventilator -Procalcitonin less than 0.10   Acute hypercapnic encephalopathy -CT head with no acute findings - treated with mechanical ventilation, now extubated  AKI - resolved -Baseline creatinine 0.7   Hyponatremia/hypochloremia-resolved now after diuresis -Much improved with diuresis, holding for now -Could be related to beer Pot[place]ia  History of alcohol and tobacco abuse -Patient will need nicotine patch and further counseling  History of orthostatic hypotension with recurrent falls -Cortisol noted to be 4.4  History of dural arteriovenous malformation of brain -Patient was recommended image guided diagnostic cerebral arteriogram with intervention by NIR, but refused  DVT prophylaxis:Heparin Code Status: Full code Family Communication: Discussed with son on phone 9/28, 9/30 sister updated. 10/1 son updated telephone  Disposition Plan: SNF when bed available.  Awaiting bed placement.  Status is:  Inpatient  Remains inpatient appropriate because:Altered mental status,  IV treatments appropriate due to intensity of illness or inability to take PO and Inpatient level of care appropriate due to severity of illness  Dispo: The patient is from: Home  Anticipated d/c is to: Home  Anticipated d/c date is: 1 days  Patient currently is medically stable to d/c. Still Awaiting insurance authorization PASSR  Consultants:   PCCM  Procedures:   See below s/p intubation 9/26, extubated 9/30  Antimicrobials:  Anti-infectives (From admission, onward)   Start     Dose/Rate Route Frequency Ordered Stop   05/30/20 2000  doxycycline (VIBRA-TABS) tablet 100 mg        100 mg Oral Every 12 hours 05/30/20 1329     05/25/20 1445  doxycycline (VIBRAMYCIN) 100 mg in sodium chloride 0.9 % 250 mL IVPB  Status:  Discontinued        100 mg 125 mL/hr over 120 Minutes Intravenous Every 12 hours 05/25/20 1437 05/30/20 1329   05/25/20 1400  doxycycline (VIBRA-TABS) tablet 100 mg  Status:  Discontinued        100 mg Oral Every 12 hours 05/25/20 1345 05/25/20 1437      Subjective: Patient without complaints.    Objective: Vitals:   06/01/20 2108 06/02/20 0452 06/02/20 0454 06/02/20 1403  BP:  131/84  133/89  Pulse: 95 96 93 95  Resp:  20  18  Temp:  98.6 F (37 C)  98.5 F (36.9 C)  TempSrc:  Oral  Oral  SpO2: 91% (!) 75% 94% 93%  Weight:  107.5 kg    Height:        Intake/Output Summary (Last 24 hours) at 06/02/2020 1633 Last data filed at 06/02/2020 1232 Gross per 24 hour  Intake 720 ml  Output 1350 ml  Net -630 ml   Filed Weights   05/31/20 0500 06/01/20 0550 06/02/20 0452  Weight: 110.3 kg 109.4 kg 107.5 kg   Examination:  General exam: awake, alert, no distress, cooperative.   Respiratory system: good air movement, rare bibasilar crackles. Respiratory effort normal. Cardiovascular system: normal S1 & S2 heard.  Gastrointestinal system:  Abdomen is nondistended, soft and nontender.  Central nervous system: nonfocal exam.  Extremities: trace pretibial edema BLEs, SCDs present Skin: No rashes, lesions or ulcers Psychiatry: normal affect.  Data Reviewed: I have personally reviewed following labs and imaging studies  CBC: Recent Labs  Lab 05/28/20 0408 05/29/20 0610 05/30/20 0557 05/31/20 0704 06/01/20 0600  WBC 9.6 8.7 7.7 9.4 6.4  HGB 16.4 17.1* 16.5 16.1 15.8  HCT 50.1 53.2* 52.6* 51.2 51.5  MCV 96.0 99.4 99.4 101.2* 101.4*  PLT 215 168 187 203 267   Basic Metabolic Panel: Recent Labs  Lab 05/27/20 0457 05/27/20 0457 05/28/20 0408 05/29/20 0610 05/30/20 0557 05/31/20 0704 06/01/20 0600  NA 136   < > 138 139 141 142 141  K 3.9   < > 3.8 3.9 3.6 3.4* 3.7  CL 94*   < > 96* 96* 98 97* 94*  CO2 31   < > 32 33* 35* 37* 39*  GLUCOSE 131*   < > 97 92 74 86 90  BUN 45*   < > 40* 36* 28* 22 20  CREATININE 1.21   < > 0.93 0.73 0.58* 0.52* 0.51*  CALCIUM 8.2*   < > 8.4* 8.4* 8.8* 8.9 9.0  MG 1.9  --  2.0  --  1.8 1.8 1.9  PHOS  --   --  3.4  --   --   --   --    < > =  values in this interval not displayed.   GFR: Estimated Creatinine Clearance: 107.3 mL/min (A) (by C-G formula based on SCr of 0.51 mg/dL (L)). Liver Function Tests: No results for input(s): AST, ALT, ALKPHOS, BILITOT, PROT, ALBUMIN in the last 168 hours. No results for input(s): LIPASE, AMYLASE in the last 168 hours. No results for input(s): AMMONIA in the last 168 hours. Coagulation Profile: No results for input(s): INR, PROTIME in the last 168 hours. Cardiac Enzymes: No results for input(s): CKTOTAL, CKMB, CKMBINDEX, TROPONINI in the last 168 hours. BNP (last 3 results) No results for input(s): PROBNP in the last 8760 hours. HbA1C: No results for input(s): HGBA1C in the last 72 hours. CBG: Recent Labs  Lab 05/29/20 0002 05/29/20 0332 05/29/20 0732 05/29/20 1129 05/29/20 1533  GLUCAP 96 72 89 107* 102*   Lipid Profile: No  results for input(s): CHOL, HDL, LDLCALC, TRIG, CHOLHDL, LDLDIRECT in the last 72 hours. Thyroid Function Tests: No results for input(s): TSH, T4TOTAL, FREET4, T3FREE, THYROIDAB in the last 72 hours. Anemia Panel: No results for input(s): VITAMINB12, FOLATE, FERRITIN, TIBC, IRON, RETICCTPCT in the last 72 hours. Sepsis Labs: No results for input(s): PROCALCITON, LATICACIDVEN in the last 168 hours.  Recent Results (from the past 240 hour(s))  Urine culture     Status: Abnormal   Collection Time: 05/25/20  8:31 AM   Specimen: Urine, Clean Catch  Result Value Ref Range Status   Specimen Description   Final    URINE, CLEAN CATCH Performed at [facility], [address] [place], [place] [address]    Special Requests   Final    NONE Performed at [facility], [address] [place], [place] [address]    Culture MULTIPLE SPECIES PRESENT, SUGGEST RECOLLECTION (A)  Final   Report Status 05/26/2020 FINAL  Final  Respiratory Panel by RT PCR (Flu A&B, Covid) - Nasopharyngeal Swab     Status: None   Collection Time: 05/25/20  8:33 AM   Specimen: Nasopharyngeal Swab  Result Value Ref Range Status   SARS Coronavirus 2 by RT PCR NEGATIVE NEGATIVE Final    Comment: (NOTE) SARS-CoV-2 target nucleic acids are NOT DETECTED.  The SARS-CoV-2 RNA is generally detectable in upper respiratoy specimens during the acute phase of infection. The lowest concentration of SARS-CoV-2 viral copies this assay can detect is 131 copies/mL. A negative result does not preclude SARS-Cov-2 infection and should not be used as the sole basis for treatment or other patient management decisions. A negative result may occur with  improper specimen collection/handling, submission of specimen other than nasopharyngeal swab, presence of viral mutation(s) within the areas targeted by this assay, and inadequate number of viral copies (<131 copies/mL). A negative result must be combined with clinical observations, patient  history, and epidemiological information. The expected result is Negative.  Fact Sheet for Patients:  PinkCheek.be  Fact Sheet for Healthcare Providers:  GravelBags.it  This test is no t yet approved or cleared by the [place] FDA and  has been authorized for detection and/or diagnosis of SARS-CoV-2 by FDA under an Emergency Use Authorization (EUA). This EUA will remain  in effect (meaning this test can be used) for the duration of the COVID-19 declaration under Section 564(b)(1) of the Act, 21 U.S.C. section 360bbb-3(b)(1), unless the authorization is terminated or revoked sooner.     Influenza A by PCR NEGATIVE NEGATIVE Final   Influenza B by PCR NEGATIVE NEGATIVE Final    Comment: (NOTE) The Xpert Xpress SARS-CoV-2/FLU/RSV assay is intended  as an aid in  the diagnosis of influenza from Nasopharyngeal swab specimens and  should not be used as a sole basis for treatment. Nasal washings and  aspirates are unacceptable for Xpert Xpress SARS-CoV-2/FLU/RSV  testing.  Fact Sheet for Patients: PinkCheek.be  Fact Sheet for Healthcare Providers: GravelBags.it  This test is not yet approved or cleared by the [place] FDA and  has been authorized for detection and/or diagnosis of SARS-CoV-2 by  FDA under an Emergency Use Authorization (EUA). This EUA will remain  in effect (meaning this test can be used) for the duration of the  Covid-19 declaration under Section 564(b)(1) of the Act, 21  U.S.C. section 360bbb-3(b)(1), unless the authorization is  terminated or revoked. Performed at [facility], [address], [place], [place] [address]   Culture, blood (routine x 2)     Status: None   Collection Time: 05/25/20  9:26 AM   Specimen: Right Antecubital; Blood  Result Value Ref Range Status   Specimen Description   Final    RIGHT ANTECUBITAL BOTTLES DRAWN  AEROBIC AND ANAEROBIC   Special Requests Blood Culture adequate volume  Final   Culture   Final    NO GROWTH 5 DAYS Performed at [facility], [address] [place], [place] [address]    Report Status 05/30/2020 FINAL  Final  Culture, blood (routine x 2)     Status: None   Collection Time: 05/25/20  9:26 AM   Specimen: BLOOD RIGHT HAND  Result Value Ref Range Status   Specimen Description BLOOD RIGHT HAND BOTTLES DRAWN AEROBIC ONLY  Final   Special Requests   Final    Blood Culture results may not be optimal due to an inadequate volume of blood received in culture bottles   Culture   Final    NO GROWTH 5 DAYS Performed at [facility], [address] [place], [place] [address]    Report Status 05/30/2020 FINAL  Final  MRSA PCR Screening     Status: None   Collection Time: 05/25/20  2:11 PM   Specimen: Nasal Mucosa; Nasopharyngeal  Result Value Ref Range Status   MRSA by PCR NEGATIVE NEGATIVE Final    Comment:        The GeneXpert MRSA Assay (FDA approved for NASAL specimens only), is one component of a comprehensive MRSA colonization surveillance program. It is not intended to diagnose MRSA infection nor to guide or monitor treatment for MRSA infections. Performed at [facility], [address] [place], [place] [address]   Culture, respiratory (non-expectorated)     Status: None   Collection Time: 05/26/20  8:49 AM   Specimen: Tracheal Aspirate; Respiratory  Result Value Ref Range Status   Specimen Description   Final    TRACHEAL ASPIRATE Performed at [facility], [address] [place], [place] [address]    Special Requests   Final    Normal Performed at [facility], [address] [place], [place] [address]    Gram Stain   Final    RARE WBC PRESENT,BOTH PMN AND MONONUCLEAR FEW GRAM POSITIVE COCCI IN PAIRS RARE GRAM NEGATIVE RODS    Culture   Final    MODERATE Normal respiratory flora-no Staph aureus or Pseudomonas seen Performed at [facility],  [place] [address], [place],  [address]    Report Status 05/28/2020 FINAL  Final    Radiology Studies: No results found. Scheduled Meds: . acetaminophen  325 mg Oral Q6H  . chlorhexidine gluconate (  MEDLINE KIT)  15 mL Mouth Rinse BID  . Chlorhexidine Gluconate Cloth  6 each Topical Daily  . docusate sodium  100 mg Oral BID  . doxycycline  100 mg Oral Q12H  . [START ON 06/03/2020] famotidine  20 mg Oral Daily  . feeding supplement (ENSURE ENLIVE)  237 mL Oral BID BM  . furosemide  40 mg Oral Daily  . heparin  5,000 Units Subcutaneous Q8H  . multivitamin with minerals  1 tablet Per Tube Daily  . potassium chloride  40 mEq Oral Daily  . predniSONE  40 mg Oral QAC breakfast  . sodium chloride flush  3 mL Intravenous Q12H  . sodium chloride flush  3 mL Intravenous Q12H   Continuous Infusions: . sodium chloride      LOS: 8 days   Irwin Brakeman, MD How to contact the [facility] Attending or Consulting provider [place] or covering provider during after hours [place], for this patient?  1. Check the care team in [facility] and look for a) attending/consulting [facility] provider listed and b) the [facility] team listed 2. Log into [email or web address] and use [place]'s universal password to access. If you do not have the password, please contact the hospital operator. 3. Locate the [facility] provider you are looking for under [facility] and page to a number that you can be directly reached. 4. If you still have difficulty reaching the provider, please page the [facility] (Director on Call) for the Hospitalists listed on amion for assistance.  [facility]  If 7PM-7AM, please contact night-coverage [email or web address] 06/02/2020, 4:33 PM

## 2020-06-03 DIAGNOSIS — J441 Chronic obstructive pulmonary disease with (acute) exacerbation: Secondary | ICD-10-CM | POA: Diagnosis not present

## 2020-06-03 DIAGNOSIS — J9601 Acute respiratory failure with hypoxia: Secondary | ICD-10-CM | POA: Diagnosis not present

## 2020-06-03 DIAGNOSIS — I10 Essential (primary) hypertension: Secondary | ICD-10-CM | POA: Diagnosis not present

## 2020-06-03 DIAGNOSIS — I5033 Acute on chronic diastolic (congestive) heart failure: Secondary | ICD-10-CM | POA: Diagnosis not present

## 2020-06-03 LAB — SARS CORONAVIRUS 2 BY RT PCR (HOSPITAL ORDER, PERFORMED IN ~~LOC~~ HOSPITAL LAB): SARS Coronavirus 2: NEGATIVE

## 2020-06-03 MED ORDER — FUROSEMIDE 40 MG PO TABS
40.0000 mg | ORAL_TABLET | Freq: Every day | ORAL | Status: DC
Start: 2020-06-03 — End: 2020-06-07

## 2020-06-03 MED ORDER — POTASSIUM CHLORIDE CRYS ER 20 MEQ PO TBCR: 40.0000 meq | EXTENDED_RELEASE_TABLET | Freq: Every day | ORAL | Status: DC

## 2020-06-03 MED ORDER — GUAIFENESIN-DM 100-10 MG/5ML PO SYRP
5.0000 mL | ORAL_SOLUTION | ORAL | Status: DC | PRN
  Administered 2020-06-03 – 2020-06-06 (×5): 5 mL via ORAL
  Filled 2020-06-03 (×5): qty 5

## 2020-06-03 MED ORDER — DOCUSATE SODIUM 100 MG PO CAPS: 100.0000 mg | ORAL_CAPSULE | Freq: Every day | ORAL | 0 refills | Status: DC

## 2020-06-03 MED ORDER — POLYETHYLENE GLYCOL 3350 17 G PO PACK: 17.0000 g | PACK | Freq: Every day | ORAL | 0 refills | Status: DC | PRN

## 2020-06-03 MED ORDER — PREDNISONE 20 MG PO TABS: ORAL_TABLET | ORAL | Status: DC

## 2020-06-03 MED ORDER — ALBUTEROL SULFATE HFA 108 (90 BASE) MCG/ACT IN AERS: 2.0000 | INHALATION_SPRAY | RESPIRATORY_TRACT | 1 refills | Status: DC | PRN

## 2020-06-03 MED ORDER — ADULT MULTIVITAMIN W/MINERALS CH: 1.0000 | ORAL_TABLET | Freq: Every day | ORAL | Status: DC

## 2020-06-03 MED ORDER — IPRATROPIUM-ALBUTEROL 0.5-2.5 (3) MG/3ML IN SOLN: 3.0000 mL | RESPIRATORY_TRACT | Status: DC | PRN

## 2020-06-03 MED ORDER — NICOTINE 21 MG/24HR TD PT24: 21.0000 mg | MEDICATED_PATCH | Freq: Every day | TRANSDERMAL | 0 refills | Status: DC | PRN

## 2020-06-03 NOTE — Progress Notes (Signed)
Physical Therapy Treatment Patient Details Name: Steven Howard MRN: [id number] DOB: 1950-11-14 Today's Date: 06/03/2020    History of Present Illness 69 year old smoker with known COPD, BIBEMS for altered mental status and after a fall at home, found to have ABG 7.15/103/140 on 100% FiO2 after 2 hours of BiPAP and required mechanical ventilation.  BNP was 455, creatinine 1.5, sodium 128, chest x-ray showed bilateral interstitial prominence consistent with CHF and trace bilateral effusions.He was admitted to ICU, sedated, diuresed.  He was also treated with IV steroids bronchodilators and doxycycline for an acute COPD exacerbation.  PCCM consulted to assist with ICU management    PT Comments    Upon arrival, pt coughing with nasal cannula removed and SpO2 74%, so returned 3.5L with SpO2 87% and cued for pursed lip breathing without improvement, so increased to 4L O2 with SpO2 90-94% during session. Pt reports intermittent "air head" sensation during therapy and requires re-attention to task cues to complete exercises. Pt with good BLE strength, able to perform seated exercises and STS reps for strengthening without physical assistance. Pt limited with ambulation due to "air head" sensation and requests to return to supine instead of transferring to bedside chair. Pt left supine with HOB elevated, family in room and RN notified of session/O2. Patient will benefit from continued physical therapy in hospital and recommendations below to increase strength, balance, endurance for safe ADLs and gait.    Follow Up Recommendations  SNF;Supervision for mobility/OOB     Equipment Recommendations  None recommended by PT    Recommendations for Other Services       Precautions / Restrictions Precautions Precautions: Fall Restrictions Weight Bearing Restrictions: No    Mobility  Bed Mobility Overal bed mobility: Needs Assistance Bed Mobility: Supine to Sit  Supine to sit: Min guard;HOB elevated     General bed mobility comments: increased time and cues for sequencing to come to sitting EOB  Transfers Overall transfer level: Needs assistance Equipment used: Rolling walker (2 wheeled) Transfers: Sit to/from Stand Sit to Stand: Min guard;From elevated surface    General transfer comment: min G with constant cues on hand placement to push from seated surface and avoid pulling from RW  Ambulation/Gait Ambulation/Gait assistance: Min guard  Assistive device: Rolling walker (2 wheeled)  General Gait Details: limited to a few steps at bedside due to "air head" complaints   Stairs             Wheelchair Mobility    Modified Rankin (Stroke Patients Only)       Balance Overall balance assessment: Needs assistance Sitting-balance support: Feet supported;No upper extremity supported Sitting balance-Leahy Scale: Good Sitting balance - Comments: good seated EOB   Standing balance support: During functional activity;Bilateral upper extremity supported Standing balance-Leahy Scale: Fair Standing balance comment: static without UE support       Cognition Arousal/Alertness: Awake/alert Behavior During Therapy: WFL for tasks assessed/performed Overall Cognitive Status: Within Functional Limits for tasks assessed             Exercises General Exercises - Lower Extremity Long Arc Quad: Seated;AROM;Strengthening;Both;15 reps Hip Flexion/Marching: Seated;AROM;Strengthening;Both;15 reps Other Exercises Other Exercises: STS 15 reps from EOB, RW for steadying    General Comments General comments (skin integrity, edema, etc.): on RA with SpO2 74% upon arrival due to pt removed nasal cannula, returned 3.5L with SpO2 87% and cued for pursed lip breathing, increased to 4L O2 with SpO2 90-94% with exercise/mobility      Pertinent Vitals/Pain Pain  Assessment: No/denies pain    Home Living                      Prior Function            PT Goals (current goals  can now be found in the care plan section) Acute Rehab PT Goals Patient Stated Goal: Go home. PT Goal Formulation: With patient Time For Goal Achievement: 06/12/20 Potential to Achieve Goals: Good Progress towards PT goals: Progressing toward goals    Frequency    Min 3X/week      PT Plan Current plan remains appropriate    Co-evaluation              AM-PAC PT "6 Clicks" Mobility   Outcome Measure  Help needed turning from your back to your side while in a flat bed without using bedrails?: A Little Help needed moving from lying on your back to sitting on the side of a flat bed without using bedrails?: A Little Help needed moving to and from a bed to a chair (including a wheelchair)?: A Little Help needed standing up from a chair using your arms (e.g., wheelchair or bedside chair)?: A Little Help needed to walk in hospital room?: A Little Help needed climbing 3-5 steps with a railing? : A Lot 6 Click Score: 17    End of Session Equipment Utilized During Treatment: Oxygen Activity Tolerance: Patient tolerated treatment well Patient left: in bed;with call bell/phone within reach;with family/visitor present Nurse Communication: Mobility status;Other (comment) (O2) PT Visit Diagnosis: Unsteadiness on feet (R26.81);Other abnormalities of gait and mobility (R26.89);Muscle weakness (generalized) (M62.81);Difficulty in walking, not elsewhere classified (R26.2)     Time: 4462-8638 PT Time Calculation (min) (ACUTE ONLY): 36 min  Charges:  $Therapeutic Exercise: 8-22 mins $Therapeutic Activity: 8-22 mins                      Tori Wanette Robison PT, DPT 06/03/20, 2:10 PM [phone]

## 2020-06-03 NOTE — Progress Notes (Signed)
1035: In to give am meds, pt seemed drowsier than earlier this am. Oriented only to person. Noted that pt had pulled O2 off, [place] lying in bed. SaO2 checked with reading of 81% on RA. O2 replaced. 1050: Pt's SaO2 up to 94-95% on 3lpm. Pt now less drowsy, still oriented only to person. When asked where he is, states, "In my room." but unable to tell me what place this is or date. Pt did talk with sister on phone and had fairly normal conversation with her.

## 2020-06-03 NOTE — Discharge Summary (Addendum)
Physician Discharge Summary  NEILS SIRACUSA IPJ:[id number] DOB: 1950/09/07 DOA: 05/25/2020  PCP: Claretta Fraise, MD  Admit date: 05/25/2020 Discharge date: 06/03/2020  Admitted From:  HOME  Disposition:  SNF   Recommendations for Outpatient Follow-up:  1. Follow up with PCP in 2 weeks 2. Follow up with urologist on 10/7 as scheduled if possible or reschedule 3. Follow up with cardiology in 1 month 4. Please obtain BMP in 1 week to follow electrolytes  Discharge Condition: STABLE   CODE STATUS: FULL    Brief Hospitalization Summary: Please see all hospital notes, images, labs for full details of the hospitalization. ADMISSION HPI:  Steven Howard  is a 69 y.o. 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 Pt. Relates that he was told that it could be surgically deflated, which patient declined due to concerns about some risk of stroke associated with this procedure--- presents today to the ED after falling at home and found to have altered mentation with persistent hypercapnia despite BiPAP use in the ED  -At the time of my evaluation patient is orally intubated and sedated by EDP -History obtained from patient's sisterMs Marguerite Olea and ED records and EDP -- -CT head without contrast ordered and pending--due to unwitnessed fall with altered mentation  Chest x-ray with ET tube and NG tube, also shows cardiomegaly with pulmonary venous congestion with trace bilateral pleural effusions --BNP is 455, no baseline available, troponin is 13 -CBC essentially WNL, lactic acid 1.0 -Chemistry with a sodium  of 128 chloride of 89, glucose 100, LFTs noted -Creatinine up to 1.49 from baseline of 0.7 - ABG with a pH of 7.149, PCO2 of 103, PO2 of 140-=--ABG was done on 100% FiO2 after couple of hours of continuous BiPAP use  Hospital Course  9/27:Patient was admitted with combined hypoxemic and hypercapnic respiratory failure secondary to combined CHF and COPD exacerbation for which he is required intubation. He remains on IV Lasix for diuresis as well as breathing treatments and Solu-Medrol. PCCM following and weaning ventilator as tolerated.  9/28: Patient continues to have significant diuresis with 1.5 L urine output yesterday.  He remains on FiO2 75%.  PCCM plans for chest x-ray in a.m. and decrease Solu-Medrol to 40 mg every 24 hours today.  2D echocardiogram performed 9/27 with LVEF 55-60% and indeterminate diastolic pressures.  Small pericardial effusion noted.  Continue ongoing management.  9/30: Pt extubated.    10/1: Pt diuresing well.  PT recommending SNF.  Pt agreeable.  Cons[facility] to [facility] for [facility].  10/2: medically ready to discharge, awaiting passr    10/5: authorization from insurance pending DC to SNF   Assessment & Plan:   Principal Problem:   Acute respiratory failure with hypoxia and hypercapnia (HCC) Active Problems:   Essential hypertension   Alcohol abuse   Sleeps in sitting position due to orthopnea   OSA and COPD overlap syndrome (HCC)   Hyponatremia   Orthostatic hypotension   Nicotine abuse   COPD (chronic obstructive pulmonary disease)/Emphysema   Acute exacerbation of CHF (congestive heart failure) (HCC)   COPD with acute exacerbation (HCC)  Acute hypoxemic and hypercapnic respiratory failure-multifactorial -Related to COPD and CHF  exacerbation requiring intubation -Appreciate PCCM assistance with ventilator management -Pt has extubated and has done well for several days    Acute DIASTOLIC CHF exacerbation -2D echocardiogram with small  pericardial effusion and LVEF 55-60% performed on 9/27 -Creatinine stable, Lasix 40 mg daily for diuresis with potassium supplement.  HE HAS DIURESED 8.3 LITERS OF FLUID    Acute COPD exacerbation with OSA overlap -Continue IV steroids and bronchodilators -Appreciate PCCM assistance while on ventilator -Procalcitonin less than 0.10 -Discharging on a steroid taper.    Acute hypercapnic encephalopathy -CT head with no acute findings - treated with mechanical ventilation, now extubated and breathing well for several days.   AKI - resolved -Baseline creatinine 0.7   Hyponatremia/hypochloremia-resolved now after diuresis -Much improved with diuresis, holding for now -Could be related to beer [place]  History of alcohol and tobacco abuse -Patient will need nicotine patch and further counseling  History of orthostatic hypotension with recurrent falls -Cortisol noted to be 4.4  History of dural arteriovenous malformation of brain -Patient was recommended image guided diagnostic cerebral arteriogram with intervention by NIR, but refused  DVT prophylaxis:Heparin Code Status:Full code Family Communication:Discussed with son on phone 9/28, 9/30 sister updated. 10/1 son updated telephone  Disposition Plan:SNF   Discharge Diagnoses:  Principal Problem:   Acute respiratory failure with hypoxia and hypercapnia (HCC) Active Problems:   Essential hypertension   Alcohol abuse   Sleeps in sitting position due to orthopnea   OSA and COPD overlap syndrome (HCC)   Hyponatremia   Orthostatic hypotension   Nicotine abuse   COPD (chronic obstructive pulmonary disease)/Emphysema   Acute exacerbation of CHF (congestive heart failure) (HCC)   COPD with acute exacerbation [facility])   Discharge Instructions: Discharge Instructions    Ambulatory referral to Urology   Complete by: As directed    Missed appt with [place] while in hospital     Allergies as of 06/03/2020   No Known  Allergies     Medication List    TAKE these medications   albuterol 108 (90 Base) MCG/ACT inhaler Commonly known as: VENTOLIN HFA Inhale 2 puffs into the lungs every 4 (four) hours as needed for wheezing or shortness of breath (cough, shortness of breath or wheezing.).   Breo Ellipta 100-25 MCG/INH Aepb Generic drug: fluticasone furoate-vilanterol INHALE 1 PUFF INTO LUNGS DAILY What changed: See the new instructions.   docusate sodium 100 MG capsule Commonly known as: COLACE Take 1 capsule (100 mg total) by mouth daily.   furosemide 40 MG tablet Commonly known as: LASIX Take 1 tablet (40 mg total) by mouth daily.   ipratropium-albuterol 0.5-2.5 (3) MG/3ML Soln Commonly known as: DUONEB Take 3 mLs by nebulization every 4 (four) hours as needed.   multivitamin with minerals Tabs tablet Take 1 tablet by mouth daily.   nicotine 21 mg/24hr patch Commonly known as: NICODERM CQ - dosed in mg/24 hours Place 1 patch (21 mg total) onto the skin daily as needed (nicotine cravings).   polyethylene glycol 17 g packet Commonly known as: MIRALAX / GLYCOLAX Take 17 g by mouth daily as needed for mild constipation.   potassium chloride SA 20 MEQ tablet Commonly known as: KLOR-CON Take 2 tablets (40 mEq total) by mouth daily.   predniSONE 20 MG tablet Commonly known as: DELTASONE Take 2 tabs daily with breakfast x 3 days, then 1 tab daily x 7 days       Contact information for follow-up providers    Claretta Fraise, MD. Schedule an  appointment as soon as possible for a visit in 2 week(s).   Specialty: Family Medicine Contact information: [place] [address] [phone]        Minus Breeding, MD. Schedule an appointment as soon as possible for a visit in 1 month(s).   Specialty: Cardiology Contact information: [place] [address] [phone]        Cleon Gustin, MD. Go on 06/05/2020.   Specialty: Urology Why: As already  scheduled for Urology Follow Up appointment  Contact information: [address] [place] [address] [phone]            Contact information for after-discharge care    [facility] .   Service: Skilled Nursing Contact information: [address] [address] [phone]                 No Known Allergies Allergies as of 06/03/2020   No Known Allergies     Medication List    TAKE these medications   albuterol 108 (90 Base) MCG/ACT inhaler Commonly known as: VENTOLIN HFA Inhale 2 puffs into the lungs every 4 (four) hours as needed for wheezing or shortness of breath (cough, shortness of breath or wheezing.).   Breo Ellipta 100-25 MCG/INH Aepb Generic drug: fluticasone furoate-vilanterol INHALE 1 PUFF INTO LUNGS DAILY What changed: See the new instructions.   docusate sodium 100 MG capsule Commonly known as: COLACE Take 1 capsule (100 mg total) by mouth daily.   furosemide 40 MG tablet Commonly known as: LASIX Take 1 tablet (40 mg total) by mouth daily.   ipratropium-albuterol 0.5-2.5 (3) MG/3ML Soln Commonly known as: DUONEB Take 3 mLs by nebulization every 4 (four) hours as needed.   multivitamin with minerals Tabs tablet Take 1 tablet by mouth daily.   nicotine 21 mg/24hr patch Commonly known as: NICODERM CQ - dosed in mg/24 hours Place 1 patch (21 mg total) onto the skin daily as needed (nicotine cravings).   polyethylene glycol 17 g packet Commonly known as: MIRALAX / GLYCOLAX Take 17 g by mouth daily as needed for mild constipation.   potassium chloride SA 20 MEQ tablet Commonly known as: KLOR-CON Take 2 tablets (40 mEq total) by mouth daily.   predniSONE 20 MG tablet Commonly known as: DELTASONE Take 2 tabs daily with breakfast x 3 days, then 1 tab daily x 7 days       Procedures/Studies: CT HEAD WO CONTRAST  Result Date: 05/25/2020 CLINICAL DATA:  Mental status  change EXAM: CT HEAD WITHOUT CONTRAST TECHNIQUE: Contiguous axial images were obtained from the base of the skull through the vertex without intravenous contrast. COMPARISON:  05/25/2019 MRI head. FINDINGS: Brain: No acute infarct or intracranial hemorrhage. No mass lesion. No midline shift, ventriculomegaly or extra-axial fluid collection. Vascular: Right temporal calcifications likely correspond to known dural arteriovenous malformation, better demonstrated on prior MRI. Bilateral carotid siphon atherosclerotic calcifications. Skull: Negative for fracture or focal lesion. Sinuses/Orbits: Normal orbits. Mild pansinus mucosal thickening. No mastoid effusion. Other: None. IMPRESSION: No acute infarct or intracranial hemorrhage. Sequela of right temporal dural AVM. Electronically Signed   By: Primitivo Gauze M.D.   On: 05/25/2020 14:17   DG CHEST PORT 1 VIEW  Result Date: 05/29/2020 CLINICAL DATA:  CHF exacerbation. EXAM: PORTABLE CHEST 1 VIEW COMPARISON:  05/28/2020. FINDINGS: Interim extubation and removal of NG tube. Cardiomegaly. Diffuse bilateral interstitial prominence again noted. Interstitial  edema and/or pneumonitis could present in this fashion. Persistent bibasilar atelectasis. No prominent pleural effusion. No pneumothorax. Degenerative change thoracic spine. IMPRESSION: 1. Interim extubation removal of NG tube. 2. Severe cardiomegaly again noted. Diffuse bilateral interstitial prominence again noted. Interstitial edema and/or pneumonitis could present in this fashion. Persistent bibasilar atelectasis. Similar findings noted on prior exam. Electronically Signed   By: Devon   On: 05/29/2020 12:42   DG CHEST PORT 1 VIEW  Result Date: 05/28/2020 CLINICAL DATA:  Orogastric tube placement. EXAM: PORTABLE CHEST 1 VIEW COMPARISON:  Chest radiographs o from the same day FINDINGS: ET tube approximately 4.8 cm above the carina. Orogastric tube courses below the diaphragm with the tip outside  the field of view. The side port is below the GE junction. Similar enlargement of the cardiac silhouette. Similar small bilateral pleural effusions. No discernible pneumothorax. Similar vascular congestion. IMPRESSION: 1. Orogastric tube courses below the diaphragm with the tip outside the field of view. 2. Similar pulmonary vascular congestion and possible small bilateral pleural effusions. Electronically Signed   By: Margaretha Sheffield MD   On: 05/28/2020 20:14   DG CHEST PORT 1 VIEW  Result Date: 05/28/2020 CLINICAL DATA:  Acute respiratory failure and OG tube placement EXAM: PORTABLE CHEST 1 VIEW COMPARISON:  Radiograph 05/25/2020 FINDINGS: Tip of the transesophageal tube terminates in the left upper quadrant with the side port closely approximating the expected location of the GE junction. Could consider advancing 3-5 cm for optimal positioning. Endotracheal tube tip terminates in the mid trachea, 3.2 cm from the carina. Telemetry leads overlie the chest. Surgical clip projects over the right heart border. Bilateral pleural effusions, right greater than left with adjacent opacities, likely passive atelectasis. Some diffuse hazy interstitial opacity is central pulmonary vascular congestion is noted as well. Cardiomediastinal contours are similar to prior counting for differences in technique. The aorta is calcified. The remaining cardiomediastinal contours are unremarkable. IMPRESSION: Tip of the transesophageal tube terminates in the left upper quadrant with the side port closely approximating the expected location of the GE junction. Could consider advancing 3-5 cm for optimal positioning. Stable satisfactory positioning of the endotracheal tube. Features suggesting CHF/volume overload with cardiomegaly, vascular congestion, bilateral effusions and interstitial edema. Opacities adjacent the pleural effusions likely passive atelectatic change though underlying consolidation is not excluded. Electronically  Signed   By: Lovena Le M.D.   On: 05/28/2020 00:44   DG CHEST PORT 1 VIEW  Result Date: 05/26/2020 CLINICAL DATA:  Dizziness after a fall yesterday. History of prostate cancer and COPD. Smoker. EXAM: PORTABLE CHEST 1 VIEW COMPARISON:  05/25/2020 FINDINGS: Enteric and endotracheal tubes are unchanged in position since prior study. Shallow inspiration. Diffuse cardiac enlargement. Bilateral interstitial pattern to the lungs consistent with edema. Bilateral pleural effusions. Calcification of the aorta. IMPRESSION: Cardiac enlargement with bilateral interstitial edema and bilateral pleural effusions. Appliances are unchanged in position. Electronically Signed   By: Lucienne Capers M.D.   On: 05/26/2020 06:24   DG Chest Portable 1 View  Result Date: 05/25/2020 CLINICAL DATA:  Shortness of breath.  Intubation. EXAM: PORTABLE CHEST 1 VIEW COMPARISON:  05/25/2020 chest radiograph FINDINGS: An endotracheal tube is identified with tip 4.5 cm above the carina. An NG tube is present entering the UPPER abdomen with tip off the field of view. Pulmonary vascular congestion and possible bilateral trace pleural effusions again noted. No other changes identified. IMPRESSION: 1. Endotracheal tube with tip 4.5 cm above the carina. NG tube placement. 2. Pulmonary vascular congestion  and possible trace bilateral pleural effusions. Electronically Signed   By: Margarette Canada M.D.   On: 05/25/2020 11:43   DG Chest Port 1 View  Result Date: 05/25/2020 CLINICAL DATA:  69 year old male with acute shortness of breath. EXAM: PORTABLE CHEST 1 VIEW COMPARISON:  None. FINDINGS: Cardiomegaly identified. Pulmonary vascular congestion is present with possible trace effusions. There is no evidence of pneumothorax, mass or consolidation. No acute bony abnormalities are present. IMPRESSION: Cardiomegaly with pulmonary vascular congestion and possible trace effusions. Electronically Signed   By: Margarette Canada M.D.   On: 05/25/2020 09:24    ECHOCARDIOGRAM COMPLETE  Result Date: 05/26/2020    ECHOCARDIOGRAM REPORT   Patient Name:   TAYRON HUNNELL Date of Exam: 05/26/2020 Medical Rec #:  [id number]      Height:       72.0 in Accession #:    [id number]     Weight:       249.6 lb Date of Birth:  [birth year]      BSA:          2.341 m Patient Age:    69 years       BP:           105/54 mmHg Patient Gender: M              HR:           66 bpm. Exam Location:  [facility] Procedure: 2D Echo and Intracardiac Opacification Agent Indications:    Dyspnea 786.09 / R06.00  History:        Patient has no prior history of Echocardiogram examinations.                 CHF, COPD; Risk Factors:Current Smoker and Sleep Apnea. Past                 history of Orthostatic hypotension and cancer.  Sonographer:    Darlina Sicilian RDCS Referring Phys: [phone] [facility]  Sonographer Comments: Echo performed with patient supine and on artificial respirator. IMPRESSIONS  1. Left ventricular ejection fraction, by estimation, is 55 to 60%. The left ventricle has normal function. The left ventricle has no regional wall motion abnormalities. There is mild left ventricular hypertrophy. Left ventricular diastolic parameters are indeterminate.  2. Right ventricular systolic function is moderately reduced. The right ventricular size is moderately enlarged.  3. A small pericardial effusion is present. The pericardial effusion is circumferential. There is no evidence of cardiac tamponade.  4. The mitral valve was not well visualized. No evidence of mitral valve regurgitation. No evidence of mitral stenosis.  5. The aortic valve is tricuspid. Aortic valve regurgitation is not visualized. No aortic stenosis is present.  6. The inferior vena cava is dilated in size with <50% respiratory variability, suggesting right atrial pressure of 15 mmHg.  7. Technically difficult study FINDINGS  Left Ventricle: Left ventricular ejection fraction, by estimation, is 55 to 60%. The left ventricle has  normal function. The left ventricle has no regional wall motion abnormalities. Definity contrast agent was given IV to delineate the left ventricular  endocardial borders. The left ventricular internal cavity size was normal in size. There is mild left ventricular hypertrophy. Left ventricular diastolic parameters are indeterminate. Right Ventricle: The right ventricular size is moderately enlarged. Right vetricular wall thickness was not well visualized. Right ventricular systolic function is moderately reduced. Left Atrium: Left atrial size was not well visualized. Right Atrium: Right atrial size was not well visualized. Pericardium:  A small pericardial effusion is present. The pericardial effusion is circumferential. There is no evidence of cardiac tamponade. Mitral Valve: The mitral valve was not well visualized. No evidence of mitral valve regurgitation. No evidence of mitral valve stenosis. Tricuspid Valve: The tricuspid valve is not well visualized. Tricuspid valve regurgitation is trivial. No evidence of tricuspid stenosis. Aortic Valve: The aortic valve is tricuspid. Aortic valve regurgitation is not visualized. No aortic stenosis is present. Pulmonic Valve: The pulmonic valve was not well visualized. Pulmonic valve regurgitation is not visualized. No evidence of pulmonic stenosis. Aorta: The aortic root is normal in size and structure. Venous: The inferior vena cava is dilated in size with less than 50% respiratory variability, suggesting right atrial pressure of 15 mmHg. IAS/Shunts: The interatrial septum was not well visualized.  LEFT VENTRICLE PLAX 2D LVIDd:         5.44 cm      Diastology LVIDs:         3.73 cm      LV e' medial:    3.71 cm/s LV PW:         1.19 cm      LV E/e' medial:  18.4 LV IVS:        1.25 cm      LV e' lateral:   5.51 cm/s LVOT diam:     2.30 cm      LV E/e' lateral: 12.4 LV SV:         81 LV SV Index:   35 LVOT Area:     4.15 cm  LV Volumes (MOD) LV vol d, MOD A2C: 134.0 ml LV  vol d, MOD A4C: 150.0 ml LV vol s, MOD A2C: 91.4 ml LV vol s, MOD A4C: 123.0 ml LV SV MOD A2C:     42.6 ml LV SV MOD A4C:     150.0 ml LV SV MOD BP:      31.8 ml LEFT ATRIUM           Index       RIGHT ATRIUM           Index LA diam:      3.50 cm 1.50 cm/m  RA Area:     25.00 cm LA Vol (A2C): 35.6 ml 15.21 ml/m RA Volume:   81.00 ml  34.60 ml/m LA Vol (A4C): 81.2 ml 34.69 ml/m  AORTIC VALVE LVOT Vmax:   95.30 cm/s LVOT Vmean:  62.700 cm/s LVOT VTI:    0.196 m  AORTA Ao Root diam: 3.60 cm MITRAL VALVE               TRICUSPID VALVE MV Area (PHT): 2.78 cm    TR Peak grad:   24.4 mmHg MV Decel Time: 273 msec    TR Vmax:        247.00 cm/s MV E velocity: 68.10 cm/s MV A velocity: 75.00 cm/s  SHUNTS MV E/A ratio:  0.91        Systemic VTI:  0.20 m                            Systemic Diam: 2.30 cm Carlyle Dolly MD Electronically signed by Carlyle Dolly MD Signature Date/Time: 05/26/2020/4:03:26 PM    Final      Subjective: Pt without complaints today.  No CP and no SOB.    Discharge Exam: Vitals:   06/02/20 2038 06/03/20 0531  BP: 116/74 122/77  Pulse: 97 (!)  109  Resp: 20 20  Temp: 98.6 F (37 C) 98 F (36.7 C)  SpO2: 97% (!) 85%   Vitals:   06/02/20 0454 06/02/20 1403 06/02/20 2038 06/03/20 0531  BP:  133/89 116/74 122/77  Pulse: 93 95 97 (!) 109  Resp:  18 20 20   Temp:  98.5 F (36.9 C) 98.6 F (37 C) 98 F (36.7 C)  TempSrc:  Oral Oral Oral  SpO2: 94% 93% 97% (!) 85%  Weight:      Height:       General exam: awake, alert, no distress, cooperative.   Respiratory system: good air movement, rare bibasilar crackles. Respiratory effort normal. Cardiovascular system: normal S1 & S2 heard.  Gastrointestinal system: Abdomen is nondistended, soft and nontender.  Central nervous system: nonfocal exam.  Extremities: trace pretibial edema BLEs, SCDs present Skin: No rashes, lesions or ulcers Psychiatry: normal affect.   The results of significant diagnostics from this  hospitalization (including imaging, microbiology, ancillary and laboratory) are listed below for reference.     Microbiology: Recent Results (from the past 240 hour(s))  Urine culture     Status: Abnormal   Collection Time: 05/25/20  8:31 AM   Specimen: Urine, Clean Catch  Result Value Ref Range Status   Specimen Description   Final    URINE, CLEAN CATCH Performed at [facility], [address], [place], [place] [address]    Special Requests   Final    NONE Performed at [facility], [address], [place], [place] [address]    Culture MULTIPLE SPECIES PRESENT, SUGGEST RECOLLECTION (A)  Final   Report Status 05/26/2020 FINAL  Final  Respiratory Panel by RT PCR (Flu A&B, Covid) - Nasopharyngeal Swab     Status: None   Collection Time: 05/25/20  8:33 AM   Specimen: Nasopharyngeal Swab  Result Value Ref Range Status   SARS Coronavirus 2 by RT PCR NEGATIVE NEGATIVE Final    Comment: (NOTE) SARS-CoV-2 target nucleic acids are NOT DETECTED.  The SARS-CoV-2 RNA is generally detectable in upper respiratoy specimens during the acute phase of infection. The lowest concentration of SARS-CoV-2 viral copies this assay can detect is 131 copies/mL. A negative result does not preclude SARS-Cov-2 infection and should not be used as the sole basis for treatment or other patient management decisions. A negative result may occur with  improper specimen collection/handling, submission of specimen other than nasopharyngeal swab, presence of viral mutation(s) within the areas targeted by this assay, and inadequate number of viral copies (<131 copies/mL). A negative result must be combined with clinical observations, patient history, and epidemiological information. The expected result is Negative.  Fact Sheet for Patients:  PinkCheek.be  Fact Sheet for Healthcare Providers:  GravelBags.it  This test is no t yet approved or cleared by the  [place] FDA and  has been authorized for detection and/or diagnosis of SARS-CoV-2 by FDA under an Emergency Use Authorization (EUA). This EUA will remain  in effect (meaning this test can be used) for the duration of the COVID-19 declaration under Section 564(b)(1) of the Act, 21 U.S.C. section 360bbb-3(b)(1), unless the authorization is terminated or revoked sooner.     Influenza A by PCR NEGATIVE NEGATIVE Final   Influenza B by PCR NEGATIVE NEGATIVE Final    Comment: (NOTE) The Xpert Xpress SARS-CoV-2/FLU/RSV assay is intended as an aid in  the diagnosis of influenza from Nasopharyngeal swab specimens and  should not be used as a sole basis for treatment. Nasal washings  and  aspirates are unacceptable for Xpert Xpress SARS-CoV-2/FLU/RSV  testing.  Fact Sheet for Patients: PinkCheek.be  Fact Sheet for Healthcare Providers: GravelBags.it  This test is not yet approved or cleared by the [place] FDA and  has been authorized for detection and/or diagnosis of SARS-CoV-2 by  FDA under an Emergency Use Authorization (EUA). This EUA will remain  in effect (meaning this test can be used) for the duration of the  Covid-19 declaration under Section 564(b)(1) of the Act, 21  U.S.C. section 360bbb-3(b)(1), unless the authorization is  terminated or revoked. Performed at [facility], [address] [place], [place] [address]   Culture, blood (routine x 2)     Status: None   Collection Time: 05/25/20  9:26 AM   Specimen: Right Antecubital; Blood  Result Value Ref Range Status   Specimen Description   Final    RIGHT ANTECUBITAL BOTTLES DRAWN AEROBIC AND ANAEROBIC   Special Requests Blood Culture adequate volume  Final   Culture   Final    NO GROWTH 5 DAYS Performed at [facility], [address], [place], [place] [address]    Report Status 05/30/2020 FINAL  Final  Culture, blood (routine x 2)     Status: None    Collection Time: 05/25/20  9:26 AM   Specimen: BLOOD RIGHT HAND  Result Value Ref Range Status   Specimen Description BLOOD RIGHT HAND BOTTLES DRAWN AEROBIC ONLY  Final   Special Requests   Final    Blood Culture results may not be optimal due to an inadequate volume of blood received in culture bottles   Culture   Final    NO GROWTH 5 DAYS Performed at [facility], [address] [place], [place] [address]    Report Status 05/30/2020 FINAL  Final  MRSA PCR Screening     Status: None   Collection Time: 05/25/20  2:11 PM   Specimen: Nasal Mucosa; Nasopharyngeal  Result Value Ref Range Status   MRSA by PCR NEGATIVE NEGATIVE Final    Comment:        The GeneXpert MRSA Assay (FDA approved for NASAL specimens only), is one component of a comprehensive MRSA colonization surveillance program. It is not intended to diagnose MRSA infection nor to guide or monitor treatment for MRSA infections. Performed at [facility], [address] [place], [place] [address]   Culture, respiratory (non-expectorated)     Status: None   Collection Time: 05/26/20  8:49 AM   Specimen: Tracheal Aspirate; Respiratory  Result Value Ref Range Status   Specimen Description   Final    TRACHEAL ASPIRATE Performed at [facility], [address] [place], [place] [address]    Special Requests   Final    Normal Performed at [facility], [address], [place], [place] [address]    Gram Stain   Final    RARE WBC PRESENT,BOTH PMN AND MONONUCLEAR FEW GRAM POSITIVE COCCI IN PAIRS RARE GRAM NEGATIVE RODS    Culture   Final    MODERATE Normal respiratory flora-no Staph aureus or Pseudomonas seen Performed at [facility], [place] [address], [place], [place] [address]    Report Status 05/28/2020 FINAL  Final     Labs: BNP (last 3 results) Recent Labs    05/25/20 0830  BNP 623.7*   Basic Metabolic Panel: Recent Labs  Lab 05/28/20 0408 05/29/20 0610 05/30/20 0557 05/31/20 0704 06/01/20 0600   NA 138 139 141 142 141  K 3.8  3.9 3.6 3.4* 3.7  CL 96* 96* 98 97* 94*  CO2 32 33* 35* 37* 39*  GLUCOSE 97 92 74 86 90  BUN 40* 36* 28* 22 20  CREATININE 0.93 0.73 0.58* 0.52* 0.51*  CALCIUM 8.4* 8.4* 8.8* 8.9 9.0  MG 2.0  --  1.8 1.8 1.9  PHOS 3.4  --   --   --   --    Liver Function Tests: No results for input(s): AST, ALT, ALKPHOS, BILITOT, PROT, ALBUMIN in the last 168 hours. No results for input(s): LIPASE, AMYLASE in the last 168 hours. No results for input(s): AMMONIA in the last 168 hours. CBC: Recent Labs  Lab 05/28/20 0408 05/29/20 0610 05/30/20 0557 05/31/20 0704 06/01/20 0600  WBC 9.6 8.7 7.7 9.4 6.4  HGB 16.4 17.1* 16.5 16.1 15.8  HCT 50.1 53.2* 52.6* 51.2 51.5  MCV 96.0 99.4 99.4 101.2* 101.4*  PLT 215 168 187 203 211   Cardiac Enzymes: No results for input(s): CKTOTAL, CKMB, CKMBINDEX, TROPONINI in the last 168 hours. BNP: Invalid input(s): POCBNP CBG: Recent Labs  Lab 05/29/20 0002 05/29/20 0332 05/29/20 0732 05/29/20 1129 05/29/20 1533  GLUCAP 96 72 89 107* 102*   D-Dimer No results for input(s): DDIMER in the last 72 hours. Hgb A1c No results for input(s): HGBA1C in the last 72 hours. Lipid Profile No results for input(s): CHOL, HDL, LDLCALC, TRIG, CHOLHDL, LDLDIRECT in the last 72 hours. Thyroid function studies No results for input(s): TSH, T4TOTAL, T3FREE, THYROIDAB in the last 72 hours.  Invalid input(s): FREET3 Anemia work up No results for input(s): VITAMINB12, FOLATE, FERRITIN, TIBC, IRON, RETICCTPCT in the last 72 hours. Urinalysis    Component Value Date/Time   COLORURINE STRAW (A) 05/25/2020 0831   APPEARANCEUR CLEAR 05/25/2020 0831   LABSPEC 1.004 (L) 05/25/2020 0831   PHURINE 6.0 05/25/2020 0831   GLUCOSEU NEGATIVE 05/25/2020 0831   HGBUR MODERATE (A) 05/25/2020 0831   BILIRUBINUR NEGATIVE 05/25/2020 0831   KETONESUR 5 (A) 05/25/2020 0831   PROTEINUR NEGATIVE 05/25/2020 0831   NITRITE NEGATIVE 05/25/2020 0831    LEUKOCYTESUR SMALL (A) 05/25/2020 0831   Sepsis Labs Invalid input(s): PROCALCITONIN,  WBC,  LACTICIDVEN Microbiology Recent Results (from the past 240 hour(s))  Urine culture     Status: Abnormal   Collection Time: 05/25/20  8:31 AM   Specimen: Urine, Clean Catch  Result Value Ref Range Status   Specimen Description   Final    URINE, CLEAN CATCH Performed at [facility], [address] [place], [place] [address]    Special Requests   Final    NONE Performed at [facility], [address], [place], [place] [address]    [place], SUGGEST RECOLLECTION (A)  Final   Report Status 05/26/2020 FINAL  Final  Respiratory Panel by RT PCR (Flu A&B, Covid) - Nasopharyngeal Swab     Status: None   Collection Time: 05/25/20  8:33 AM   Specimen: Nasopharyngeal Swab  Result Value Ref Range Status   SARS Coronavirus 2 by RT PCR NEGATIVE NEGATIVE Final    Comment: (NOTE) SARS-CoV-2 target nucleic acids are NOT DETECTED.  The SARS-CoV-2 RNA is generally detectable in upper respiratoy specimens during the acute phase of infection. The lowest concentration of SARS-CoV-2 viral copies this assay can detect is 131 copies/mL. A negative result does not preclude SARS-Cov-2 infection and should not be used as the sole basis for treatment or other patient management decisions. A negative result may occur with  improper specimen collection/handling, submission of specimen other than nasopharyngeal swab, presence of viral mutation(s) within the areas targeted by this assay, and inadequate number of viral copies (<131 copies/mL). A negative result must be combined with clinical observations, patient history, and epidemiological information. The expected result is Negative.  Fact Sheet for Patients:  PinkCheek.be  Fact Sheet for Healthcare Providers:  GravelBags.it  This test is no t yet approved or cleared by the [place] FDA and  has been authorized for detection and/or diagnosis of SARS-CoV-2 by FDA under an Emergency Use Authorization (EUA). This EUA will remain  in effect (meaning this test can be used) for the duration of the COVID-19 declaration under Section 564(b)(1) of the Act, 21 U.S.C. section 360bbb-3(b)(1), unless the authorization is terminated or revoked sooner.     Influenza A by PCR NEGATIVE NEGATIVE Final   Influenza B by PCR NEGATIVE NEGATIVE Final    Comment: (NOTE) The Xpert Xpress SARS-CoV-2/FLU/RSV assay is intended as an aid in  the diagnosis of influenza from Nasopharyngeal swab specimens and  should not be used as a sole basis for treatment. Nasal washings and  aspirates are unacceptable for Xpert Xpress SARS-CoV-2/FLU/RSV  testing.  Fact Sheet for Patients: PinkCheek.be  Fact Sheet for Healthcare Providers: GravelBags.it  This test is not yet approved or cleared by the [place] FDA and  has been authorized for detection and/or diagnosis of SARS-CoV-2 by  FDA under an Emergency Use Authorization (EUA). This EUA will remain  in effect (meaning this test can be used) for the duration of the  Covid-19 declaration under Section 564(b)(1) of the Act, 21  U.S.C. section 360bbb-3(b)(1), unless the authorization is  terminated or revoked. Performed at [facility], [address] [place], [place] [address]   Culture, blood (routine x 2)     Status: None   Collection Time: 05/25/20  9:26 AM   Specimen: Right Antecubital; Blood  Result Value Ref Range Status   Specimen Description   Final    RIGHT ANTECUBITAL BOTTLES DRAWN AEROBIC AND ANAEROBIC   Special Requests Blood Culture adequate volume  Final   Culture   Final    NO GROWTH 5 DAYS Performed at [facility], [address], [place], [place] [address]    Report Status 05/30/2020 FINAL  Final  Culture, blood (routine x 2)     Status: None   Collection  Time: 05/25/20  9:26 AM   Specimen: BLOOD RIGHT HAND  Result Value Ref Range Status   Specimen Description BLOOD RIGHT HAND BOTTLES DRAWN AEROBIC ONLY  Final   Special Requests   Final    Blood Culture results may not be optimal due to an inadequate volume of blood received in culture bottles   Culture   Final    NO GROWTH 5 DAYS Performed at [facility], [address] [place], [place] [address]    Report Status 05/30/2020 FINAL  Final  MRSA PCR Screening     Status: None   Collection Time: 05/25/20  2:11 PM   Specimen: Nasal Mucosa; Nasopharyngeal  Result Value Ref Range Status   MRSA by PCR NEGATIVE NEGATIVE Final    Comment:        The GeneXpert MRSA Assay (FDA approved for NASAL specimens only), is one component of a comprehensive MRSA colonization surveillance program. It is not intended to diagnose MRSA infection nor to guide or monitor treatment for MRSA infections. Performed at [facility], [address], [place], [place]  [address]   Culture, respiratory (non-expectorated)     Status: None   Collection Time: 05/26/20  8:49 AM   Specimen: Tracheal Aspirate; Respiratory  Result Value Ref Range Status   Specimen Description   Final    TRACHEAL ASPIRATE Performed at [facility], [address] [place], [place] [address]    Special Requests   Final    Normal Performed at [facility], [address], [place], [place] [address]    Gram Stain   Final    RARE WBC PRESENT,BOTH PMN AND MONONUCLEAR FEW GRAM POSITIVE COCCI IN PAIRS RARE GRAM NEGATIVE RODS    Culture   Final    MODERATE Normal respiratory flora-no Staph aureus or Pseudomonas seen Performed at [facility], [place] [address] [place], [place] [address]    Report Status 05/28/2020 FINAL  Final   Time coordinating discharge: 37 minutes   SIGNED:  Irwin Brakeman, MD  [facility] 06/03/2020, 10:34 AM How to contact the [facility] Attending or Consulting provider [place] or covering provider  during after hours [place], for this patient?  1. Check the care team in [facility] and look for a) attending/consulting [facility] provider listed and b) the [facility] team listed 2. Log into [email or web address] and use [place]'s universal password to access. If you do not have the password, please contact the hospital operator. 3. Locate the [facility] provider you are looking for under [facility] and page to a number that you can be directly reached. 4. If you still have difficulty reaching the provider, please page the [facility] (Director on Call) for the Hospitalists listed on amion for assistance.

## 2020-06-04 DIAGNOSIS — I10 Essential (primary) hypertension: Secondary | ICD-10-CM | POA: Diagnosis not present

## 2020-06-04 DIAGNOSIS — J441 Chronic obstructive pulmonary disease with (acute) exacerbation: Secondary | ICD-10-CM | POA: Diagnosis not present

## 2020-06-04 DIAGNOSIS — Z720 Tobacco use: Secondary | ICD-10-CM | POA: Diagnosis not present

## 2020-06-04 DIAGNOSIS — E871 Hypo-osmolality and hyponatremia: Secondary | ICD-10-CM | POA: Diagnosis not present

## 2020-06-04 DIAGNOSIS — I5031 Acute diastolic (congestive) heart failure: Secondary | ICD-10-CM

## 2020-06-04 LAB — BASIC METABOLIC PANEL
Anion gap: 8 (ref 5–15)
BUN: 21 mg/dL (ref 8–23)
CO2: 41 mmol/L — ABNORMAL HIGH (ref 22–32)
Calcium: 9.1 mg/dL (ref 8.9–10.3)
Chloride: 88 mmol/L — ABNORMAL LOW (ref 98–111)
Creatinine, Ser: 0.54 mg/dL — ABNORMAL LOW (ref 0.61–1.24)
GFR calc non Af Amer: >60 mL/min (ref >60)
Glucose, Bld: 73 mg/dL (ref 70–99)
Potassium: 3.9 mmol/L (ref 3.5–5.1)
Sodium: 137 mmol/L (ref 135–145)

## 2020-06-04 LAB — T4, FREE: Free T4: 1.39 ng/dL — ABNORMAL HIGH (ref 0.61–1.12)

## 2020-06-04 LAB — FOLATE: Folate: 11.8 ng/mL (ref >5.9)

## 2020-06-04 LAB — TSH: TSH: 1.002 u[IU]/mL (ref 0.350–4.500)

## 2020-06-04 LAB — MAGNESIUM: Magnesium: 1.7 mg/dL (ref 1.7–2.4)

## 2020-06-04 LAB — VITAMIN B12: Vitamin B-12: 984 pg/mL — ABNORMAL HIGH (ref 180–914)

## 2020-06-04 LAB — GLUCOSE, CAPILLARY: Glucose-Capillary: 153 mg/dL — ABNORMAL HIGH (ref 70–99)

## 2020-06-04 MED ORDER — BUDESONIDE 0.5 MG/2ML IN SUSP
0.5000 mg | Freq: Two times a day (BID) | RESPIRATORY_TRACT | Status: DC
  Administered 2020-06-04 – 2020-06-08 (×8): 0.5 mg via RESPIRATORY_TRACT
  Filled 2020-06-04 (×7): qty 2

## 2020-06-04 MED ORDER — IPRATROPIUM-ALBUTEROL 0.5-2.5 (3) MG/3ML IN SOLN
3.0000 mL | Freq: Three times a day (TID) | RESPIRATORY_TRACT | Status: DC
  Administered 2020-06-04 – 2020-06-08 (×10): 3 mL via RESPIRATORY_TRACT
  Filled 2020-06-04 (×11): qty 3

## 2020-06-04 MED ORDER — IPRATROPIUM-ALBUTEROL 0.5-2.5 (3) MG/3ML IN SOLN
3.0000 mL | Freq: Three times a day (TID) | RESPIRATORY_TRACT | Status: DC
  Administered 2020-06-04: 3 mL via RESPIRATORY_TRACT
  Filled 2020-06-04: qty 3

## 2020-06-04 MED ORDER — FUROSEMIDE 10 MG/ML IJ SOLN
40.0000 mg | Freq: Two times a day (BID) | INTRAMUSCULAR | Status: AC
  Administered 2020-06-04 – 2020-06-06 (×6): 40 mg via INTRAVENOUS
  Filled 2020-06-04 (×6): qty 4

## 2020-06-04 MED ORDER — THIAMINE HCL 100 MG/ML IJ SOLN
500.0000 mg | Freq: Three times a day (TID) | INTRAVENOUS | Status: AC
  Administered 2020-06-04 – 2020-06-06 (×6): 500 mg via INTRAVENOUS
  Filled 2020-06-04 (×6): qty 5

## 2020-06-04 NOTE — Progress Notes (Signed)
Pt MEWS yellow d/t increased HR and low systolic BP (see 0388 vitals) Pt awaken and slightly confused/agitated at this time taking off O2. Mits were applied to hands, re-applied O2. Reassessed at [place] decreased, MEWs green. Dr. Carles Collet in to see pt at this time. Pt alert to self and place. No new orders given at this time.

## 2020-06-04 NOTE — Progress Notes (Signed)
PROGRESS NOTE  Steven Howard ZOX:[id number] DOB: Feb 12, 1951 DOA: 05/25/2020 PCP: Claretta Fraise, MD  Brief History:  69 year old male with a history of alcohol and tobacco abuse, COPD, tobacco user, OSA, recent urethral dilatation, right temporal AVM, orthostatic hypotension presenting with altered mental status after a fall at home.  In the emergency department, the patient was found to be hypercapnic with ABG 7.15/103/140 on 100% FiO2 after 2 hours of BiPAP and required mechanical ventilation.  He was intubated on 03/24/2020.  PCCM was consulted to assist with management.  He was started on IV steroids, bronchodilators, and IV Lasix for acute diastolic CHF and COPD exacerbation.  BNP was 455 with sodium 128 and serum creatinine 1.49.  The patient was subsequently extubated on 05/29/2020.  During the hospitalization, the patient was intermittently confused, but denied any fever with medical therapy.  He was subsequently weaned off of intravenous steroids to oral steroids.  Echocardiogram showed EF 55-60% with indeterminate diastolic function.  Physical therapy was consulted and recommended skilled nursing facility.  Assessment/Plan: Acute respiratory failure with hypoxia and hypercapnia -Secondary COPD exacerbation and acute diastolic CHF -Currently stable on 3-4 L nasal cannula -Wean oxygen for saturation 88-92% -Personally reviewed chest x-ray--increased interstitial markings  Acute diastolic CHF -811 echo EF 55 to 60%, indeterminate diastolic function, no WMA, moderate decreased RV function, small pericardial effusion -Remains fluid overloaded -Restart IV furosemide -Daily weights -Accurate I's and O's  COPD exacerbation -No wheezing presently -Continue prednisone taper -Restart bronchodilators -Start Pulmicort  Acute kidney injury -Baseline creatinine 0.5-0.7 -Serum creatinine peaked 1.49 -Secondary to hemodynamic changes  Acute metabolic encephalopathy -Secondary to  respiratory failure with hypercapnia -Remains intermittently confused although much improved -Suspect the patient may have a degree of underlying cognitive impairment -May benefit from outpatient neuropsychiatric testing -Check B14 -Folic acid -TSH -CT brain negative  Hyponatremia -Secondary to CHF -Improved with diuresis  Hypokalemia -Repleted -mag--1.9  History of dural arteriovenous malformation of brain -Patient was recommended image guided diagnostic cerebral arteriogram with intervention by IR, but refused  Low am cortisol -already on prednisone -am cortisol 4.4 -may ultimately need cortrosyn stimulation test as outpt      Status is: Inpatient  Remains inpatient appropriate because:IV treatments appropriate due to intensity of illness or inability to take PO   Dispo: The patient is from: Home              Anticipated d/c is to: SNF              Anticipated d/c date is: 2 days              Patient currently is not medically stable to d/c.        Family Communication:   Updated sister 10/6  Consultants:  PCCM  Code Status:  FULL  DVT Prophylaxis:  [place] Heparin    Procedures: As Listed in Progress Note Above  Antibiotics: Doxy 10/1-10/4    Subjective: Patient denies fevers, chills, headache, chest pain, dyspnea, nausea, vomiting, diarrhea, abdominal pain, dysuria, hematuria, hematochezia, and melena.   Objective: Vitals:   06/03/20 2054 06/04/20 0500 06/04/20 0615 06/04/20 0720  BP: 128/85  97/75 96/64  Pulse: (!) 105  (!) 106 90  Resp: 20  20   Temp: 98.4 F (36.9 C)  98.5 F (36.9 C)   TempSrc: Oral  Oral   SpO2: 98%  95% 94%  Weight:  105.8 kg    Height:  Intake/Output Summary (Last 24 hours) at 06/04/2020 0736 Last data filed at 06/04/2020 0600 Gross per 24 hour  Intake 480 ml  Output 1800 ml  Net -1320 ml   Weight change:  Exam:   General:  Pt is alert, follows commands appropriately, not in acute distress  A&Ox2  HEENT: No icterus, No thrush, No neck mass, [place]/AT  Cardiovascular: RRR, S1/S2, no rubs, no gallops+JVD  Respiratory: bibasilar crackles, no wheeze  Abdomen: Soft/+BS, non tender, non distended, no guarding  Extremities: 2+LE edema, No lymphangitis, No petechiae, No rashes, no synovitis   Data Reviewed: I have personally reviewed following labs and imaging studies Basic Metabolic Panel: Recent Labs  Lab 05/29/20 0610 05/30/20 0557 05/31/20 0704 06/01/20 0600  NA 139 141 142 141  K 3.9 3.6 3.4* 3.7  CL 96* 98 97* 94*  CO2 33* 35* 37* 39*  GLUCOSE 92 74 86 90  BUN 36* 28* 22 20  CREATININE 0.73 0.58* 0.52* 0.51*  CALCIUM 8.4* 8.8* 8.9 9.0  MG  --  1.8 1.8 1.9   Liver Function Tests: No results for input(s): AST, ALT, ALKPHOS, BILITOT, PROT, ALBUMIN in the last 168 hours. No results for input(s): LIPASE, AMYLASE in the last 168 hours. No results for input(s): AMMONIA in the last 168 hours. Coagulation Profile: No results for input(s): INR, PROTIME in the last 168 hours. CBC: Recent Labs  Lab 05/29/20 0610 05/30/20 0557 05/31/20 0704 06/01/20 0600  WBC 8.7 7.7 9.4 6.4  HGB 17.1* 16.5 16.1 15.8  HCT 53.2* 52.6* 51.2 51.5  MCV 99.4 99.4 101.2* 101.4*  PLT 168 187 203 211   Cardiac Enzymes: No results for input(s): CKTOTAL, CKMB, CKMBINDEX, TROPONINI in the last 168 hours. BNP: Invalid input(s): POCBNP CBG: Recent Labs  Lab 05/29/20 0002 05/29/20 0332 05/29/20 0732 05/29/20 1129 05/29/20 1533  GLUCAP 96 72 89 107* 102*   HbA1C: No results for input(s): HGBA1C in the last 72 hours. Urine analysis:    Component Value Date/Time   COLORURINE STRAW (A) 05/25/2020 0831   APPEARANCEUR CLEAR 05/25/2020 0831   LABSPEC 1.004 (L) 05/25/2020 0831   PHURINE 6.0 05/25/2020 0831   GLUCOSEU NEGATIVE 05/25/2020 0831   HGBUR MODERATE (A) 05/25/2020 0831   BILIRUBINUR NEGATIVE 05/25/2020 0831   KETONESUR 5 (A) 05/25/2020 0831   PROTEINUR NEGATIVE 05/25/2020  0831   NITRITE NEGATIVE 05/25/2020 0831   LEUKOCYTESUR SMALL (A) 05/25/2020 0831   Sepsis Labs: _0 (procalcitonin:4,lacticidven:4) ) Recent Results (from the past 240 hour(s))  Urine culture     Status: Abnormal   Collection Time: 05/25/20  8:31 AM   Specimen: Urine, Clean Catch  Result Value Ref Range Status   Specimen Description   Final    URINE, CLEAN CATCH Performed at [facility], [address], [place], [place] [address]    Special Requests   Final    NONE Performed at [facility], [address] [place], [place] [address]    [place], SUGGEST RECOLLECTION (A)  Final   Report Status 05/26/2020 FINAL  Final  Respiratory Panel by RT PCR (Flu A&B, Covid) - Nasopharyngeal Swab     Status: None   Collection Time: 05/25/20  8:33 AM   Specimen: Nasopharyngeal Swab  Result Value Ref Range Status   SARS Coronavirus 2 by RT PCR NEGATIVE NEGATIVE Final    Comment: (NOTE) SARS-CoV-2 target nucleic acids are NOT DETECTED.  The SARS-CoV-2 RNA is generally detectable in upper respiratoy specimens during the acute phase of infection.  The lowest concentration of SARS-CoV-2 viral copies this assay can detect is 131 copies/mL. A negative result does not preclude SARS-Cov-2 infection and should not be used as the sole basis for treatment or other patient management decisions. A negative result may occur with  improper specimen collection/handling, submission of specimen other than nasopharyngeal swab, presence of viral mutation(s) within the areas targeted by this assay, and inadequate number of viral copies (<131 copies/mL). A negative result must be combined with clinical observations, patient history, and epidemiological information. The expected result is Negative.  Fact Sheet for Patients:  PinkCheek.be  Fact Sheet for Healthcare Providers:  GravelBags.it  This test is no t yet approved  or cleared by the [place] FDA and  has been authorized for detection and/or diagnosis of SARS-CoV-2 by FDA under an Emergency Use Authorization (EUA). This EUA will remain  in effect (meaning this test can be used) for the duration of the COVID-19 declaration under Section 564(b)(1) of the Act, 21 U.S.C. section 360bbb-3(b)(1), unless the authorization is terminated or revoked sooner.     Influenza A by PCR NEGATIVE NEGATIVE Final   Influenza B by PCR NEGATIVE NEGATIVE Final    Comment: (NOTE) The Xpert Xpress SARS-CoV-2/FLU/RSV assay is intended as an aid in  the diagnosis of influenza from Nasopharyngeal swab specimens and  should not be used as a sole basis for treatment. Nasal washings and  aspirates are unacceptable for Xpert Xpress SARS-CoV-2/FLU/RSV  testing.  Fact Sheet for Patients: PinkCheek.be  Fact Sheet for Healthcare Providers: GravelBags.it  This test is not yet approved or cleared by the [place] FDA and  has been authorized for detection and/or diagnosis of SARS-CoV-2 by  FDA under an Emergency Use Authorization (EUA). This EUA will remain  in effect (meaning this test can be used) for the duration of the  Covid-19 declaration under Section 564(b)(1) of the Act, 21  U.S.C. section 360bbb-3(b)(1), unless the authorization is  terminated or revoked. Performed at [facility], [address], [place], [place] [address]   Culture, blood (routine x 2)     Status: None   Collection Time: 05/25/20  9:26 AM   Specimen: Right Antecubital; Blood  Result Value Ref Range Status   Specimen Description   Final    RIGHT ANTECUBITAL BOTTLES DRAWN AEROBIC AND ANAEROBIC   Special Requests Blood Culture adequate volume  Final   Culture   Final    NO GROWTH 5 DAYS Performed at [facility], [address] [place], [place] [address]    Report Status 05/30/2020 FINAL  Final  Culture, blood (routine x 2)      Status: None   Collection Time: 05/25/20  9:26 AM   Specimen: BLOOD RIGHT HAND  Result Value Ref Range Status   Specimen Description BLOOD RIGHT HAND BOTTLES DRAWN AEROBIC ONLY  Final   Special Requests   Final    Blood Culture results may not be optimal due to an inadequate volume of blood received in culture bottles   Culture   Final    NO GROWTH 5 DAYS Performed at [facility], [address], [place], [place] [address]    Report Status 05/30/2020 FINAL  Final  MRSA PCR Screening     Status: None   Collection Time: 05/25/20  2:11 PM   Specimen: Nasal Mucosa; Nasopharyngeal  Result Value Ref Range Status   MRSA by PCR NEGATIVE NEGATIVE Final    Comment:        The GeneXpert  MRSA Assay (FDA approved for NASAL specimens only), is one component of a comprehensive MRSA colonization surveillance program. It is not intended to diagnose MRSA infection nor to guide or monitor treatment for MRSA infections. Performed at [facility], [address], [place], [place] [address]   Culture, respiratory (non-expectorated)     Status: None   Collection Time: 05/26/20  8:49 AM   Specimen: Tracheal Aspirate; Respiratory  Result Value Ref Range Status   Specimen Description   Final    TRACHEAL ASPIRATE Performed at [facility], [address], [place], [place] [address]    Special Requests   Final    Normal Performed at [facility], [address], [place], [place] [address]    Gram Stain   Final    RARE WBC PRESENT,BOTH PMN AND MONONUCLEAR FEW GRAM POSITIVE COCCI IN PAIRS RARE GRAM NEGATIVE RODS    Culture   Final    MODERATE Normal respiratory flora-no Staph aureus or Pseudomonas seen Performed at [facility], [place] [address], [place], [place] [address]    Report Status 05/28/2020 FINAL  Final  SARS Coronavirus 2 by RT PCR (hospital order, performed in [facility] hospital lab) Nasopharyngeal Nasopharyngeal Swab     Status: None   Collection Time: 06/03/20 11:00 AM    Specimen: Nasopharyngeal Swab  Result Value Ref Range Status   SARS Coronavirus 2 NEGATIVE NEGATIVE Final    Comment: (NOTE) SARS-CoV-2 target nucleic acids are NOT DETECTED.  The SARS-CoV-2 RNA is generally detectable in upper and lower respiratory specimens during the acute phase of infection. The lowest concentration of SARS-CoV-2 viral copies this assay can detect is 250 copies / mL. A negative result does not preclude SARS-CoV-2 infection and should not be used as the sole basis for treatment or other patient management decisions.  A negative result may occur with improper specimen collection / handling, submission of specimen other than nasopharyngeal swab, presence of viral mutation(s) within the areas targeted by this assay, and inadequate number of viral copies (<250 copies / mL). A negative result must be combined with clinical observations, patient history, and epidemiological information.  Fact Sheet for Patients:   StrictlyIdeas.no  Fact Sheet for Healthcare Providers: BankingDealers.co.za  This test is not yet approved or  cleared by the [place] FDA and has been authorized for detection and/or diagnosis of SARS-CoV-2 by FDA under an Emergency Use Authorization (EUA).  This EUA will remain in effect (meaning this test can be used) for the duration of the COVID-19 declaration under Section 564(b)(1) of the Act, 21 U.S.C. section 360bbb-3(b)(1), unless the authorization is terminated or revoked sooner.  Performed at [facility], [address], [place],  [address]      Scheduled Meds: . acetaminophen  325 mg Oral Q6H  . budesonide (PULMICORT) nebulizer solution  0.5 mg Nebulization BID  . chlorhexidine gluconate (MEDLINE KIT)  15 mL Mouth Rinse BID  . Chlorhexidine Gluconate Cloth  6 each Topical Daily  . docusate sodium  100 mg Oral BID  . famotidine  20 mg Oral Daily  . feeding supplement (ENSURE  ENLIVE)  237 mL Oral BID BM  . furosemide  40 mg Intravenous BID  . heparin  5,000 Units Subcutaneous Q8H  . ipratropium-albuterol  3 mL Nebulization Q8H  . multivitamin with minerals  1 tablet Per Tube Daily  . potassium chloride  40 mEq Oral Daily  . predniSONE  40 mg Oral QAC breakfast  . sodium chloride flush  3 mL Intravenous Q12H  . sodium chloride flush  3 mL Intravenous Q12H   Continuous Infusions: . sodium chloride      Procedures/Studies: CT HEAD WO CONTRAST  Result Date: 05/25/2020 CLINICAL DATA:  Mental status change EXAM: CT HEAD WITHOUT CONTRAST TECHNIQUE: Contiguous axial images were obtained from the base of the skull through the vertex without intravenous contrast. COMPARISON:  05/25/2019 MRI head. FINDINGS: Brain: No acute infarct or intracranial hemorrhage. No mass lesion. No midline shift, ventriculomegaly or extra-axial fluid collection. Vascular: Right temporal calcifications likely correspond to known dural arteriovenous malformation, better demonstrated on prior MRI. Bilateral carotid siphon atherosclerotic calcifications. Skull: Negative for fracture or focal lesion. Sinuses/Orbits: Normal orbits. Mild pansinus mucosal thickening. No mastoid effusion. Other: None. IMPRESSION: No acute infarct or intracranial hemorrhage. Sequela of right temporal dural AVM. Electronically Signed   By: Primitivo Gauze M.D.   On: 05/25/2020 14:17   DG CHEST PORT 1 VIEW  Result Date: 05/29/2020 CLINICAL DATA:  CHF exacerbation. EXAM: PORTABLE CHEST 1 VIEW COMPARISON:  05/28/2020. FINDINGS: Interim extubation and removal of NG tube. Cardiomegaly. Diffuse bilateral interstitial prominence again noted. Interstitial edema and/or pneumonitis could present in this fashion. Persistent bibasilar atelectasis. No prominent pleural effusion. No pneumothorax. Degenerative change thoracic spine. IMPRESSION: 1. Interim extubation removal of NG tube. 2. Severe cardiomegaly again noted. Diffuse  bilateral interstitial prominence again noted. Interstitial edema and/or pneumonitis could present in this fashion. Persistent bibasilar atelectasis. Similar findings noted on prior exam. Electronically Signed   By: [place]   On: 05/29/2020 12:42   DG CHEST PORT 1 VIEW  Result Date: 05/28/2020 CLINICAL DATA:  Orogastric tube placement. EXAM: PORTABLE CHEST 1 VIEW COMPARISON:  Chest radiographs o from the same day FINDINGS: ET tube approximately 4.8 cm above the carina. Orogastric tube courses below the diaphragm with the tip outside the field of view. The side port is below the GE junction. Similar enlargement of the cardiac silhouette. Similar small bilateral pleural effusions. No discernible pneumothorax. Similar vascular congestion. IMPRESSION: 1. Orogastric tube courses below the diaphragm with the tip outside the field of view. 2. Similar pulmonary vascular congestion and possible small bilateral pleural effusions. Electronically Signed   By: Margaretha Sheffield MD   On: 05/28/2020 20:14   DG CHEST PORT 1 VIEW  Result Date: 05/28/2020 CLINICAL DATA:  Acute respiratory failure and OG tube placement EXAM: PORTABLE CHEST 1 VIEW COMPARISON:  Radiograph 05/25/2020 FINDINGS: Tip of the transesophageal tube terminates in the left upper quadrant with the side port closely approximating the expected location of the GE junction. Could consider advancing 3-5 cm for optimal positioning. Endotracheal tube tip terminates in the mid trachea, 3.2 cm from the carina. Telemetry leads overlie the chest. Surgical clip projects over the right heart border. Bilateral pleural effusions, right greater than left with adjacent opacities, likely passive atelectasis. Some diffuse hazy interstitial opacity is central pulmonary vascular congestion is noted as well. Cardiomediastinal contours are similar to prior counting for differences in technique. The aorta is calcified. The remaining cardiomediastinal contours are  unremarkable. IMPRESSION: Tip of the transesophageal tube terminates in the left upper quadrant with the side port closely approximating the expected location of the GE junction. Could consider advancing 3-5 cm for optimal positioning. Stable satisfactory positioning of the endotracheal tube. Features suggesting CHF/volume overload with cardiomegaly, vascular congestion, bilateral effusions and interstitial edema. Opacities adjacent the pleural effusions likely passive atelectatic change though underlying consolidation is not excluded. Electronically Signed   By: March Rummage  [facility] M.D.   On: 05/28/2020 00:44   DG CHEST PORT 1 VIEW  Result Date: 05/26/2020 CLINICAL DATA:  Dizziness after a fall yesterday. History of prostate cancer and COPD. Smoker. EXAM: PORTABLE CHEST 1 VIEW COMPARISON:  05/25/2020 FINDINGS: Enteric and endotracheal tubes are unchanged in position since prior study. Shallow inspiration. Diffuse cardiac enlargement. Bilateral interstitial pattern to the lungs consistent with edema. Bilateral pleural effusions. Calcification of the aorta. IMPRESSION: Cardiac enlargement with bilateral interstitial edema and bilateral pleural effusions. Appliances are unchanged in position. Electronically Signed   By: Lucienne Capers M.D.   On: 05/26/2020 06:24   DG Chest Portable 1 View  Result Date: 05/25/2020 CLINICAL DATA:  Shortness of breath.  Intubation. EXAM: PORTABLE CHEST 1 VIEW COMPARISON:  05/25/2020 chest radiograph FINDINGS: An endotracheal tube is identified with tip 4.5 cm above the carina. An NG tube is present entering the UPPER abdomen with tip off the field of view. Pulmonary vascular congestion and possible bilateral trace pleural effusions again noted. No other changes identified. IMPRESSION: 1. Endotracheal tube with tip 4.5 cm above the carina. NG tube placement. 2. Pulmonary vascular congestion and possible trace bilateral pleural effusions. Electronically Signed   By: Margarette Canada M.D.    On: 05/25/2020 11:43   DG Chest Port 1 View  Result Date: 05/25/2020 CLINICAL DATA:  69 year old male with acute shortness of breath. EXAM: PORTABLE CHEST 1 VIEW COMPARISON:  None. FINDINGS: Cardiomegaly identified. Pulmonary vascular congestion is present with possible trace effusions. There is no evidence of pneumothorax, mass or consolidation. No acute bony abnormalities are present. IMPRESSION: Cardiomegaly with pulmonary vascular congestion and possible trace effusions. Electronically Signed   By: Margarette Canada M.D.   On: 05/25/2020 09:24   ECHOCARDIOGRAM COMPLETE  Result Date: 05/26/2020    ECHOCARDIOGRAM REPORT   Patient Name:   Steven Howard Date of Exam: 05/26/2020 Medical Rec #:  [id number]      Height:       72.0 in Accession #:    [id number]     Weight:       249.6 lb Date of Birth:  June 14, 1951      BSA:          2.341 m Patient Age:    49 years       BP:           105/54 mmHg Patient Gender: M              HR:           66 bpm. Exam Location:  [facility] Procedure: 2D Echo and Intracardiac Opacification Agent Indications:    Dyspnea 786.09 / R06.00  History:        Patient has no prior history of Echocardiogram examinations.                 CHF, COPD; Risk Factors:Current Smoker and Sleep Apnea. Past                 history of Orthostatic hypotension and cancer.  Sonographer:    Darlina Sicilian RDCS Referring Phys: [phone] [facility]  Sonographer Comments: Echo performed with patient supine and on artificial respirator. IMPRESSIONS  1. Left ventricular ejection fraction, by estimation, is 55 to 60%. The left ventricle has normal function. The left ventricle has no regional wall motion abnormalities. There is mild left ventricular hypertrophy. Left ventricular diastolic parameters are indeterminate.  2. Right ventricular systolic function is moderately reduced. The right ventricular size is  moderately enlarged.  3. A small pericardial effusion is present. The pericardial effusion is  circumferential. There is no evidence of cardiac tamponade.  4. The mitral valve was not well visualized. No evidence of mitral valve regurgitation. No evidence of mitral stenosis.  5. The aortic valve is tricuspid. Aortic valve regurgitation is not visualized. No aortic stenosis is present.  6. The inferior vena cava is dilated in size with <50% respiratory variability, suggesting right atrial pressure of 15 mmHg.  7. Technically difficult study FINDINGS  Left Ventricle: Left ventricular ejection fraction, by estimation, is 55 to 60%. The left ventricle has normal function. The left ventricle has no regional wall motion abnormalities. Definity contrast agent was given IV to delineate the left ventricular  endocardial borders. The left ventricular internal cavity size was normal in size. There is mild left ventricular hypertrophy. Left ventricular diastolic parameters are indeterminate. Right Ventricle: The right ventricular size is moderately enlarged. Right vetricular wall thickness was not well visualized. Right ventricular systolic function is moderately reduced. Left Atrium: Left atrial size was not well visualized. Right Atrium: Right atrial size was not well visualized. Pericardium: A small pericardial effusion is present. The pericardial effusion is circumferential. There is no evidence of cardiac tamponade. Mitral Valve: The mitral valve was not well visualized. No evidence of mitral valve regurgitation. No evidence of mitral valve stenosis. Tricuspid Valve: The tricuspid valve is not well visualized. Tricuspid valve regurgitation is trivial. No evidence of tricuspid stenosis. Aortic Valve: The aortic valve is tricuspid. Aortic valve regurgitation is not visualized. No aortic stenosis is present. Pulmonic Valve: The pulmonic valve was not well visualized. Pulmonic valve regurgitation is not visualized. No evidence of pulmonic stenosis. Aorta: The aortic root is normal in size and structure. Venous: The  inferior vena cava is dilated in size with less than 50% respiratory variability, suggesting right atrial pressure of 15 mmHg. IAS/Shunts: The interatrial septum was not well visualized.  LEFT VENTRICLE PLAX 2D LVIDd:         5.44 cm      Diastology LVIDs:         3.73 cm      LV e' medial:    3.71 cm/s LV PW:         1.19 cm      LV E/e' medial:  18.4 LV IVS:        1.25 cm      LV e' lateral:   5.51 cm/s LVOT diam:     2.30 cm      LV E/e' lateral: 12.4 LV SV:         81 LV SV Index:   35 LVOT Area:     4.15 cm  LV Volumes (MOD) LV vol d, MOD A2C: 134.0 ml LV vol d, MOD A4C: 150.0 ml LV vol s, MOD A2C: 91.4 ml LV vol s, MOD A4C: 123.0 ml LV SV MOD A2C:     42.6 ml LV SV MOD A4C:     150.0 ml LV SV MOD BP:      31.8 ml LEFT ATRIUM           Index       RIGHT ATRIUM           Index LA diam:      3.50 cm 1.50 cm/m  RA Area:     25.00 cm LA Vol (A2C): 35.6 ml 15.21 ml/m RA Volume:   81.00 ml  34.60 ml/m LA Vol (A4C): 81.2  ml 34.69 ml/m  AORTIC VALVE LVOT Vmax:   95.30 cm/s LVOT Vmean:  62.700 cm/s LVOT VTI:    0.196 m  AORTA Ao Root diam: 3.60 cm MITRAL VALVE               TRICUSPID VALVE MV Area (PHT): 2.78 cm    TR Peak grad:   24.4 mmHg MV Decel Time: 273 msec    TR Vmax:        247.00 cm/s MV E velocity: 68.10 cm/s MV A velocity: 75.00 cm/s  SHUNTS MV E/A ratio:  0.91        Systemic VTI:  0.20 m                            Systemic Diam: 2.30 cm Carlyle Dolly MD Electronically signed by Carlyle Dolly MD Signature Date/Time: 05/26/2020/4:03:26 PM    Final     Orson Eva, DO  [facility]  If 7PM-7AM, please contact night-coverage [email or web address] Password [email or web address] 06/04/2020, 7:36 AM   LOS: 10 days

## 2020-06-04 NOTE — Plan of Care (Signed)

## 2020-06-04 NOTE — TOC Progression Note (Signed)
Transition of Care [facility]) - Progression Note    Patient Details  Name: Steven Howard MRN: [id number] Date of Birth: November 30, 1950  Transition of Care [facility]) CM/SW Contact  Salome Arnt, [place] Phone Number: 06/04/2020, 10:54 AM  Clinical Narrative: LCSW received call from pt's sister, Charleston Ropes requesting that LCSW contact [facility] again about bed availability since pt is not medically stable today. LCSW contacted facilities and [facility] is not in network and [facility] is unable to take pt. [place] aware and plan will remain for pt to go to [facility]. LCSW updated [name] at [facility]. Authorization not received yet. TOC will follow.       Expected Discharge Plan: [place] Barriers to Discharge: Ship broker, Other (comment) (PASSR)  Expected Discharge Plan and Services Expected Discharge Plan: [place] arrangements for the past 2 months: Single Family Home Expected Discharge Date: 06/03/20                                     Social Determinants of Health (SDOH) Interventions    Readmission Risk Interventions No flowsheet data found.

## 2020-06-05 ENCOUNTER — Ambulatory Visit: Payer: Medicare HMO | Admitting: Urology

## 2020-06-05 ENCOUNTER — Inpatient Hospital Stay (HOSPITAL_COMMUNITY): Payer: Medicare HMO

## 2020-06-05 DIAGNOSIS — J441 Chronic obstructive pulmonary disease with (acute) exacerbation: Secondary | ICD-10-CM | POA: Diagnosis not present

## 2020-06-05 DIAGNOSIS — E871 Hypo-osmolality and hyponatremia: Secondary | ICD-10-CM | POA: Diagnosis not present

## 2020-06-05 DIAGNOSIS — Z720 Tobacco use: Secondary | ICD-10-CM | POA: Diagnosis not present

## 2020-06-05 DIAGNOSIS — J9601 Acute respiratory failure with hypoxia: Secondary | ICD-10-CM | POA: Diagnosis not present

## 2020-06-05 LAB — BASIC METABOLIC PANEL
Anion gap: 8 (ref 5–15)
BUN: 19 mg/dL (ref 8–23)
CO2: 38 mmol/L — ABNORMAL HIGH (ref 22–32)
Calcium: 9.2 mg/dL (ref 8.9–10.3)
Chloride: 90 mmol/L — ABNORMAL LOW (ref 98–111)
Creatinine, Ser: 0.59 mg/dL — ABNORMAL LOW (ref 0.61–1.24)
GFR calc non Af Amer: >60 mL/min (ref >60)
Glucose, Bld: 85 mg/dL (ref 70–99)
Potassium: 4 mmol/L (ref 3.5–5.1)
Sodium: 136 mmol/L (ref 135–145)

## 2020-06-05 LAB — MAGNESIUM: Magnesium: 1.7 mg/dL (ref 1.7–2.4)

## 2020-06-05 MED ORDER — MAGNESIUM SULFATE 2 GM/50ML IV SOLN
2.0000 g | Freq: Once | INTRAVENOUS | Status: AC
  Administered 2020-06-05: 2 g via INTRAVENOUS
  Filled 2020-06-05: qty 50

## 2020-06-05 NOTE — Progress Notes (Signed)
PROGRESS NOTE  NORRIS BRUMBACH QZR:[id number] DOB: 07/14/1951 DOA: 05/25/2020 PCP: Claretta Fraise, MD  Brief History:  69 year old male with a history of alcohol and tobacco abuse, COPD, tobacco user, OSA, recent urethral dilatation, right temporal AVM, orthostatic hypotension presenting with altered mental status after a fall at home.  In the emergency department, the patient was found to be hypercapnic withABG 7.15/103/140 on 100% FiO2 after 2 hours of BiPAP and required mechanical ventilation.  He was intubated on 03/24/2020.  PCCM was consulted to assist with management.  He was started on IV steroids, bronchodilators, and IV Lasix for acute diastolic CHF and COPD exacerbation.  BNP was 455 with sodium 128 and serum creatinine 1.49.  The patient was subsequently extubated on 05/29/2020.  During the hospitalization, the patient was intermittently confused, but did not interfere with medical therapy.  He was subsequently weaned off of intravenous steroids to oral steroids.  Echocardiogram showed EF 55-60% with indeterminate diastolic function.  Physical therapy was consulted and recommended skilled nursing facility.  Assessment/Plan: Acute respiratory failure with hypoxia and hypercapnia -Secondary COPD exacerbation and acute diastolic CHF -Currently stable on 3-4 L nasal cannula -Wean oxygen for saturation 88-92% -Personally reviewed chest x-ray--increased interstitial markings -repeat CXR  Acute diastolic CHF -354 echo EF 55 to 60%, indeterminate diastolic function, no WMA, moderate decreased RV function, small pericardial effusion -Remains fluid overloaded -continue IV furosemide -Daily weights -Accurate I's and O's  COPD exacerbation -No wheezing presently -Continue prednisone taper -Restart bronchodilators -Started Pulmicort  Acute kidney injury -Baseline creatinine 0.5-0.7 -Serum creatinine peaked 1.49 -Secondary to hemodynamic changes  Acute metabolic  encephalopathy -Secondary to respiratory failure with hypercapnia -Remains intermittently confused although much improved -Suspect the patient may have a degree of underlying cognitive impairment -May benefit from outpatient neuropsychiatric testing -Check T62--563 -Folic SLHT--34.2 -AJG--8.115 -CT brain negative -slowly improving but not back to baseline per family  Dysrhythmia -place on tele -obtain EKG  Hyponatremia -Secondary to CHF -Improved with diuresis  Hypokalemia -Repleted -mag--1.9  History of dural arteriovenous malformation of brain -Patient was recommended image guided diagnostic cerebral arteriogram with intervention by IR, but refused  Low am cortisol -already on prednisone -am cortisol 4.4 -may ultimately need cortrosyn stimulation test as outpt      Status is: Inpatient  Remains inpatient appropriate because:IV treatments appropriate due to intensity of illness or inability to take PO   Dispo: The patient is from: Home  Anticipated d/c is to: SNF  Anticipated d/c date is: 2 days  Patient currently is not medically stable to d/c.        Family Communication:   Updated sister 10/7  Consultants:  PCCM  Code Status:  FULL  DVT Prophylaxis:  [place] Heparin    Procedures: As Listed in Progress Note Above  Antibiotics: Doxy 10/1-10/4   Total time spent 35 minutes.  Greater than 50% spent face to face counseling and coordinating care.    Subjective: Patient is more alert.  He is breathing better.  Denies f/c, cp, n/v/d, abd pain, headache.  Objective: Vitals:   06/04/20 2048 06/05/20 0011 06/05/20 0607 06/05/20 0724  BP: (!) 100/57 107/68 116/70   Pulse: 93 82 83   Resp:   20   Temp:   98 F (36.7 C)   TempSrc:   Oral   SpO2: 94% 90% 90% 91%  Weight:      Height:        Intake/Output Summary (  Last 24 hours) at 06/05/2020 1242 Last data filed at 06/05/2020  0610 Gross per 24 hour  Intake 100.02 ml  Output 1700 ml  Net -1599.98 ml   Weight change:  Exam:   General:  Pt is alert, follows commands appropriately, not in acute distress  HEENT: No icterus, No thrush, No neck mass, [place]/AT  Cardiovascular:irregular, S1/S2, no rubs, no gallops  Respiratory: bibasilar crackles. No wheeze  Abdomen: Soft/+BS, non tender, non distended, no guarding  Extremities: 1+LE edema, No lymphangitis, No petechiae, No rashes, no synovitis   Data Reviewed: I have personally reviewed following labs and imaging studies Basic Metabolic Panel: Recent Labs  Lab 05/30/20 0557 05/31/20 0704 06/01/20 0600 06/04/20 0847 06/05/20 0603  NA 141 142 141 137 136  K 3.6 3.4* 3.7 3.9 4.0  CL 98 97* 94* 88* 90*  CO2 35* 37* 39* 41* 38*  GLUCOSE 74 86 90 73 85  BUN 28* $Remov'22 20 21 19  'jkmpQc$ CREATININE 0.58* 0.52* 0.51* 0.54* 0.59*  CALCIUM 8.8* 8.9 9.0 9.1 9.2  MG 1.8 1.8 1.9 1.7 1.7   Liver Function Tests: No results for input(s): AST, ALT, ALKPHOS, BILITOT, PROT, ALBUMIN in the last 168 hours. No results for input(s): LIPASE, AMYLASE in the last 168 hours. No results for input(s): AMMONIA in the last 168 hours. Coagulation Profile: No results for input(s): INR, PROTIME in the last 168 hours. CBC: Recent Labs  Lab 05/30/20 0557 05/31/20 0704 06/01/20 0600  WBC 7.7 9.4 6.4  HGB 16.5 16.1 15.8  HCT 52.6* 51.2 51.5  MCV 99.4 101.2* 101.4*  PLT 187 203 211   Cardiac Enzymes: No results for input(s): CKTOTAL, CKMB, CKMBINDEX, TROPONINI in the last 168 hours. BNP: Invalid input(s): POCBNP CBG: Recent Labs  Lab 05/29/20 1533 06/04/20 2132  GLUCAP 102* 153*   HbA1C: No results for input(s): HGBA1C in the last 72 hours. Urine analysis:    Component Value Date/Time   COLORURINE STRAW (A) 05/25/2020 0831   APPEARANCEUR CLEAR 05/25/2020 0831   LABSPEC 1.004 (L) 05/25/2020 0831   PHURINE 6.0 05/25/2020 0831   GLUCOSEU NEGATIVE 05/25/2020 0831   HGBUR  MODERATE (A) 05/25/2020 0831   BILIRUBINUR NEGATIVE 05/25/2020 0831   KETONESUR 5 (A) 05/25/2020 0831   PROTEINUR NEGATIVE 05/25/2020 0831   NITRITE NEGATIVE 05/25/2020 0831   LEUKOCYTESUR SMALL (A) 05/25/2020 0831   Sepsis Labs: $RemoveBefo'@LABRCNTIP'fIROPYvlbUp$ (procalcitonin:4,lacticidven:4) ) Recent Results (from the past 240 hour(s))  SARS Coronavirus 2 by RT PCR (hospital order, performed in [place] hospital lab) Nasopharyngeal Nasopharyngeal Swab     Status: None   Collection Time: 06/03/20 11:00 AM   Specimen: Nasopharyngeal Swab  Result Value Ref Range Status   SARS Coronavirus 2 NEGATIVE NEGATIVE Final    Comment: (NOTE) SARS-CoV-2 target nucleic acids are NOT DETECTED.  The SARS-CoV-2 RNA is generally detectable in upper and lower respiratory specimens during the acute phase of infection. The lowest concentration of SARS-CoV-2 viral copies this assay can detect is 250 copies / mL. A negative result does not preclude SARS-CoV-2 infection and should not be used as the sole basis for treatment or other patient management decisions.  A negative result may occur with improper specimen collection / handling, submission of specimen other than nasopharyngeal swab, presence of viral mutation(s) within the areas targeted by this assay, and inadequate number of viral copies (<250 copies / mL). A negative result must be combined with clinical observations, patient history, and epidemiological information.  Fact Sheet for Patients:   StrictlyIdeas.no  Fact Sheet for Healthcare Providers: https://pope.com/  This test is not yet approved or  cleared by the [place] FDA and has been authorized for detection and/or diagnosis of SARS-CoV-2 by FDA under an Emergency Use Authorization (EUA).  This EUA will remain in effect (meaning this test can be used) for the duration of the COVID-19 declaration under Section 564(b)(1) of the Act, 21  U.S.C. section 360bbb-3(b)(1), unless the authorization is terminated or revoked sooner.  Performed at [facility], [address], [place], [place] [address]      Scheduled Meds: . acetaminophen  325 mg Oral Q6H  . budesonide (PULMICORT) nebulizer solution  0.5 mg Nebulization BID  . chlorhexidine gluconate (MEDLINE KIT)  15 mL Mouth Rinse BID  . Chlorhexidine Gluconate Cloth  6 each Topical Daily  . docusate sodium  100 mg Oral BID  . famotidine  20 mg Oral Daily  . feeding supplement (ENSURE ENLIVE)  237 mL Oral BID BM  . furosemide  40 mg Intravenous BID  . heparin  5,000 Units Subcutaneous Q8H  . ipratropium-albuterol  3 mL Nebulization TID  . multivitamin with minerals  1 tablet Per Tube Daily  . potassium chloride  40 mEq Oral Daily  . predniSONE  40 mg Oral QAC breakfast  . sodium chloride flush  3 mL Intravenous Q12H  . sodium chloride flush  3 mL Intravenous Q12H   Continuous Infusions: . sodium chloride    . thiamine injection 500 mg (06/05/20 0723)    Procedures/Studies: CT HEAD WO CONTRAST  Result Date: 05/25/2020 CLINICAL DATA:  Mental status change EXAM: CT HEAD WITHOUT CONTRAST TECHNIQUE: Contiguous axial images were obtained from the base of the skull through the vertex without intravenous contrast. COMPARISON:  05/25/2019 MRI head. FINDINGS: Brain: No acute infarct or intracranial hemorrhage. No mass lesion. No midline shift, ventriculomegaly or extra-axial fluid collection. Vascular: Right temporal calcifications likely correspond to known dural arteriovenous malformation, better demonstrated on prior MRI. Bilateral carotid siphon atherosclerotic calcifications. Skull: Negative for fracture or focal lesion. Sinuses/Orbits: Normal orbits. Mild pansinus mucosal thickening. No mastoid effusion. Other: None. IMPRESSION: No acute infarct or intracranial hemorrhage. Sequela of right temporal dural AVM. Electronically Signed   By: Stana Bunting M.D.   On: 05/25/2020  14:17   DG CHEST PORT 1 VIEW  Result Date: 05/29/2020 CLINICAL DATA:  CHF exacerbation. EXAM: PORTABLE CHEST 1 VIEW COMPARISON:  05/28/2020. FINDINGS: Interim extubation and removal of NG tube. Cardiomegaly. Diffuse bilateral interstitial prominence again noted. Interstitial edema and/or pneumonitis could present in this fashion. Persistent bibasilar atelectasis. No prominent pleural effusion. No pneumothorax. Degenerative change thoracic spine. IMPRESSION: 1. Interim extubation removal of NG tube. 2. Severe cardiomegaly again noted. Diffuse bilateral interstitial prominence again noted. Interstitial edema and/or pneumonitis could present in this fashion. Persistent bibasilar atelectasis. Similar findings noted on prior exam. Electronically Signed   By: Maisie Fus  Register   On: 05/29/2020 12:42   DG CHEST PORT 1 VIEW  Result Date: 05/28/2020 CLINICAL DATA:  Orogastric tube placement. EXAM: PORTABLE CHEST 1 VIEW COMPARISON:  Chest radiographs o from the same day FINDINGS: ET tube approximately 4.8 cm above the carina. Orogastric tube courses below the diaphragm with the tip outside the field of view. The side port is below the GE junction. Similar enlargement of the cardiac silhouette. Similar small bilateral pleural effusions. No discernible pneumothorax. Similar vascular congestion. IMPRESSION: 1. Orogastric tube courses below the diaphragm with the tip outside the field of view. 2. Similar pulmonary vascular congestion  and possible small bilateral pleural effusions. Electronically Signed   By: Margaretha Sheffield MD   On: 05/28/2020 20:14   DG CHEST PORT 1 VIEW  Result Date: 05/28/2020 CLINICAL DATA:  Acute respiratory failure and OG tube placement EXAM: PORTABLE CHEST 1 VIEW COMPARISON:  Radiograph 05/25/2020 FINDINGS: Tip of the transesophageal tube terminates in the left upper quadrant with the side port closely approximating the expected location of the GE junction. Could consider advancing 3-5 cm for  optimal positioning. Endotracheal tube tip terminates in the mid trachea, 3.2 cm from the carina. Telemetry leads overlie the chest. Surgical clip projects over the right heart border. Bilateral pleural effusions, right greater than left with adjacent opacities, likely passive atelectasis. Some diffuse hazy interstitial opacity is central pulmonary vascular congestion is noted as well. Cardiomediastinal contours are similar to prior counting for differences in technique. The aorta is calcified. The remaining cardiomediastinal contours are unremarkable. IMPRESSION: Tip of the transesophageal tube terminates in the left upper quadrant with the side port closely approximating the expected location of the GE junction. Could consider advancing 3-5 cm for optimal positioning. Stable satisfactory positioning of the endotracheal tube. Features suggesting CHF/volume overload with cardiomegaly, vascular congestion, bilateral effusions and interstitial edema. Opacities adjacent the pleural effusions likely passive atelectatic change though underlying consolidation is not excluded. Electronically Signed   By: Lovena Le M.D.   On: 05/28/2020 00:44   DG CHEST PORT 1 VIEW  Result Date: 05/26/2020 CLINICAL DATA:  Dizziness after a fall yesterday. History of prostate cancer and COPD. Smoker. EXAM: PORTABLE CHEST 1 VIEW COMPARISON:  05/25/2020 FINDINGS: Enteric and endotracheal tubes are unchanged in position since prior study. Shallow inspiration. Diffuse cardiac enlargement. Bilateral interstitial pattern to the lungs consistent with edema. Bilateral pleural effusions. Calcification of the aorta. IMPRESSION: Cardiac enlargement with bilateral interstitial edema and bilateral pleural effusions. Appliances are unchanged in position. Electronically Signed   By: Lucienne Capers M.D.   On: 05/26/2020 06:24   DG Chest Portable 1 View  Result Date: 05/25/2020 CLINICAL DATA:  Shortness of breath.  Intubation. EXAM: PORTABLE  CHEST 1 VIEW COMPARISON:  05/25/2020 chest radiograph FINDINGS: An endotracheal tube is identified with tip 4.5 cm above the carina. An NG tube is present entering the UPPER abdomen with tip off the field of view. Pulmonary vascular congestion and possible bilateral trace pleural effusions again noted. No other changes identified. IMPRESSION: 1. Endotracheal tube with tip 4.5 cm above the carina. NG tube placement. 2. Pulmonary vascular congestion and possible trace bilateral pleural effusions. Electronically Signed   By: Margarette Canada M.D.   On: 05/25/2020 11:43   DG Chest Port 1 View  Result Date: 05/25/2020 CLINICAL DATA:  69 year old male with acute shortness of breath. EXAM: PORTABLE CHEST 1 VIEW COMPARISON:  None. FINDINGS: Cardiomegaly identified. Pulmonary vascular congestion is present with possible trace effusions. There is no evidence of pneumothorax, mass or consolidation. No acute bony abnormalities are present. IMPRESSION: Cardiomegaly with pulmonary vascular congestion and possible trace effusions. Electronically Signed   By: Margarette Canada M.D.   On: 05/25/2020 09:24   ECHOCARDIOGRAM COMPLETE  Result Date: 05/26/2020    ECHOCARDIOGRAM REPORT   Patient Name:   ACXEL DINGEE Date of Exam: 05/26/2020 Medical Rec #:  [id number]      Height:       72.0 in Accession #:    [id number]     Weight:       249.6 lb Date of Birth:  03-07-1951  BSA:          2.341 m Patient Age:    74 years       BP:           105/54 mmHg Patient Gender: M              HR:           66 bpm. Exam Location:  [facility] Procedure: 2D Echo and Intracardiac Opacification Agent Indications:    Dyspnea 786.09 / R06.00  History:        Patient has no prior history of Echocardiogram examinations.                 CHF, COPD; Risk Factors:Current Smoker and Sleep Apnea. Past                 history of Orthostatic hypotension and cancer.  Sonographer:    Darlina Sicilian RDCS Referring Phys: [phone] [facility]  Sonographer Comments:  Echo performed with patient supine and on artificial respirator. IMPRESSIONS  1. Left ventricular ejection fraction, by estimation, is 55 to 60%. The left ventricle has normal function. The left ventricle has no regional wall motion abnormalities. There is mild left ventricular hypertrophy. Left ventricular diastolic parameters are indeterminate.  2. Right ventricular systolic function is moderately reduced. The right ventricular size is moderately enlarged.  3. A small pericardial effusion is present. The pericardial effusion is circumferential. There is no evidence of cardiac tamponade.  4. The mitral valve was not well visualized. No evidence of mitral valve regurgitation. No evidence of mitral stenosis.  5. The aortic valve is tricuspid. Aortic valve regurgitation is not visualized. No aortic stenosis is present.  6. The inferior vena cava is dilated in size with <50% respiratory variability, suggesting right atrial pressure of 15 mmHg.  7. Technically difficult study FINDINGS  Left Ventricle: Left ventricular ejection fraction, by estimation, is 55 to 60%. The left ventricle has normal function. The left ventricle has no regional wall motion abnormalities. Definity contrast agent was given IV to delineate the left ventricular  endocardial borders. The left ventricular internal cavity size was normal in size. There is mild left ventricular hypertrophy. Left ventricular diastolic parameters are indeterminate. Right Ventricle: The right ventricular size is moderately enlarged. Right vetricular wall thickness was not well visualized. Right ventricular systolic function is moderately reduced. Left Atrium: Left atrial size was not well visualized. Right Atrium: Right atrial size was not well visualized. Pericardium: A small pericardial effusion is present. The pericardial effusion is circumferential. There is no evidence of cardiac tamponade. Mitral Valve: The mitral valve was not well visualized. No evidence of  mitral valve regurgitation. No evidence of mitral valve stenosis. Tricuspid Valve: The tricuspid valve is not well visualized. Tricuspid valve regurgitation is trivial. No evidence of tricuspid stenosis. Aortic Valve: The aortic valve is tricuspid. Aortic valve regurgitation is not visualized. No aortic stenosis is present. Pulmonic Valve: The pulmonic valve was not well visualized. Pulmonic valve regurgitation is not visualized. No evidence of pulmonic stenosis. Aorta: The aortic root is normal in size and structure. Venous: The inferior vena cava is dilated in size with less than 50% respiratory variability, suggesting right atrial pressure of 15 mmHg. IAS/Shunts: The interatrial septum was not well visualized.  LEFT VENTRICLE PLAX 2D LVIDd:         5.44 cm      Diastology LVIDs:         3.73 cm  LV e' medial:    3.71 cm/s LV PW:         1.19 cm      LV E/e' medial:  18.4 LV IVS:        1.25 cm      LV e' lateral:   5.51 cm/s LVOT diam:     2.30 cm      LV E/e' lateral: 12.4 LV SV:         81 LV SV Index:   35 LVOT Area:     4.15 cm  LV Volumes (MOD) LV vol d, MOD A2C: 134.0 ml LV vol d, MOD A4C: 150.0 ml LV vol s, MOD A2C: 91.4 ml LV vol s, MOD A4C: 123.0 ml LV SV MOD A2C:     42.6 ml LV SV MOD A4C:     150.0 ml LV SV MOD BP:      31.8 ml LEFT ATRIUM           Index       RIGHT ATRIUM           Index LA diam:      3.50 cm 1.50 cm/m  RA Area:     25.00 cm LA Vol (A2C): 35.6 ml 15.21 ml/m RA Volume:   81.00 ml  34.60 ml/m LA Vol (A4C): 81.2 ml 34.69 ml/m  AORTIC VALVE LVOT Vmax:   95.30 cm/s LVOT Vmean:  62.700 cm/s LVOT VTI:    0.196 m  AORTA Ao Root diam: 3.60 cm MITRAL VALVE               TRICUSPID VALVE MV Area (PHT): 2.78 cm    TR Peak grad:   24.4 mmHg MV Decel Time: 273 msec    TR Vmax:        247.00 cm/s MV E velocity: 68.10 cm/s MV A velocity: 75.00 cm/s  SHUNTS MV E/A ratio:  0.91        Systemic VTI:  0.20 m                            Systemic Diam: 2.30 cm Carlyle Dolly MD Electronically  signed by Carlyle Dolly MD Signature Date/Time: 05/26/2020/4:03:26 PM    Final     Orson Eva, DO  [facility]  If 7PM-7AM, please contact night-coverage [email or web address] Password [email or web address] 06/05/2020, 12:42 PM   LOS: 11 days

## 2020-06-06 DIAGNOSIS — I5031 Acute diastolic (congestive) heart failure: Secondary | ICD-10-CM | POA: Diagnosis not present

## 2020-06-06 DIAGNOSIS — J441 Chronic obstructive pulmonary disease with (acute) exacerbation: Secondary | ICD-10-CM | POA: Diagnosis not present

## 2020-06-06 DIAGNOSIS — J9601 Acute respiratory failure with hypoxia: Secondary | ICD-10-CM | POA: Diagnosis not present

## 2020-06-06 DIAGNOSIS — Z720 Tobacco use: Secondary | ICD-10-CM | POA: Diagnosis not present

## 2020-06-06 LAB — CBC
HCT: 50.3 % (ref 39.0–52.0)
Hemoglobin: 16.1 g/dL (ref 13.0–17.0)
MCH: 30.9 pg (ref 26.0–34.0)
MCHC: 32 g/dL (ref 30.0–36.0)
MCV: 96.5 fL (ref 80.0–100.0)
Platelets: 173 10*3/uL (ref 150–400)
RBC: 5.21 MIL/uL (ref 4.22–5.81)
RDW: 17.2 % — ABNORMAL HIGH (ref 11.5–15.5)
WBC: 5.8 10*3/uL (ref 4.0–10.5)
nRBC: 0 % (ref 0.0–0.2)

## 2020-06-06 LAB — BASIC METABOLIC PANEL
Anion gap: 8 (ref 5–15)
BUN: 17 mg/dL (ref 8–23)
CO2: 36 mmol/L — ABNORMAL HIGH (ref 22–32)
Calcium: 9.2 mg/dL (ref 8.9–10.3)
Chloride: 91 mmol/L — ABNORMAL LOW (ref 98–111)
Creatinine, Ser: 0.61 mg/dL (ref 0.61–1.24)
GFR calc non Af Amer: >60 mL/min (ref >60)
Glucose, Bld: 87 mg/dL (ref 70–99)
Potassium: 3.8 mmol/L (ref 3.5–5.1)
Sodium: 135 mmol/L (ref 135–145)

## 2020-06-06 LAB — PROCALCITONIN: Procalcitonin: <0.1 ng/mL

## 2020-06-06 LAB — MAGNESIUM: Magnesium: 2.1 mg/dL (ref 1.7–2.4)

## 2020-06-06 LAB — BRAIN NATRIURETIC PEPTIDE: B Natriuretic Peptide: 340 pg/mL — ABNORMAL HIGH (ref 0.0–100.0)

## 2020-06-06 LAB — RESPIRATORY PANEL BY RT PCR (FLU A&B, COVID)
Influenza A by PCR: NEGATIVE
Influenza B by PCR: NEGATIVE
SARS Coronavirus 2 by RT PCR: NEGATIVE

## 2020-06-06 MED ORDER — FUROSEMIDE 40 MG PO TABS
40.0000 mg | ORAL_TABLET | Freq: Every day | ORAL | Status: DC
  Administered 2020-06-07: 40 mg via ORAL
  Filled 2020-06-06 (×2): qty 1

## 2020-06-06 NOTE — Progress Notes (Signed)
PROGRESS NOTE  Steven Howard RXY:[id number] DOB: 1951-02-21 DOA: 05/25/2020 PCP: Claretta Fraise, MD   Brief History: 69 year old male with a history of alcohol and tobacco abuse, COPD, tobacco user, OSA, recent urethral dilatation, right temporal AVM, orthostatic hypotension presenting with altered mental status after a fall at home. In the emergency department, the patient was found to be hypercapnic withABG 7.15/103/140 on 100% FiO2 after 2 hours of BiPAP and required mechanical ventilation.He was intubated on 03/24/2020. PCCM was consulted to assist with management. He was started on IV steroids, bronchodilators, and IV Lasix for acute diastolic CHF and COPD exacerbation. BNP was 455 with sodium 128 and serum creatinine 1.49. The patient was subsequently extubated on 05/29/2020. During the hospitalization, the patient was intermittently confused, but did not interfere with medical therapy. He was subsequently weaned off of intravenous steroids to oral steroids. Echocardiogram showed EF 55-60% with indeterminate diastolic function. Physical therapy was consulted and recommended skilled nursing facility.  Assessment/Plan: Acute respiratory failure with hypoxia and hypercapnia -Secondary COPD exacerbation and acute diastolic CHF -Currently stable on 3-4 L nasal cannula>>95-97% -Wean oxygen for saturation 88-92% -Personally reviewed chest x-ray--increased interstitial markings -repeat CXR-personally reviewed.  Improving interstitial markings  Acute diastolic CHF -628 echo EF 55 to 60%, indeterminate diastolic function, no WMA, moderate decreased RV function, small pericardial effusion -Remains fluid overloaded -continue IV furosemide -Daily weights -Accurate I's and O's--incomplete  COPD exacerbation -No wheezing presently -Continue prednisone taper -Restart bronchodilators -Started Pulmicort  Acute kidney injury -Baseline creatinine 0.5-0.7 -Serum creatinine  peaked 1.49 -Secondary to hemodynamic changes  Acute metabolic encephalopathy -Secondary to respiratory failure with hypercapnia -Remains intermittently confused although much improved -Suspect the patient may have a degree of underlying cognitive impairment -May benefit from outpatient neuropsychiatric testing -Check M38--177 -Folic NHAF--79.0 -XYB--3.383 -CT brain negative -slowly improving, now near baseline  Dysrhythmia -place on tele--reviewed--sinus with PACs -personally reviewed EKG--sinus, no STT changes  Hyponatremia -Secondary to CHF -Improved with diuresis  Hypokalemia -Repleted -mag--2.1  History of dural arteriovenous malformation of brain -Patient was recommended image guided diagnostic cerebral arteriogram with intervention by IR, but refused  Low am cortisol -already on prednisone -am cortisol 4.4 -may ultimately need cortrosyn stimulation test as outpt      Status is: Inpatient  Remains inpatient appropriate because:IV treatments appropriate due to intensity of illness or inability to take PO   Dispo: The patient is from:Home Anticipated d/c is to:SNF Anticipated d/c date is: 06/07/20 Patient currently is not medically stable to d/c.        Family Communication:Updated sister 10/8  Consultants:PCCM  Code Status: FULL  DVT Prophylaxis: [place] Heparin    Procedures: As Listed in Progress Note Above  Antibiotics: Doxy 10/1-10/4     Subjective: Patient denies fevers, chills, headache, chest pain, dyspnea, nausea, vomiting, diarrhea, abdominal pain, dysuria, hematuria, hematochezia, and melena.   Objective: Vitals:   06/05/20 2105 06/06/20 0434 06/06/20 0456 06/06/20 0735  BP: 101/71 119/74    Pulse: 92 86    Resp: 20 16    Temp: 97.8 F (36.6 C) 98.2 F (36.8 C)    TempSrc: Oral Oral    SpO2: 93% 93%  94%  Weight:   99 kg   Height:         Intake/Output Summary (Last 24 hours) at 06/06/2020 1041 Last data filed at 06/06/2020 0300 Gross per 24 hour  Intake 720 ml  Output 3200 ml  Net -2480 ml  Weight change:  Exam:   General:  Pt is alert, follows commands appropriately, not in acute distress  HEENT: No icterus, No thrush, No neck mass, [place]/AT  Cardiovascular: RRR, S1/S2, no rubs, no gallops  Respiratory:bibasilar crackles. No wheeze  Abdomen: Soft/+BS, non tender, non distended, no guarding  Extremities: 1+LE edema, No lymphangitis, No petechiae, No rashes, no synovitis   Data Reviewed: I have personally reviewed following labs and imaging studies Basic Metabolic Panel: Recent Labs  Lab 05/31/20 0704 06/01/20 0600 06/04/20 0847 06/05/20 0603 06/06/20 0722  NA 142 141 137 136 135  K 3.4* 3.7 3.9 4.0 3.8  CL 97* 94* 88* 90* 91*  CO2 37* 39* 41* 38* 36*  GLUCOSE 86 90 73 85 87  BUN _0 CREATININE 0.52* 0.51* 0.54* 0.59* 0.61  CALCIUM 8.9 9.0 9.1 9.2 9.2  MG 1.8 1.9 1.7 1.7 2.1   Liver Function Tests: No results for input(s): AST, ALT, ALKPHOS, BILITOT, PROT, ALBUMIN in the last 168 hours. No results for input(s): LIPASE, AMYLASE in the last 168 hours. No results for input(s): AMMONIA in the last 168 hours. Coagulation Profile: No results for input(s): INR, PROTIME in the last 168 hours. CBC: Recent Labs  Lab 05/31/20 0704 06/01/20 0600 06/06/20 0722  WBC 9.4 6.4 5.8  HGB 16.1 15.8 16.1  HCT 51.2 51.5 50.3  MCV 101.2* 101.4* 96.5  PLT 203 211 173   Cardiac Enzymes: No results for input(s): CKTOTAL, CKMB, CKMBINDEX, TROPONINI in the last 168 hours. BNP: Invalid input(s): POCBNP CBG: Recent Labs  Lab 06/04/20 2132  GLUCAP 153*   HbA1C: No results for input(s): HGBA1C in the last 72 hours. Urine analysis:    Component Value Date/Time   COLORURINE STRAW (A) 05/25/2020 0831   APPEARANCEUR CLEAR 05/25/2020 0831   LABSPEC 1.004 (L) 05/25/2020 0831   PHURINE 6.0  05/25/2020 0831   GLUCOSEU NEGATIVE 05/25/2020 0831   HGBUR MODERATE (A) 05/25/2020 0831   BILIRUBINUR NEGATIVE 05/25/2020 0831   KETONESUR 5 (A) 05/25/2020 0831   PROTEINUR NEGATIVE 05/25/2020 0831   NITRITE NEGATIVE 05/25/2020 0831   LEUKOCYTESUR SMALL (A) 05/25/2020 0831   Sepsis Labs: _1 (procalcitonin:4,lacticidven:4) ) Recent Results (from the past 240 hour(s))  SARS Coronavirus 2 by RT PCR (hospital order, performed in [place] hospital lab) Nasopharyngeal Nasopharyngeal Swab     Status: None   Collection Time: 06/03/20 11:00 AM   Specimen: Nasopharyngeal Swab  Result Value Ref Range Status   SARS Coronavirus 2 NEGATIVE NEGATIVE Final    Comment: (NOTE) SARS-CoV-2 target nucleic acids are NOT DETECTED.  The SARS-CoV-2 RNA is generally detectable in upper and lower respiratory specimens during the acute phase of infection. The lowest concentration of SARS-CoV-2 viral copies this assay can detect is 250 copies / mL. A negative result does not preclude SARS-CoV-2 infection and should not be used as the sole basis for treatment or other patient management decisions.  A negative result may occur with improper specimen collection / handling, submission of specimen other than nasopharyngeal swab, presence of viral mutation(s) within the areas targeted by this assay, and inadequate number of viral copies (<250 copies / mL). A negative result must be combined with clinical observations, patient history, and epidemiological information.  Fact Sheet for Patients:   StrictlyIdeas.no  Fact Sheet for Healthcare Providers: BankingDealers.co.za  This test is not yet approved or  cleared by the [place] FDA and has been authorized for detection and/or diagnosis of SARS-CoV-2 by FDA under  an Emergency Use Authorization (EUA).  This EUA will remain in effect (meaning this test can be used) for the duration of the COVID-19  declaration under Section 564(b)(1) of the Act, 21 U.S.C. section 360bbb-3(b)(1), unless the authorization is terminated or revoked sooner.  Performed at [facility], [address] [place], [place] [address]      Scheduled Meds: . acetaminophen  325 mg Oral Q6H  . budesonide (PULMICORT) nebulizer solution  0.5 mg Nebulization BID  . chlorhexidine gluconate (MEDLINE KIT)  15 mL Mouth Rinse BID  . Chlorhexidine Gluconate Cloth  6 each Topical Daily  . docusate sodium  100 mg Oral BID  . famotidine  20 mg Oral Daily  . feeding supplement (ENSURE ENLIVE)  237 mL Oral BID BM  . furosemide  40 mg Intravenous BID  . heparin  5,000 Units Subcutaneous Q8H  . ipratropium-albuterol  3 mL Nebulization TID  . multivitamin with minerals  1 tablet Per Tube Daily  . potassium chloride  40 mEq Oral Daily  . predniSONE  40 mg Oral QAC breakfast  . sodium chloride flush  3 mL Intravenous Q12H  . sodium chloride flush  3 mL Intravenous Q12H   Continuous Infusions: . sodium chloride      Procedures/Studies: CT HEAD WO CONTRAST  Result Date: 05/25/2020 CLINICAL DATA:  Mental status change EXAM: CT HEAD WITHOUT CONTRAST TECHNIQUE: Contiguous axial images were obtained from the base of the skull through the vertex without intravenous contrast. COMPARISON:  05/25/2019 MRI head. FINDINGS: Brain: No acute infarct or intracranial hemorrhage. No mass lesion. No midline shift, ventriculomegaly or extra-axial fluid collection. Vascular: Right temporal calcifications likely correspond to known dural arteriovenous malformation, better demonstrated on prior MRI. Bilateral carotid siphon atherosclerotic calcifications. Skull: Negative for fracture or focal lesion. Sinuses/Orbits: Normal orbits. Mild pansinus mucosal thickening. No mastoid effusion. Other: None. IMPRESSION: No acute infarct or intracranial hemorrhage. Sequela of right temporal dural AVM. Electronically Signed   By: Primitivo Gauze M.D.   On:  05/25/2020 14:17   DG CHEST PORT 1 VIEW  Result Date: 06/05/2020 CLINICAL DATA:  Acute CHF. EXAM: PORTABLE CHEST 1 VIEW COMPARISON:  05/29/2020. FINDINGS: Cardiomegaly. Mild pulmonary venous congestion. Interim improvement of bilateral interstitial prominence. Persistent bibasilar atelectasis/infiltrates. Small right pleural effusion. No pneumothorax. Carotid vascular calcification. IMPRESSION: 1. Cardiomegaly with mild pulmonary venous congestion. Interim improvement of bilateral interstitial prominence. Persistent bibasilar atelectasis/infiltrates. Small right pleural effusion. 2.  Carotid vascular disease. Electronically Signed   By: Marcello Moores  Register   On: 06/05/2020 14:16   DG CHEST PORT 1 VIEW  Result Date: 05/29/2020 CLINICAL DATA:  CHF exacerbation. EXAM: PORTABLE CHEST 1 VIEW COMPARISON:  05/28/2020. FINDINGS: Interim extubation and removal of NG tube. Cardiomegaly. Diffuse bilateral interstitial prominence again noted. Interstitial edema and/or pneumonitis could present in this fashion. Persistent bibasilar atelectasis. No prominent pleural effusion. No pneumothorax. Degenerative change thoracic spine. IMPRESSION: 1. Interim extubation removal of NG tube. 2. Severe cardiomegaly again noted. Diffuse bilateral interstitial prominence again noted. Interstitial edema and/or pneumonitis could present in this fashion. Persistent bibasilar atelectasis. Similar findings noted on prior exam. Electronically Signed   By: [place]   On: 05/29/2020 12:42   DG CHEST PORT 1 VIEW  Result Date: 05/28/2020 CLINICAL DATA:  Orogastric tube placement. EXAM: PORTABLE CHEST 1 VIEW COMPARISON:  Chest radiographs o from the same day FINDINGS: ET tube approximately 4.8 cm above the carina. Orogastric tube courses below the diaphragm with the tip outside the field of view. The  side port is below the GE junction. Similar enlargement of the cardiac silhouette. Similar small bilateral pleural effusions. No  discernible pneumothorax. Similar vascular congestion. IMPRESSION: 1. Orogastric tube courses below the diaphragm with the tip outside the field of view. 2. Similar pulmonary vascular congestion and possible small bilateral pleural effusions. Electronically Signed   By: Margaretha Sheffield MD   On: 05/28/2020 20:14   DG CHEST PORT 1 VIEW  Result Date: 05/28/2020 CLINICAL DATA:  Acute respiratory failure and OG tube placement EXAM: PORTABLE CHEST 1 VIEW COMPARISON:  Radiograph 05/25/2020 FINDINGS: Tip of the transesophageal tube terminates in the left upper quadrant with the side port closely approximating the expected location of the GE junction. Could consider advancing 3-5 cm for optimal positioning. Endotracheal tube tip terminates in the mid trachea, 3.2 cm from the carina. Telemetry leads overlie the chest. Surgical clip projects over the right heart border. Bilateral pleural effusions, right greater than left with adjacent opacities, likely passive atelectasis. Some diffuse hazy interstitial opacity is central pulmonary vascular congestion is noted as well. Cardiomediastinal contours are similar to prior counting for differences in technique. The aorta is calcified. The remaining cardiomediastinal contours are unremarkable. IMPRESSION: Tip of the transesophageal tube terminates in the left upper quadrant with the side port closely approximating the expected location of the GE junction. Could consider advancing 3-5 cm for optimal positioning. Stable satisfactory positioning of the endotracheal tube. Features suggesting CHF/volume overload with cardiomegaly, vascular congestion, bilateral effusions and interstitial edema. Opacities adjacent the pleural effusions likely passive atelectatic change though underlying consolidation is not excluded. Electronically Signed   By: Lovena Le M.D.   On: 05/28/2020 00:44   DG CHEST PORT 1 VIEW  Result Date: 05/26/2020 CLINICAL DATA:  Dizziness after a fall  yesterday. History of prostate cancer and COPD. Smoker. EXAM: PORTABLE CHEST 1 VIEW COMPARISON:  05/25/2020 FINDINGS: Enteric and endotracheal tubes are unchanged in position since prior study. Shallow inspiration. Diffuse cardiac enlargement. Bilateral interstitial pattern to the lungs consistent with edema. Bilateral pleural effusions. Calcification of the aorta. IMPRESSION: Cardiac enlargement with bilateral interstitial edema and bilateral pleural effusions. Appliances are unchanged in position. Electronically Signed   By: Lucienne Capers M.D.   On: 05/26/2020 06:24   DG Chest Portable 1 View  Result Date: 05/25/2020 CLINICAL DATA:  Shortness of breath.  Intubation. EXAM: PORTABLE CHEST 1 VIEW COMPARISON:  05/25/2020 chest radiograph FINDINGS: An endotracheal tube is identified with tip 4.5 cm above the carina. An NG tube is present entering the UPPER abdomen with tip off the field of view. Pulmonary vascular congestion and possible bilateral trace pleural effusions again noted. No other changes identified. IMPRESSION: 1. Endotracheal tube with tip 4.5 cm above the carina. NG tube placement. 2. Pulmonary vascular congestion and possible trace bilateral pleural effusions. Electronically Signed   By: Margarette Canada M.D.   On: 05/25/2020 11:43   DG Chest Port 1 View  Result Date: 05/25/2020 CLINICAL DATA:  69 year old male with acute shortness of breath. EXAM: PORTABLE CHEST 1 VIEW COMPARISON:  None. FINDINGS: Cardiomegaly identified. Pulmonary vascular congestion is present with possible trace effusions. There is no evidence of pneumothorax, mass or consolidation. No acute bony abnormalities are present. IMPRESSION: Cardiomegaly with pulmonary vascular congestion and possible trace effusions. Electronically Signed   By: Margarette Canada M.D.   On: 05/25/2020 09:24   ECHOCARDIOGRAM COMPLETE  Result Date: 05/26/2020    ECHOCARDIOGRAM REPORT   Patient Name:   JAHMEL FLANNAGAN Date of Exam: 05/26/2020  Medical Rec #:   [id number]      Height:       72.0 in Accession #:    [id number]     Weight:       249.6 lb Date of Birth:  03/17/1951      BSA:          2.341 m Patient Age:    30 years       BP:           105/54 mmHg Patient Gender: M              HR:           66 bpm. Exam Location:  [facility] Procedure: 2D Echo and Intracardiac Opacification Agent Indications:    Dyspnea 786.09 / R06.00  History:        Patient has no prior history of Echocardiogram examinations.                 CHF, COPD; Risk Factors:Current Smoker and Sleep Apnea. Past                 history of Orthostatic hypotension and cancer.  Sonographer:    Darlina Sicilian RDCS Referring Phys: [phone] [facility]  Sonographer Comments: Echo performed with patient supine and on artificial respirator. IMPRESSIONS  1. Left ventricular ejection fraction, by estimation, is 55 to 60%. The left ventricle has normal function. The left ventricle has no regional wall motion abnormalities. There is mild left ventricular hypertrophy. Left ventricular diastolic parameters are indeterminate.  2. Right ventricular systolic function is moderately reduced. The right ventricular size is moderately enlarged.  3. A small pericardial effusion is present. The pericardial effusion is circumferential. There is no evidence of cardiac tamponade.  4. The mitral valve was not well visualized. No evidence of mitral valve regurgitation. No evidence of mitral stenosis.  5. The aortic valve is tricuspid. Aortic valve regurgitation is not visualized. No aortic stenosis is present.  6. The inferior vena cava is dilated in size with <50% respiratory variability, suggesting right atrial pressure of 15 mmHg.  7. Technically difficult study FINDINGS  Left Ventricle: Left ventricular ejection fraction, by estimation, is 55 to 60%. The left ventricle has normal function. The left ventricle has no regional wall motion abnormalities. Definity contrast agent was given IV to delineate the left ventricular   endocardial borders. The left ventricular internal cavity size was normal in size. There is mild left ventricular hypertrophy. Left ventricular diastolic parameters are indeterminate. Right Ventricle: The right ventricular size is moderately enlarged. Right vetricular wall thickness was not well visualized. Right ventricular systolic function is moderately reduced. Left Atrium: Left atrial size was not well visualized. Right Atrium: Right atrial size was not well visualized. Pericardium: A small pericardial effusion is present. The pericardial effusion is circumferential. There is no evidence of cardiac tamponade. Mitral Valve: The mitral valve was not well visualized. No evidence of mitral valve regurgitation. No evidence of mitral valve stenosis. Tricuspid Valve: The tricuspid valve is not well visualized. Tricuspid valve regurgitation is trivial. No evidence of tricuspid stenosis. Aortic Valve: The aortic valve is tricuspid. Aortic valve regurgitation is not visualized. No aortic stenosis is present. Pulmonic Valve: The pulmonic valve was not well visualized. Pulmonic valve regurgitation is not visualized. No evidence of pulmonic stenosis. Aorta: The aortic root is normal in size and structure. Venous: The inferior vena cava is dilated in size with less than 50% respiratory variability, suggesting right atrial pressure of  15 mmHg. IAS/Shunts: The interatrial septum was not well visualized.  LEFT VENTRICLE PLAX 2D LVIDd:         5.44 cm      Diastology LVIDs:         3.73 cm      LV e' medial:    3.71 cm/s LV PW:         1.19 cm      LV E/e' medial:  18.4 LV IVS:        1.25 cm      LV e' lateral:   5.51 cm/s LVOT diam:     2.30 cm      LV E/e' lateral: 12.4 LV SV:         81 LV SV Index:   35 LVOT Area:     4.15 cm  LV Volumes (MOD) LV vol d, MOD A2C: 134.0 ml LV vol d, MOD A4C: 150.0 ml LV vol s, MOD A2C: 91.4 ml LV vol s, MOD A4C: 123.0 ml LV SV MOD A2C:     42.6 ml LV SV MOD A4C:     150.0 ml LV SV MOD BP:       31.8 ml LEFT ATRIUM           Index       RIGHT ATRIUM           Index LA diam:      3.50 cm 1.50 cm/m  RA Area:     25.00 cm LA Vol (A2C): 35.6 ml 15.21 ml/m RA Volume:   81.00 ml  34.60 ml/m LA Vol (A4C): 81.2 ml 34.69 ml/m  AORTIC VALVE LVOT Vmax:   95.30 cm/s LVOT Vmean:  62.700 cm/s LVOT VTI:    0.196 m  AORTA Ao Root diam: 3.60 cm MITRAL VALVE               TRICUSPID VALVE MV Area (PHT): 2.78 cm    TR Peak grad:   24.4 mmHg MV Decel Time: 273 msec    TR Vmax:        247.00 cm/s MV E velocity: 68.10 cm/s MV A velocity: 75.00 cm/s  SHUNTS MV E/A ratio:  0.91        Systemic VTI:  0.20 m                            Systemic Diam: 2.30 cm Carlyle Dolly MD Electronically signed by Carlyle Dolly MD Signature Date/Time: 05/26/2020/4:03:26 PM    Final     Orson Eva, DO  [facility]  If 7PM-7AM, please contact night-coverage [email or web address] Password [email or web address] 06/06/2020, 10:41 AM   LOS: 12 days

## 2020-06-06 NOTE — Progress Notes (Signed)
Physical Therapy Treatment Patient Details Name: Steven Howard MRN: [id number] DOB: July 17, 1951 Today's Date: 06/06/2020    History of Present Illness 69 year old smoker with known COPD, BIBEMS for altered mental status and after a fall at home, found to have ABG 7.15/103/140 on 100% FiO2 after 2 hours of BiPAP and required mechanical ventilation.  BNP was 455, creatinine 1.5, sodium 128, chest x-ray showed bilateral interstitial prominence consistent with CHF and trace bilateral effusions.He was admitted to ICU, sedated, diuresed.  He was also treated with IV steroids bronchodilators and doxycycline for an acute COPD exacerbation.  PCCM consulted to assist with ICU management    PT Comments    Patient presents on 4 LPM with SpO2 at 85-87% and agreeable for therapy.  Patient has difficulty maintaining standing balance using RW while putting on depends, tends to fall backwards requiring verbals to lean on RW, demonstrates good return for completing BLE exercises while seated at bedside, slow labored cadence during gait training without loss balance, limited secondary to fatigue and SpO2 dropping from 88% to 82%.  Patient tolerated sitting up in chair after therapy and increased O2 to 4.5 LPM in order for SpO2 to remain at 89-90% - RN notified.  Patient will benefit from continued physical therapy in hospital and recommended venue below to increase strength, balance, endurance for safe ADLs and gait.    Follow Up Recommendations  SNF;Supervision for mobility/OOB     Equipment Recommendations  None recommended by PT    Recommendations for Other Services       Precautions / Restrictions Precautions Precautions: Fall Restrictions Weight Bearing Restrictions: No    Mobility  Bed Mobility Overal bed mobility: Needs Assistance Bed Mobility: Supine to Sit     Supine to sit: Min guard;Supervision Sit to supine: HOB elevated      Transfers Overall transfer level: Needs  assistance Equipment used: Rolling walker (2 wheeled) Transfers: Sit to/from [facility] Sit to Stand: Min assist Stand pivot transfers: Min guard;Min assist       General transfer comment: increased time, labored movement, verbal cues for proper hand placement during sit to stands  Ambulation/Gait Ambulation/Gait assistance: Min assist Gait Distance (Feet): 35 Feet Assistive device: Rolling walker (2 wheeled) Gait Pattern/deviations: Step-to pattern;Decreased step length - right;Decreased step length - left;Decreased stride length;Wide base of support Gait velocity: decreased   General Gait Details: slow labored cadence with occasional standing rest breaks due to fatigue, no loss of balance and required increased time to make turns   Stairs             Wheelchair Mobility    Modified Rankin (Stroke Patients Only)       Balance Overall balance assessment: Needs assistance Sitting-balance support: Feet supported;No upper extremity supported Sitting balance-Leahy Scale: Good Sitting balance - Comments: seated EOB   Standing balance support: During functional activity;Bilateral upper extremity supported Standing balance-Leahy Scale: Fair Standing balance comment: using RW                            Cognition Arousal/Alertness: Awake/alert Behavior During Therapy: WFL for tasks assessed/performed Overall Cognitive Status: Within Functional Limits for tasks assessed                                        Exercises General Exercises - Lower Extremity Long Arc Quad: Seated;AROM;Strengthening;Both;15 reps  Hip Flexion/Marching: Seated;AROM;Strengthening;Both;15 reps Toe Raises: Seated;AROM;Strengthening;15 reps;Both Heel Raises: Seated;AROM;Strengthening;15 reps;Both    General Comments        Pertinent Vitals/Pain Pain Assessment: No/denies pain    Home Living                      Prior Function             PT Goals (current goals can now be found in the care plan section) Acute Rehab PT Goals Patient Stated Goal: Go home. PT Goal Formulation: With patient Time For Goal Achievement: 06/12/20 Potential to Achieve Goals: Good Progress towards PT goals: Progressing toward goals    Frequency    Min 3X/week      PT Plan Current plan remains appropriate    Co-evaluation              AM-PAC PT "6 Clicks" Mobility   Outcome Measure  Help needed turning from your back to your side while in a flat bed without using bedrails?: None Help needed moving from lying on your back to sitting on the side of a flat bed without using bedrails?: A Little Help needed moving to and from a bed to a chair (including a wheelchair)?: A Little Help needed standing up from a chair using your arms (e.g., wheelchair or bedside chair)?: A Little Help needed to walk in hospital room?: A Little Help needed climbing 3-5 steps with a railing? : A Lot 6 Click Score: 18    End of Session Equipment Utilized During Treatment: Oxygen Activity Tolerance: Patient tolerated treatment well Patient left: in bed;with call bell/phone within reach Nurse Communication: Mobility status;Other (comment) PT Visit Diagnosis: Unsteadiness on feet (R26.81);Other abnormalities of gait and mobility (R26.89);Muscle weakness (generalized) (M62.81);Difficulty in walking, not elsewhere classified (R26.2)     Time: 0223-3612 PT Time Calculation (min) (ACUTE ONLY): 37 min  Charges:  $Gait Training: 8-22 mins $Therapeutic Exercise: 8-22 mins                     3:50 PM, 06/06/20 Steven Howard, MPT Physical Therapist with [facility] [phone] office [phone] mobile phone

## 2020-06-06 NOTE — Plan of Care (Signed)
  Problem: Education: Goal: Knowledge of General Education information will improve Description: Including pain rating scale, medication(s)/side effects and non-pharmacologic comfort measures Outcome: Progressing   Problem: Coping: Goal: Level of anxiety will decrease Outcome: Progressing   Problem: Safety: Goal: Ability to remain free from injury will improve Outcome: Progressing   

## 2020-06-06 NOTE — Discharge Summary (Addendum)
Physician Discharge Summary  Steven Howard ZGY:[id number] DOB: [birth year] DOA: 05/25/2020  PCP: Claretta Fraise, MD  Admit date: 05/25/2020 Discharge date: 06/07/2020  Admitted From:  Home Disposition:  SNF  Recommendations for Outpatient Follow-up:  1. Follow up with PCP in 1-2 weeks 2. Please obtain BMP/CBC in one week 3. Keep patient on 2L [place], wean to RA for saturation >92%    Discharge Condition: Stable CODE STATUS: FULL Diet recommendation: Heart Healthy   Brief/Interim Summary: 69 year old male with a history of alcohol and tobacco abuse, COPD, tobacco user, OSA, recent urethral dilatation, right temporal AVM, orthostatic hypotension presenting with altered mental status after a fall at home. In the emergency department, the patient was found to be hypercapnic withABG 7.15/103/140 on 100% FiO2 after 2 hours of BiPAP and required mechanical ventilation.He was intubated on 03/24/2020. PCCM was consulted to assist with management. He was started on IV steroids, bronchodilators, and IV Lasix for acute diastolic CHF and COPD exacerbation. BNP was 455 with sodium 128 and serum creatinine 1.49. The patient was subsequently extubated on 05/29/2020. During the hospitalization, the patient was intermittently confused, but did not interferewith medical therapy. He was subsequently weaned off of intravenous steroids to oral steroids. Echocardiogram showed EF 55-60% with indeterminate diastolic function. Physical therapy was consulted and recommended skilled nursing facility.  Discharge Diagnoses:  Acute respiratory failure with hypoxia and hypercapnia -Secondary COPD exacerbation and acute diastolic CHF -Currently stable on 2L nasal cannula>>95-97% -Wean oxygen for saturation 88-92% -Personally reviewed chest x-ray--increased interstitial markings -repeat CXR-personally reviewed.  Improving interstitial markings  Acute diastolic CHF -5/91 echo EF 55 to 60%, indeterminate  diastolic function, no WMA, moderate decreased RV function, small pericardial effusion -continueIV furosemide>>po lasix -Daily weights -Accurate I's and O's--incomplete -start low dose metoprolol tartrate  COPD exacerbation -No wheezing presently -Continue prednisone taper (40 mg daily presently)-->taper as noted in meds section -Restart bronchodilators-->d/c with Breo and duonebs prn -StartedPulmicort during hospitalization  Acute kidney injury -Baseline creatinine 0.5-0.7 -Serum creatinine peaked 1.49 -Secondary to hemodynamic changes -serum creatinine 0.55 at time of d/c  Acute metabolic encephalopathy -Secondary to respiratory failure with hypercapnia -Remains intermittently confused although much improved -Suspect the patient may have a degree of underlying cognitive impairment -May benefit from outpatient neuropsychiatric testing -Check M38--466 -Folic ZLDJ--57.0 -VXB--9.390 -CT brain negative -improved.  Now at baseline  Dysrhythmia -place on tele--reviewed--sinus with PACs -personally reviewed EKG--sinus, no STT changes  Hyponatremia -Secondary to CHF -Improved with diuresis  Hypokalemia -Repleted -mag--2.1  History of dural arteriovenous malformation of brain -Patient was recommended image guided diagnostic cerebral arteriogram with intervention by IR, but refused  Low am cortisol -already on prednisone -am cortisol 4.4 -may ultimately need cortrosyn stimulation test as outpt     Discharge Instructions  Discharge Instructions    Ambulatory referral to Urology   Complete by: As directed    Missed appt with [place] while in hospital     Allergies as of 06/07/2020   No Known Allergies     Medication List    TAKE these medications   albuterol 108 (90 Base) MCG/ACT inhaler Commonly known as: VENTOLIN HFA Inhale 2 puffs into the lungs every 4 (four) hours as needed for wheezing or shortness of breath (cough, shortness of breath or  wheezing.).   Breo Ellipta 100-25 MCG/INH Aepb Generic drug: fluticasone furoate-vilanterol INHALE 1 PUFF INTO LUNGS DAILY What changed: See the new instructions.   docusate sodium 100 MG capsule Commonly known as: COLACE Take 1 capsule (100 mg  total) by mouth daily.   furosemide 40 MG tablet Commonly known as: LASIX Take 1 tablet (40 mg total) by mouth daily. Start taking on: June 08, 2020   ipratropium-albuterol 0.5-2.5 (3) MG/3ML Soln Commonly known as: DUONEB Take 3 mLs by nebulization every 4 (four) hours as needed.   metoprolol tartrate 25 MG tablet Commonly known as: LOPRESSOR Take 0.5 tablets (12.5 mg total) by mouth 2 (two) times daily.   multivitamin with minerals Tabs tablet Take 1 tablet by mouth daily.   nicotine 21 mg/24hr patch Commonly known as: NICODERM CQ - dosed in mg/24 hours Place 1 patch (21 mg total) onto the skin daily as needed (nicotine cravings).   polyethylene glycol 17 g packet Commonly known as: MIRALAX / GLYCOLAX Take 17 g by mouth daily as needed for mild constipation.   potassium chloride SA 20 MEQ tablet Commonly known as: KLOR-CON Take 2 tablets (40 mEq total) by mouth daily.   predniSONE 20 MG tablet Commonly known as: DELTASONE Take 2 tabs daily with breakfast x 3 days, then 1 tab daily x 7 days       Contact information for follow-up providers    Claretta Fraise, MD. Schedule an appointment as soon as possible for a visit in 2 week(s).   Specialty: Family Medicine Contact information: [place] [address] [phone]        Minus Breeding, MD. Schedule an appointment as soon as possible for a visit in 1 month(s).   Specialty: Cardiology Contact information: [place] [address] [phone]        Cleon Gustin, MD. Go on 06/05/2020.   Specialty: Urology Why: As already scheduled for Urology Follow Up appointment  Contact information: [address] [place]  [address] [phone]            Contact information for after-discharge care    [facility] .   Service: Skilled Nursing Contact information: [address] [place] [phone]                 No Known Allergies  Consultations:  none   Procedures/Studies: CT HEAD WO CONTRAST  Result Date: 05/25/2020 CLINICAL DATA:  Mental status change EXAM: CT HEAD WITHOUT CONTRAST TECHNIQUE: Contiguous axial images were obtained from the base of the skull through the vertex without intravenous contrast. COMPARISON:  05/25/2019 MRI head. FINDINGS: Brain: No acute infarct or intracranial hemorrhage. No mass lesion. No midline shift, ventriculomegaly or extra-axial fluid collection. Vascular: Right temporal calcifications likely correspond to known dural arteriovenous malformation, better demonstrated on prior MRI. Bilateral carotid siphon atherosclerotic calcifications. Skull: Negative for fracture or focal lesion. Sinuses/Orbits: Normal orbits. Mild pansinus mucosal thickening. No mastoid effusion. Other: None. IMPRESSION: No acute infarct or intracranial hemorrhage. Sequela of right temporal dural AVM. Electronically Signed   By: Primitivo Gauze M.D.   On: 05/25/2020 14:17   DG CHEST PORT 1 VIEW  Result Date: 06/05/2020 CLINICAL DATA:  Acute CHF. EXAM: PORTABLE CHEST 1 VIEW COMPARISON:  05/29/2020. FINDINGS: Cardiomegaly. Mild pulmonary venous congestion. Interim improvement of bilateral interstitial prominence. Persistent bibasilar atelectasis/infiltrates. Small right pleural effusion. No pneumothorax. Carotid vascular calcification. IMPRESSION: 1. Cardiomegaly with mild pulmonary venous congestion. Interim improvement of bilateral interstitial prominence. Persistent bibasilar atelectasis/infiltrates. Small right pleural effusion. 2.  Carotid vascular disease. Electronically Signed   By: Marcello Moores  Register   On: 06/05/2020  14:16  DG CHEST PORT 1 VIEW  Result Date: 05/29/2020 CLINICAL DATA:  CHF exacerbation. EXAM: PORTABLE CHEST 1 VIEW COMPARISON:  05/28/2020. FINDINGS: Interim extubation and removal of NG tube. Cardiomegaly. Diffuse bilateral interstitial prominence again noted. Interstitial edema and/or pneumonitis could present in this fashion. Persistent bibasilar atelectasis. No prominent pleural effusion. No pneumothorax. Degenerative change thoracic spine. IMPRESSION: 1. Interim extubation removal of NG tube. 2. Severe cardiomegaly again noted. Diffuse bilateral interstitial prominence again noted. Interstitial edema and/or pneumonitis could present in this fashion. Persistent bibasilar atelectasis. Similar findings noted on prior exam. Electronically Signed   By: [place]   On: 05/29/2020 12:42   DG CHEST PORT 1 VIEW  Result Date: 05/28/2020 CLINICAL DATA:  Orogastric tube placement. EXAM: PORTABLE CHEST 1 VIEW COMPARISON:  Chest radiographs o from the same day FINDINGS: ET tube approximately 4.8 cm above the carina. Orogastric tube courses below the diaphragm with the tip outside the field of view. The side port is below the GE junction. Similar enlargement of the cardiac silhouette. Similar small bilateral pleural effusions. No discernible pneumothorax. Similar vascular congestion. IMPRESSION: 1. Orogastric tube courses below the diaphragm with the tip outside the field of view. 2. Similar pulmonary vascular congestion and possible small bilateral pleural effusions. Electronically Signed   By: Margaretha Sheffield MD   On: 05/28/2020 20:14   DG CHEST PORT 1 VIEW  Result Date: 05/28/2020 CLINICAL DATA:  Acute respiratory failure and OG tube placement EXAM: PORTABLE CHEST 1 VIEW COMPARISON:  Radiograph 05/25/2020 FINDINGS: Tip of the transesophageal tube terminates in the left upper quadrant with the side port closely approximating the expected location of the GE junction. Could consider advancing 3-5 cm for  optimal positioning. Endotracheal tube tip terminates in the mid trachea, 3.2 cm from the carina. Telemetry leads overlie the chest. Surgical clip projects over the right heart border. Bilateral pleural effusions, right greater than left with adjacent opacities, likely passive atelectasis. Some diffuse hazy interstitial opacity is central pulmonary vascular congestion is noted as well. Cardiomediastinal contours are similar to prior counting for differences in technique. The aorta is calcified. The remaining cardiomediastinal contours are unremarkable. IMPRESSION: Tip of the transesophageal tube terminates in the left upper quadrant with the side port closely approximating the expected location of the GE junction. Could consider advancing 3-5 cm for optimal positioning. Stable satisfactory positioning of the endotracheal tube. Features suggesting CHF/volume overload with cardiomegaly, vascular congestion, bilateral effusions and interstitial edema. Opacities adjacent the pleural effusions likely passive atelectatic change though underlying consolidation is not excluded. Electronically Signed   By: Lovena Le M.D.   On: 05/28/2020 00:44   DG CHEST PORT 1 VIEW  Result Date: 05/26/2020 CLINICAL DATA:  Dizziness after a fall yesterday. History of prostate cancer and COPD. Smoker. EXAM: PORTABLE CHEST 1 VIEW COMPARISON:  05/25/2020 FINDINGS: Enteric and endotracheal tubes are unchanged in position since prior study. Shallow inspiration. Diffuse cardiac enlargement. Bilateral interstitial pattern to the lungs consistent with edema. Bilateral pleural effusions. Calcification of the aorta. IMPRESSION: Cardiac enlargement with bilateral interstitial edema and bilateral pleural effusions. Appliances are unchanged in position. Electronically Signed   By: Lucienne Capers M.D.   On: 05/26/2020 06:24   DG Chest Portable 1 View  Result Date: 05/25/2020 CLINICAL DATA:  Shortness of breath.  Intubation. EXAM: PORTABLE  CHEST 1 VIEW COMPARISON:  05/25/2020 chest radiograph FINDINGS: An endotracheal tube is identified with tip 4.5 cm above the carina. An NG tube is present entering the UPPER abdomen with tip  off the field of view. Pulmonary vascular congestion and possible bilateral trace pleural effusions again noted. No other changes identified. IMPRESSION: 1. Endotracheal tube with tip 4.5 cm above the carina. NG tube placement. 2. Pulmonary vascular congestion and possible trace bilateral pleural effusions. Electronically Signed   By: Margarette Canada M.D.   On: 05/25/2020 11:43   DG Chest Port 1 View  Result Date: 05/25/2020 CLINICAL DATA:  69 year old male with acute shortness of breath. EXAM: PORTABLE CHEST 1 VIEW COMPARISON:  None. FINDINGS: Cardiomegaly identified. Pulmonary vascular congestion is present with possible trace effusions. There is no evidence of pneumothorax, mass or consolidation. No acute bony abnormalities are present. IMPRESSION: Cardiomegaly with pulmonary vascular congestion and possible trace effusions. Electronically Signed   By: Margarette Canada M.D.   On: 05/25/2020 09:24   ECHOCARDIOGRAM COMPLETE  Result Date: 05/26/2020    ECHOCARDIOGRAM REPORT   Patient Name:   NELLO CORRO Date of Exam: 05/26/2020 Medical Rec #:  [id number]      Height:       72.0 in Accession #:    [id number]     Weight:       249.6 lb Date of Birth:  1950-12-02      BSA:          2.341 m Patient Age:    26 years       BP:           105/54 mmHg Patient Gender: M              HR:           66 bpm. Exam Location:  [facility] Procedure: 2D Echo and Intracardiac Opacification Agent Indications:    Dyspnea 786.09 / R06.00  History:        Patient has no prior history of Echocardiogram examinations.                 CHF, COPD; Risk Factors:Current Smoker and Sleep Apnea. Past                 history of Orthostatic hypotension and cancer.  Sonographer:    Darlina Sicilian RDCS Referring Phys: [phone] [facility]  Sonographer Comments:  Echo performed with patient supine and on artificial respirator. IMPRESSIONS  1. Left ventricular ejection fraction, by estimation, is 55 to 60%. The left ventricle has normal function. The left ventricle has no regional wall motion abnormalities. There is mild left ventricular hypertrophy. Left ventricular diastolic parameters are indeterminate.  2. Right ventricular systolic function is moderately reduced. The right ventricular size is moderately enlarged.  3. A small pericardial effusion is present. The pericardial effusion is circumferential. There is no evidence of cardiac tamponade.  4. The mitral valve was not well visualized. No evidence of mitral valve regurgitation. No evidence of mitral stenosis.  5. The aortic valve is tricuspid. Aortic valve regurgitation is not visualized. No aortic stenosis is present.  6. The inferior vena cava is dilated in size with <50% respiratory variability, suggesting right atrial pressure of 15 mmHg.  7. Technically difficult study FINDINGS  Left Ventricle: Left ventricular ejection fraction, by estimation, is 55 to 60%. The left ventricle has normal function. The left ventricle has no regional wall motion abnormalities. Definity contrast agent was given IV to delineate the left ventricular  endocardial borders. The left ventricular internal cavity size was normal in size. There is mild left ventricular hypertrophy. Left ventricular diastolic parameters are indeterminate. Right Ventricle: The right ventricular size  is moderately enlarged. Right vetricular wall thickness was not well visualized. Right ventricular systolic function is moderately reduced. Left Atrium: Left atrial size was not well visualized. Right Atrium: Right atrial size was not well visualized. Pericardium: A small pericardial effusion is present. The pericardial effusion is circumferential. There is no evidence of cardiac tamponade. Mitral Valve: The mitral valve was not well visualized. No evidence of  mitral valve regurgitation. No evidence of mitral valve stenosis. Tricuspid Valve: The tricuspid valve is not well visualized. Tricuspid valve regurgitation is trivial. No evidence of tricuspid stenosis. Aortic Valve: The aortic valve is tricuspid. Aortic valve regurgitation is not visualized. No aortic stenosis is present. Pulmonic Valve: The pulmonic valve was not well visualized. Pulmonic valve regurgitation is not visualized. No evidence of pulmonic stenosis. Aorta: The aortic root is normal in size and structure. Venous: The inferior vena cava is dilated in size with less than 50% respiratory variability, suggesting right atrial pressure of 15 mmHg. IAS/Shunts: The interatrial septum was not well visualized.  LEFT VENTRICLE PLAX 2D LVIDd:         5.44 cm      Diastology LVIDs:         3.73 cm      LV e' medial:    3.71 cm/s LV PW:         1.19 cm      LV E/e' medial:  18.4 LV IVS:        1.25 cm      LV e' lateral:   5.51 cm/s LVOT diam:     2.30 cm      LV E/e' lateral: 12.4 LV SV:         81 LV SV Index:   35 LVOT Area:     4.15 cm  LV Volumes (MOD) LV vol d, MOD A2C: 134.0 ml LV vol d, MOD A4C: 150.0 ml LV vol s, MOD A2C: 91.4 ml LV vol s, MOD A4C: 123.0 ml LV SV MOD A2C:     42.6 ml LV SV MOD A4C:     150.0 ml LV SV MOD BP:      31.8 ml LEFT ATRIUM           Index       RIGHT ATRIUM           Index LA diam:      3.50 cm 1.50 cm/m  RA Area:     25.00 cm LA Vol (A2C): 35.6 ml 15.21 ml/m RA Volume:   81.00 ml  34.60 ml/m LA Vol (A4C): 81.2 ml 34.69 ml/m  AORTIC VALVE LVOT Vmax:   95.30 cm/s LVOT Vmean:  62.700 cm/s LVOT VTI:    0.196 m  AORTA Ao Root diam: 3.60 cm MITRAL VALVE               TRICUSPID VALVE MV Area (PHT): 2.78 cm    TR Peak grad:   24.4 mmHg MV Decel Time: 273 msec    TR Vmax:        247.00 cm/s MV E velocity: 68.10 cm/s MV A velocity: 75.00 cm/s  SHUNTS MV E/A ratio:  0.91        Systemic VTI:  0.20 m                            Systemic Diam: 2.30 cm Carlyle Dolly MD Electronically  signed by Carlyle Dolly MD Signature Date/Time: 05/26/2020/4:03:26 PM  Final         Discharge Exam: Vitals:   06/07/20 0823 06/07/20 0950  BP:  101/70  Pulse:  91  Resp:  18  Temp:    SpO2: 92% 92%   Vitals:   06/07/20 0400 06/07/20 0506 06/07/20 0823 06/07/20 0950  BP:  102/76  101/70  Pulse:  96  91  Resp:  16  18  Temp:  97.9 F (36.6 C)    TempSrc:  Oral    SpO2:  91% 92% 92%  Weight: 98.5 kg     Height:        General: Pt is alert, awake, not in acute distress Cardiovascular: RRR, S1/S2 +, no rubs, no gallops Respiratory: fine bibasilar crackles. No wheeze Abdominal: Soft, NT, ND, bowel sounds + Extremities: trace LE edema, no cyanosis   The results of significant diagnostics from this hospitalization (including imaging, microbiology, ancillary and laboratory) are listed below for reference.    Significant Diagnostic Studies: CT HEAD WO CONTRAST  Result Date: 05/25/2020 CLINICAL DATA:  Mental status change EXAM: CT HEAD WITHOUT CONTRAST TECHNIQUE: Contiguous axial images were obtained from the base of the skull through the vertex without intravenous contrast. COMPARISON:  05/25/2019 MRI head. FINDINGS: Brain: No acute infarct or intracranial hemorrhage. No mass lesion. No midline shift, ventriculomegaly or extra-axial fluid collection. Vascular: Right temporal calcifications likely correspond to known dural arteriovenous malformation, better demonstrated on prior MRI. Bilateral carotid siphon atherosclerotic calcifications. Skull: Negative for fracture or focal lesion. Sinuses/Orbits: Normal orbits. Mild pansinus mucosal thickening. No mastoid effusion. Other: None. IMPRESSION: No acute infarct or intracranial hemorrhage. Sequela of right temporal dural AVM. Electronically Signed   By: Primitivo Gauze M.D.   On: 05/25/2020 14:17   DG CHEST PORT 1 VIEW  Result Date: 06/05/2020 CLINICAL DATA:  Acute CHF. EXAM: PORTABLE CHEST 1 VIEW COMPARISON:  05/29/2020.  FINDINGS: Cardiomegaly. Mild pulmonary venous congestion. Interim improvement of bilateral interstitial prominence. Persistent bibasilar atelectasis/infiltrates. Small right pleural effusion. No pneumothorax. Carotid vascular calcification. IMPRESSION: 1. Cardiomegaly with mild pulmonary venous congestion. Interim improvement of bilateral interstitial prominence. Persistent bibasilar atelectasis/infiltrates. Small right pleural effusion. 2.  Carotid vascular disease. Electronically Signed   By: Marcello Moores  Register   On: 06/05/2020 14:16   DG CHEST PORT 1 VIEW  Result Date: 05/29/2020 CLINICAL DATA:  CHF exacerbation. EXAM: PORTABLE CHEST 1 VIEW COMPARISON:  05/28/2020. FINDINGS: Interim extubation and removal of NG tube. Cardiomegaly. Diffuse bilateral interstitial prominence again noted. Interstitial edema and/or pneumonitis could present in this fashion. Persistent bibasilar atelectasis. No prominent pleural effusion. No pneumothorax. Degenerative change thoracic spine. IMPRESSION: 1. Interim extubation removal of NG tube. 2. Severe cardiomegaly again noted. Diffuse bilateral interstitial prominence again noted. Interstitial edema and/or pneumonitis could present in this fashion. Persistent bibasilar atelectasis. Similar findings noted on prior exam. Electronically Signed   By: [place]   On: 05/29/2020 12:42   DG CHEST PORT 1 VIEW  Result Date: 05/28/2020 CLINICAL DATA:  Orogastric tube placement. EXAM: PORTABLE CHEST 1 VIEW COMPARISON:  Chest radiographs o from the same day FINDINGS: ET tube approximately 4.8 cm above the carina. Orogastric tube courses below the diaphragm with the tip outside the field of view. The side port is below the GE junction. Similar enlargement of the cardiac silhouette. Similar small bilateral pleural effusions. No discernible pneumothorax. Similar vascular congestion. IMPRESSION: 1. Orogastric tube courses below the diaphragm with the tip outside the field of view. 2.  Similar pulmonary vascular congestion and possible small  bilateral pleural effusions. Electronically Signed   By: Margaretha Sheffield MD   On: 05/28/2020 20:14   DG CHEST PORT 1 VIEW  Result Date: 05/28/2020 CLINICAL DATA:  Acute respiratory failure and OG tube placement EXAM: PORTABLE CHEST 1 VIEW COMPARISON:  Radiograph 05/25/2020 FINDINGS: Tip of the transesophageal tube terminates in the left upper quadrant with the side port closely approximating the expected location of the GE junction. Could consider advancing 3-5 cm for optimal positioning. Endotracheal tube tip terminates in the mid trachea, 3.2 cm from the carina. Telemetry leads overlie the chest. Surgical clip projects over the right heart border. Bilateral pleural effusions, right greater than left with adjacent opacities, likely passive atelectasis. Some diffuse hazy interstitial opacity is central pulmonary vascular congestion is noted as well. Cardiomediastinal contours are similar to prior counting for differences in technique. The aorta is calcified. The remaining cardiomediastinal contours are unremarkable. IMPRESSION: Tip of the transesophageal tube terminates in the left upper quadrant with the side port closely approximating the expected location of the GE junction. Could consider advancing 3-5 cm for optimal positioning. Stable satisfactory positioning of the endotracheal tube. Features suggesting CHF/volume overload with cardiomegaly, vascular congestion, bilateral effusions and interstitial edema. Opacities adjacent the pleural effusions likely passive atelectatic change though underlying consolidation is not excluded. Electronically Signed   By: Lovena Le M.D.   On: 05/28/2020 00:44   DG CHEST PORT 1 VIEW  Result Date: 05/26/2020 CLINICAL DATA:  Dizziness after a fall yesterday. History of prostate cancer and COPD. Smoker. EXAM: PORTABLE CHEST 1 VIEW COMPARISON:  05/25/2020 FINDINGS: Enteric and endotracheal tubes are unchanged in  position since prior study. Shallow inspiration. Diffuse cardiac enlargement. Bilateral interstitial pattern to the lungs consistent with edema. Bilateral pleural effusions. Calcification of the aorta. IMPRESSION: Cardiac enlargement with bilateral interstitial edema and bilateral pleural effusions. Appliances are unchanged in position. Electronically Signed   By: Lucienne Capers M.D.   On: 05/26/2020 06:24   DG Chest Portable 1 View  Result Date: 05/25/2020 CLINICAL DATA:  Shortness of breath.  Intubation. EXAM: PORTABLE CHEST 1 VIEW COMPARISON:  05/25/2020 chest radiograph FINDINGS: An endotracheal tube is identified with tip 4.5 cm above the carina. An NG tube is present entering the UPPER abdomen with tip off the field of view. Pulmonary vascular congestion and possible bilateral trace pleural effusions again noted. No other changes identified. IMPRESSION: 1. Endotracheal tube with tip 4.5 cm above the carina. NG tube placement. 2. Pulmonary vascular congestion and possible trace bilateral pleural effusions. Electronically Signed   By: Margarette Canada M.D.   On: 05/25/2020 11:43   DG Chest Port 1 View  Result Date: 05/25/2020 CLINICAL DATA:  69 year old male with acute shortness of breath. EXAM: PORTABLE CHEST 1 VIEW COMPARISON:  None. FINDINGS: Cardiomegaly identified. Pulmonary vascular congestion is present with possible trace effusions. There is no evidence of pneumothorax, mass or consolidation. No acute bony abnormalities are present. IMPRESSION: Cardiomegaly with pulmonary vascular congestion and possible trace effusions. Electronically Signed   By: Margarette Canada M.D.   On: 05/25/2020 09:24   ECHOCARDIOGRAM COMPLETE  Result Date: 05/26/2020    ECHOCARDIOGRAM REPORT   Patient Name:   Steven Howard Date of Exam: 05/26/2020 Medical Rec #:  [id number]      Height:       72.0 in Accession #:    [id number]     Weight:       249.6 lb Date of Birth:  1951-01-12      BSA:  2.341 m Patient Age:    64  years       BP:           105/54 mmHg Patient Gender: M              HR:           66 bpm. Exam Location:  [facility] Procedure: 2D Echo and Intracardiac Opacification Agent Indications:    Dyspnea 786.09 / R06.00  History:        Patient has no prior history of Echocardiogram examinations.                 CHF, COPD; Risk Factors:Current Smoker and Sleep Apnea. Past                 history of Orthostatic hypotension and cancer.  Sonographer:    Darlina Sicilian RDCS Referring Phys: [phone] [facility]  Sonographer Comments: Echo performed with patient supine and on artificial respirator. IMPRESSIONS  1. Left ventricular ejection fraction, by estimation, is 55 to 60%. The left ventricle has normal function. The left ventricle has no regional wall motion abnormalities. There is mild left ventricular hypertrophy. Left ventricular diastolic parameters are indeterminate.  2. Right ventricular systolic function is moderately reduced. The right ventricular size is moderately enlarged.  3. A small pericardial effusion is present. The pericardial effusion is circumferential. There is no evidence of cardiac tamponade.  4. The mitral valve was not well visualized. No evidence of mitral valve regurgitation. No evidence of mitral stenosis.  5. The aortic valve is tricuspid. Aortic valve regurgitation is not visualized. No aortic stenosis is present.  6. The inferior vena cava is dilated in size with <50% respiratory variability, suggesting right atrial pressure of 15 mmHg.  7. Technically difficult study FINDINGS  Left Ventricle: Left ventricular ejection fraction, by estimation, is 55 to 60%. The left ventricle has normal function. The left ventricle has no regional wall motion abnormalities. Definity contrast agent was given IV to delineate the left ventricular  endocardial borders. The left ventricular internal cavity size was normal in size. There is mild left ventricular hypertrophy. Left ventricular diastolic parameters  are indeterminate. Right Ventricle: The right ventricular size is moderately enlarged. Right vetricular wall thickness was not well visualized. Right ventricular systolic function is moderately reduced. Left Atrium: Left atrial size was not well visualized. Right Atrium: Right atrial size was not well visualized. Pericardium: A small pericardial effusion is present. The pericardial effusion is circumferential. There is no evidence of cardiac tamponade. Mitral Valve: The mitral valve was not well visualized. No evidence of mitral valve regurgitation. No evidence of mitral valve stenosis. Tricuspid Valve: The tricuspid valve is not well visualized. Tricuspid valve regurgitation is trivial. No evidence of tricuspid stenosis. Aortic Valve: The aortic valve is tricuspid. Aortic valve regurgitation is not visualized. No aortic stenosis is present. Pulmonic Valve: The pulmonic valve was not well visualized. Pulmonic valve regurgitation is not visualized. No evidence of pulmonic stenosis. Aorta: The aortic root is normal in size and structure. Venous: The inferior vena cava is dilated in size with less than 50% respiratory variability, suggesting right atrial pressure of 15 mmHg. IAS/Shunts: The interatrial septum was not well visualized.  LEFT VENTRICLE PLAX 2D LVIDd:         5.44 cm      Diastology LVIDs:         3.73 cm      LV e' medial:    3.71 cm/s LV PW:  1.19 cm      LV E/e' medial:  18.4 LV IVS:        1.25 cm      LV e' lateral:   5.51 cm/s LVOT diam:     2.30 cm      LV E/e' lateral: 12.4 LV SV:         81 LV SV Index:   35 LVOT Area:     4.15 cm  LV Volumes (MOD) LV vol d, MOD A2C: 134.0 ml LV vol d, MOD A4C: 150.0 ml LV vol s, MOD A2C: 91.4 ml LV vol s, MOD A4C: 123.0 ml LV SV MOD A2C:     42.6 ml LV SV MOD A4C:     150.0 ml LV SV MOD BP:      31.8 ml LEFT ATRIUM           Index       RIGHT ATRIUM           Index LA diam:      3.50 cm 1.50 cm/m  RA Area:     25.00 cm LA Vol (A2C): 35.6 ml 15.21 ml/m  RA Volume:   81.00 ml  34.60 ml/m LA Vol (A4C): 81.2 ml 34.69 ml/m  AORTIC VALVE LVOT Vmax:   95.30 cm/s LVOT Vmean:  62.700 cm/s LVOT VTI:    0.196 m  AORTA Ao Root diam: 3.60 cm MITRAL VALVE               TRICUSPID VALVE MV Area (PHT): 2.78 cm    TR Peak grad:   24.4 mmHg MV Decel Time: 273 msec    TR Vmax:        247.00 cm/s MV E velocity: 68.10 cm/s MV A velocity: 75.00 cm/s  SHUNTS MV E/A ratio:  0.91        Systemic VTI:  0.20 m                            Systemic Diam: 2.30 cm Carlyle Dolly MD Electronically signed by Carlyle Dolly MD Signature Date/Time: 05/26/2020/4:03:26 PM    Final      Microbiology: Recent Results (from the past 240 hour(s))  SARS Coronavirus 2 by RT PCR (hospital order, performed in [place] hospital lab) Nasopharyngeal Nasopharyngeal Swab     Status: None   Collection Time: 06/03/20 11:00 AM   Specimen: Nasopharyngeal Swab  Result Value Ref Range Status   SARS Coronavirus 2 NEGATIVE NEGATIVE Final    Comment: (NOTE) SARS-CoV-2 target nucleic acids are NOT DETECTED.  The SARS-CoV-2 RNA is generally detectable in upper and lower respiratory specimens during the acute phase of infection. The lowest concentration of SARS-CoV-2 viral copies this assay can detect is 250 copies / mL. A negative result does not preclude SARS-CoV-2 infection and should not be used as the sole basis for treatment or other patient management decisions.  A negative result may occur with improper specimen collection / handling, submission of specimen other than nasopharyngeal swab, presence of viral mutation(s) within the areas targeted by this assay, and inadequate number of viral copies (<250 copies / mL). A negative result must be combined with clinical observations, patient history, and epidemiological information.  Fact Sheet for Patients:   StrictlyIdeas.no  Fact Sheet for Healthcare Providers: BankingDealers.co.za  This  test is not yet approved or  cleared by the [place] FDA and has been authorized for detection and/or diagnosis  of SARS-CoV-2 by FDA under an Emergency Use Authorization (EUA).  This EUA will remain in effect (meaning this test can be used) for the duration of the COVID-19 declaration under Section 564(b)(1) of the Act, 21 U.S.C. section 360bbb-3(b)(1), unless the authorization is terminated or revoked sooner.  Performed at [facility], [address] [place], [place] [address]   Respiratory Panel by RT PCR (Flu A&B, Covid) - Nasopharyngeal Swab     Status: None   Collection Time: 06/06/20 10:46 AM   Specimen: Nasopharyngeal Swab  Result Value Ref Range Status   SARS Coronavirus 2 by RT PCR NEGATIVE NEGATIVE Final    Comment: (NOTE) SARS-CoV-2 target nucleic acids are NOT DETECTED.  The SARS-CoV-2 RNA is generally detectable in upper respiratoy specimens during the acute phase of infection. The lowest concentration of SARS-CoV-2 viral copies this assay can detect is 131 copies/mL. A negative result does not preclude SARS-Cov-2 infection and should not be used as the sole basis for treatment or other patient management decisions. A negative result may occur with  improper specimen collection/handling, submission of specimen other than nasopharyngeal swab, presence of viral mutation(s) within the areas targeted by this assay, and inadequate number of viral copies (<131 copies/mL). A negative result must be combined with clinical observations, patient history, and epidemiological information. The expected result is Negative.  Fact Sheet for Patients:  PinkCheek.be  Fact Sheet for Healthcare Providers:  GravelBags.it  This test is no t yet approved or cleared by the [place] FDA and  has been authorized for detection and/or diagnosis of SARS-CoV-2 by FDA under an Emergency Use Authorization (EUA). This EUA will  remain  in effect (meaning this test can be used) for the duration of the COVID-19 declaration under Section 564(b)(1) of the Act, 21 U.S.C. section 360bbb-3(b)(1), unless the authorization is terminated or revoked sooner.     Influenza A by PCR NEGATIVE NEGATIVE Final   Influenza B by PCR NEGATIVE NEGATIVE Final    Comment: (NOTE) The Xpert Xpress SARS-CoV-2/FLU/RSV assay is intended as an aid in  the diagnosis of influenza from Nasopharyngeal swab specimens and  should not be used as a sole basis for treatment. Nasal washings and  aspirates are unacceptable for Xpert Xpress SARS-CoV-2/FLU/RSV  testing.  Fact Sheet for Patients: PinkCheek.be  Fact Sheet for Healthcare Providers: GravelBags.it  This test is not yet approved or cleared by the [place] FDA and  has been authorized for detection and/or diagnosis of SARS-CoV-2 by  FDA under an Emergency Use Authorization (EUA). This EUA will remain  in effect (meaning this test can be used) for the duration of the  Covid-19 declaration under Section 564(b)(1) of the Act, 21  U.S.C. section 360bbb-3(b)(1), unless the authorization is  terminated or revoked. Performed at [facility], [address] [place], [place] [address]      Labs: Basic Metabolic Panel: Recent Labs  Lab 06/01/20 0600 06/01/20 0600 06/04/20 0847 06/04/20 0847 06/05/20 0603 06/05/20 0603 06/06/20 0722 06/07/20 0631  NA 141  --  137  --  136  --  135 136  K 3.7   < > 3.9   < > 4.0   < > 3.8 3.6  CL 94*  --  88*  --  90*  --  91* 92*  CO2 39*  --  41*  --  38*  --  36* 35*  GLUCOSE 90  --  73  --  85  --  87 91  BUN  20  --  21  --  19  --  17 18  CREATININE 0.51*  --  0.54*  --  0.59*  --  0.61 0.55*  CALCIUM 9.0  --  9.1  --  9.2  --  9.2 9.4  MG 1.9  --  1.7  --  1.7  --  2.1 2.0   < > = values in this interval not displayed.   Liver Function Tests: No results for input(s): AST, ALT,  ALKPHOS, BILITOT, PROT, ALBUMIN in the last 168 hours. No results for input(s): LIPASE, AMYLASE in the last 168 hours. No results for input(s): AMMONIA in the last 168 hours. CBC: Recent Labs  Lab 06/01/20 0600 06/06/20 0722  WBC 6.4 5.8  HGB 15.8 16.1  HCT 51.5 50.3  MCV 101.4* 96.5  PLT 211 173   Cardiac Enzymes: No results for input(s): CKTOTAL, CKMB, CKMBINDEX, TROPONINI in the last 168 hours. BNP: Invalid input(s): POCBNP CBG: Recent Labs  Lab 06/04/20 2132  GLUCAP 153*    Time coordinating discharge:  36 minutes  Signed:  Orson Eva, DO [facility] Pager: [phone] 06/07/2020, 11:42 AM

## 2020-06-06 NOTE — Care Management Important Message (Signed)
Important Message  Patient Details  Name: Steven Howard MRN: [id number] Date of Birth: Dec 29, 1950   Medicare Important Message Given:  Yes     Tommy Medal 06/06/2020, 4:16 PM

## 2020-06-07 LAB — BASIC METABOLIC PANEL
Anion gap: 9 (ref 5–15)
BUN: 18 mg/dL (ref 8–23)
CO2: 35 mmol/L — ABNORMAL HIGH (ref 22–32)
Calcium: 9.4 mg/dL (ref 8.9–10.3)
Chloride: 92 mmol/L — ABNORMAL LOW (ref 98–111)
Creatinine, Ser: 0.55 mg/dL — ABNORMAL LOW (ref 0.61–1.24)
GFR, Estimated: >60 mL/min (ref >60)
Glucose, Bld: 91 mg/dL (ref 70–99)
Potassium: 3.6 mmol/L (ref 3.5–5.1)
Sodium: 136 mmol/L (ref 135–145)

## 2020-06-07 LAB — MAGNESIUM: Magnesium: 2 mg/dL (ref 1.7–2.4)

## 2020-06-07 MED ORDER — METOPROLOL TARTRATE 25 MG PO TABS: 12.5000 mg | ORAL_TABLET | Freq: Two times a day (BID) | ORAL | 0 refills | Status: DC

## 2020-06-07 MED ORDER — FUROSEMIDE 40 MG PO TABS
40.0000 mg | ORAL_TABLET | Freq: Every day | ORAL | 0 refills | Status: DC
Start: 2020-06-08 — End: 2020-12-23

## 2020-06-07 NOTE — TOC Transition Note (Signed)
Transition of Care [facility]) - CM/SW Discharge Note   Patient Details  Name: Steven Howard MRN: [id number] Date of Birth: 31-Oct-1950  [facility] CM/SW Contact:  Natasha Bence, LCSW Phone Number: 06/07/2020, 11:41 AM   Clinical Narrative:    CSW contacted [facility] to verify bed availability.  [name] with [facility] confirmed that they are able to take patient today. CSW faxed d/c summary and SNF transfer report to [phone]. CSW also called EMS and completed med necessity form. Nurse to call report TOC signing off.    Final next level of care: Skilled Nursing Facility Barriers to Discharge: Barriers Resolved   Patient Goals and CMS Choice Patient states their goals for this hospitalization and ongoing recovery are:: Rehab with SNF CMS Medicare.gov Compare Post Acute Care list provided to:: Patient Choice offered to / list presented to : Patient  Discharge Placement                Patient to be transferred to facility by: [facility] Name of family member notified: Charleston Ropes [place] Patient and family notified of of transfer: 06/07/20  Discharge Plan and Services                                     Social Determinants of Health (SDOH) Interventions     Readmission Risk Interventions Readmission Risk Prevention Plan 06/07/2020  Transportation Screening Complete  PCP or Specialist Appt within 5-7 Days Complete  Home Care Screening Complete  Medication Review (RN CM) Complete  Some recent data might be hidden

## 2020-06-07 NOTE — Progress Notes (Signed)
Pt resting quietly in recliner. Denies c/o. Resp even and nonlabored. Pt continues to wait for EMS transport to [facility]. Pt and his sister Sandford Craze updated on pt transport status.

## 2020-06-07 NOTE — Progress Notes (Signed)
Second attempt to call report to [facility]. Nurse states she has not received copy of discharge summary and will not take report until she does. SW states paperwork was faxed this am to [phone]. Nurse states not on fax machine. Verified that this was the correct fax number and our secretary will refax info and I will try for a third time to call pt report.

## 2020-06-07 NOTE — Progress Notes (Signed)
Report given to [facility], LPN at [facility]. Awaiting EMS transport.

## 2020-06-07 NOTE — Progress Notes (Signed)
Attempted to call report to [facility], informed that pt's nurse is out on lunch break at this time. Pt's sister Charleston Ropes updated on pt's planned discharge today as well.

## 2020-06-07 NOTE — Progress Notes (Signed)
Third attempt to call patient report to [facility] made but no one is answered phone at the facility. Fourth attempt made and nurse [place] states still has not received discharge summary. Refuses to take patient report. Requested to speak with manager, nurse states she has already talked with the manager and they are contacting SW here. SW Bria texted this nurse and stated that info was being refaxed from ICU fax machine at this time. [facility] nurse states she will call me when she receives the discharge summary. [facility] notified of issue.

## 2020-06-08 DIAGNOSIS — J9601 Acute respiratory failure with hypoxia: Secondary | ICD-10-CM | POA: Diagnosis not present

## 2020-06-08 DIAGNOSIS — I498 Other specified cardiac arrhythmias: Secondary | ICD-10-CM | POA: Diagnosis not present

## 2020-06-08 DIAGNOSIS — J441 Chronic obstructive pulmonary disease with (acute) exacerbation: Secondary | ICD-10-CM | POA: Diagnosis not present

## 2020-06-08 DIAGNOSIS — J9602 Acute respiratory failure with hypercapnia: Secondary | ICD-10-CM | POA: Diagnosis not present

## 2020-06-08 DIAGNOSIS — E871 Hypo-osmolality and hyponatremia: Secondary | ICD-10-CM | POA: Diagnosis not present

## 2020-06-08 DIAGNOSIS — I739 Peripheral vascular disease, unspecified: Secondary | ICD-10-CM | POA: Diagnosis not present

## 2020-06-08 DIAGNOSIS — G9341 Metabolic encephalopathy: Secondary | ICD-10-CM | POA: Diagnosis not present

## 2020-06-08 DIAGNOSIS — E2740 Unspecified adrenocortical insufficiency: Secondary | ICD-10-CM | POA: Diagnosis not present

## 2020-06-08 DIAGNOSIS — Q049 Congenital malformation of brain, unspecified: Secondary | ICD-10-CM | POA: Diagnosis not present

## 2020-06-08 DIAGNOSIS — Z720 Tobacco use: Secondary | ICD-10-CM | POA: Diagnosis not present

## 2020-06-08 DIAGNOSIS — I5030 Unspecified diastolic (congestive) heart failure: Secondary | ICD-10-CM | POA: Diagnosis not present

## 2020-06-08 DIAGNOSIS — G4733 Obstructive sleep apnea (adult) (pediatric): Secondary | ICD-10-CM | POA: Diagnosis not present

## 2020-06-08 DIAGNOSIS — J9691 Respiratory failure, unspecified with hypoxia: Secondary | ICD-10-CM | POA: Diagnosis not present

## 2020-06-08 DIAGNOSIS — N179 Acute kidney failure, unspecified: Secondary | ICD-10-CM | POA: Diagnosis not present

## 2020-06-08 DIAGNOSIS — I5031 Acute diastolic (congestive) heart failure: Secondary | ICD-10-CM | POA: Diagnosis not present

## 2020-06-08 MED ORDER — POTASSIUM CHLORIDE CRYS ER 20 MEQ PO TBCR: 20.0000 meq | EXTENDED_RELEASE_TABLET | Freq: Every day | ORAL | Status: DC

## 2020-06-08 NOTE — Discharge Summary (Signed)
Physician Discharge Summary  Steven Howard NWG:[id number] DOB: [birth year] DOA: 05/25/2020  PCP: Claretta Fraise, MD  Admit date: 05/25/2020 Discharge date: 06/08/2020  Admitted From: Home Disposition:  SNF  Recommendations for Outpatient Follow-up:  1. Follow up with PCP in 1-2 weeks 2. Please obtain BMP/CBC in one week 3. Keep patient on 2L [place], wean to RA for saturation >92%    Discharge Condition: Stable CODE STATUS: FULL Diet recommendation: Heart Healthy    Brief/Interim Summary: 69 year old male with a history of alcohol and tobacco abuse, COPD, tobacco user, OSA, recent urethral dilatation, right temporal AVM, orthostatic hypotension presenting with altered mental status after a fall at home. In the emergency department, the patient was found to be hypercapnic withABG 7.15/103/140 on 100% FiO2 after 2 hours of BiPAP and required mechanical ventilation.He was intubated on 03/24/2020. PCCM was consulted to assist with management. He was started on IV steroids, bronchodilators, and IV Lasix for acute diastolic CHF and COPD exacerbation. BNP was 455 with sodium 128 and serum creatinine 1.49. The patient was subsequently extubated on 05/29/2020. During the hospitalization, the patient was intermittently confused, but did not interferewith medical therapy. He was subsequently weaned off of intravenous steroids to oral steroids. Echocardiogram showed EF 55-60% with indeterminate diastolic function. Physical therapy was consulted and recommended skilled nursing facility.  Discharge Diagnoses:  Acute respiratory failure with hypoxia and hypercapnia -Secondary COPD exacerbation and acute diastolic CHF -Currently stable on 2L nasal cannula>>95-97% -Wean oxygen for saturation 88-92% -Personally reviewed chest x-ray--increased interstitial markings -repeat CXR-personally reviewed. Improving interstitial markings  Acute diastolic CHF -5/78 echo EF 55 to 60%, indeterminate  diastolic function, no WMA, moderate decreased RV function, small pericardial effusion -continueIV furosemide>>po lasix -Daily weights -Accurate I's and O's--incomplete -start low dose metoprolol tartrate  COPD exacerbation -No wheezing presently -Continue prednisone taper (40 mg daily presently)-->taper as noted in meds section -Restart bronchodilators-->d/c with Breo and duonebs prn -StartedPulmicort during hospitalization  Acute kidney injury -Baseline creatinine 0.5-0.7 -Serum creatinine peaked 1.49 -Secondary to hemodynamic changes -serum creatinine 0.55 at time of d/c  Acute metabolic encephalopathy -Secondary to respiratory failure with hypercapnia -Remains intermittently confused although much improved -Suspect the patient may have a degree of underlying cognitive impairment -May benefit from outpatient neuropsychiatric testing -Check I69--[id number] -Folic BMWU--13.2 -GMW--1.027 -CT brain negative -improved.  Now at baseline  Dysrhythmia -place on tele--reviewed--sinus with PACs -personally reviewedEKG--sinus, no STT changes  Hyponatremia -Secondary to CHF -Improved with diuresis  Hypokalemia -Repleted -mag--2.1  History of dural arteriovenous malformation of brain -Patient was recommended image guided diagnostic cerebral arteriogram with intervention by IR, but refused  Low am cortisol -already on prednisone -am cortisol 4.4 -may ultimately need cortrosyn stimulation test as outpt    Discharge Instructions  Discharge Instructions    Ambulatory referral to Urology   Complete by: As directed    Missed appt with [place] while in hospital     Allergies as of 06/08/2020   No Known Allergies     Medication List    TAKE these medications   albuterol 108 (90 Base) MCG/ACT inhaler Commonly known as: VENTOLIN HFA Inhale 2 puffs into the lungs every 4 (four) hours as needed for wheezing or shortness of breath (cough, shortness of breath or  wheezing.).   Breo Ellipta 100-25 MCG/INH Aepb Generic drug: fluticasone furoate-vilanterol INHALE 1 PUFF INTO LUNGS DAILY What changed: See the new instructions.   docusate sodium 100 MG capsule Commonly known as: COLACE Take 1 capsule (100 mg total) by mouth  daily.   furosemide 40 MG tablet Commonly known as: LASIX Take 1 tablet (40 mg total) by mouth daily.   ipratropium-albuterol 0.5-2.5 (3) MG/3ML Soln Commonly known as: DUONEB Take 3 mLs by nebulization every 4 (four) hours as needed.   metoprolol tartrate 25 MG tablet Commonly known as: LOPRESSOR Take 0.5 tablets (12.5 mg total) by mouth 2 (two) times daily.   multivitamin with minerals Tabs tablet Take 1 tablet by mouth daily.   nicotine 21 mg/24hr patch Commonly known as: NICODERM CQ - dosed in mg/24 hours Place 1 patch (21 mg total) onto the skin daily as needed (nicotine cravings).   polyethylene glycol 17 g packet Commonly known as: MIRALAX / GLYCOLAX Take 17 g by mouth daily as needed for mild constipation.   potassium chloride SA 20 MEQ tablet Commonly known as: KLOR-CON Take 1 tablet (20 mEq total) by mouth daily.   predniSONE 20 MG tablet Commonly known as: DELTASONE Take 2 tabs daily with breakfast x 3 days, then 1 tab daily x 7 days       Contact information for follow-up providers    Claretta Fraise, MD. Schedule an appointment as soon as possible for a visit in 2 week(s).   Specialty: Family Medicine Contact information: [place] [address] [phone]        Minus Breeding, MD. Schedule an appointment as soon as possible for a visit in 1 month(s).   Specialty: Cardiology Contact information: [place] [address] [phone]        Cleon Gustin, MD. Go on 06/05/2020.   Specialty: Urology Why: As already scheduled for Urology Follow Up appointment  Contact information: [place] [address] [place] [address] [phone]             Contact information for after-discharge care    [facility] .   Service: Skilled Nursing Contact information: [address] [place] [phone]                 No Known Allergies  Consultations:  none   Procedures/Studies: CT HEAD WO CONTRAST  Result Date: 05/25/2020 CLINICAL DATA:  Mental status change EXAM: CT HEAD WITHOUT CONTRAST TECHNIQUE: Contiguous axial images were obtained from the base of the skull through the vertex without intravenous contrast. COMPARISON:  05/25/2019 MRI head. FINDINGS: Brain: No acute infarct or intracranial hemorrhage. No mass lesion. No midline shift, ventriculomegaly or extra-axial fluid collection. Vascular: Right temporal calcifications likely correspond to known dural arteriovenous malformation, better demonstrated on prior MRI. Bilateral carotid siphon atherosclerotic calcifications. Skull: Negative for fracture or focal lesion. Sinuses/Orbits: Normal orbits. Mild pansinus mucosal thickening. No mastoid effusion. Other: None. IMPRESSION: No acute infarct or intracranial hemorrhage. Sequela of right temporal dural AVM. Electronically Signed   By: Primitivo Gauze M.D.   On: 05/25/2020 14:17   DG CHEST PORT 1 VIEW  Result Date: 06/05/2020 CLINICAL DATA:  Acute CHF. EXAM: PORTABLE CHEST 1 VIEW COMPARISON:  05/29/2020. FINDINGS: Cardiomegaly. Mild pulmonary venous congestion. Interim improvement of bilateral interstitial prominence. Persistent bibasilar atelectasis/infiltrates. Small right pleural effusion. No pneumothorax. Carotid vascular calcification. IMPRESSION: 1. Cardiomegaly with mild pulmonary venous congestion. Interim improvement of bilateral interstitial prominence. Persistent bibasilar atelectasis/infiltrates. Small right pleural effusion. 2.  Carotid vascular disease. Electronically Signed   By: Marcello Moores  Register   On: 06/05/2020 14:16   DG CHEST PORT 1 VIEW  Result  Date: 05/29/2020  CLINICAL DATA:  CHF exacerbation. EXAM: PORTABLE CHEST 1 VIEW COMPARISON:  05/28/2020. FINDINGS: Interim extubation and removal of NG tube. Cardiomegaly. Diffuse bilateral interstitial prominence again noted. Interstitial edema and/or pneumonitis could present in this fashion. Persistent bibasilar atelectasis. No prominent pleural effusion. No pneumothorax. Degenerative change thoracic spine. IMPRESSION: 1. Interim extubation removal of NG tube. 2. Severe cardiomegaly again noted. Diffuse bilateral interstitial prominence again noted. Interstitial edema and/or pneumonitis could present in this fashion. Persistent bibasilar atelectasis. Similar findings noted on prior exam. Electronically Signed   By: Pierce   On: 05/29/2020 12:42   DG CHEST PORT 1 VIEW  Result Date: 05/28/2020 CLINICAL DATA:  Orogastric tube placement. EXAM: PORTABLE CHEST 1 VIEW COMPARISON:  Chest radiographs o from the same day FINDINGS: ET tube approximately 4.8 cm above the carina. Orogastric tube courses below the diaphragm with the tip outside the field of view. The side port is below the GE junction. Similar enlargement of the cardiac silhouette. Similar small bilateral pleural effusions. No discernible pneumothorax. Similar vascular congestion. IMPRESSION: 1. Orogastric tube courses below the diaphragm with the tip outside the field of view. 2. Similar pulmonary vascular congestion and possible small bilateral pleural effusions. Electronically Signed   By: Margaretha Sheffield MD   On: 05/28/2020 20:14   DG CHEST PORT 1 VIEW  Result Date: 05/28/2020 CLINICAL DATA:  Acute respiratory failure and OG tube placement EXAM: PORTABLE CHEST 1 VIEW COMPARISON:  Radiograph 05/25/2020 FINDINGS: Tip of the transesophageal tube terminates in the left upper quadrant with the side port closely approximating the expected location of the GE junction. Could consider advancing 3-5 cm for optimal positioning. Endotracheal tube  tip terminates in the mid trachea, 3.2 cm from the carina. Telemetry leads overlie the chest. Surgical clip projects over the right heart border. Bilateral pleural effusions, right greater than left with adjacent opacities, likely passive atelectasis. Some diffuse hazy interstitial opacity is central pulmonary vascular congestion is noted as well. Cardiomediastinal contours are similar to prior counting for differences in technique. The aorta is calcified. The remaining cardiomediastinal contours are unremarkable. IMPRESSION: Tip of the transesophageal tube terminates in the left upper quadrant with the side port closely approximating the expected location of the GE junction. Could consider advancing 3-5 cm for optimal positioning. Stable satisfactory positioning of the endotracheal tube. Features suggesting CHF/volume overload with cardiomegaly, vascular congestion, bilateral effusions and interstitial edema. Opacities adjacent the pleural effusions likely passive atelectatic change though underlying consolidation is not excluded. Electronically Signed   By: Lovena Le M.D.   On: 05/28/2020 00:44   DG CHEST PORT 1 VIEW  Result Date: 05/26/2020 CLINICAL DATA:  Dizziness after a fall yesterday. History of prostate cancer and COPD. Smoker. EXAM: PORTABLE CHEST 1 VIEW COMPARISON:  05/25/2020 FINDINGS: Enteric and endotracheal tubes are unchanged in position since prior study. Shallow inspiration. Diffuse cardiac enlargement. Bilateral interstitial pattern to the lungs consistent with edema. Bilateral pleural effusions. Calcification of the aorta. IMPRESSION: Cardiac enlargement with bilateral interstitial edema and bilateral pleural effusions. Appliances are unchanged in position. Electronically Signed   By: Lucienne Capers M.D.   On: 05/26/2020 06:24   DG Chest Portable 1 View  Result Date: 05/25/2020 CLINICAL DATA:  Shortness of breath.  Intubation. EXAM: PORTABLE CHEST 1 VIEW COMPARISON:  05/25/2020 chest  radiograph FINDINGS: An endotracheal tube is identified with tip 4.5 cm above the carina. An NG tube is present entering the UPPER abdomen with tip off the field of view. Pulmonary vascular congestion and  possible bilateral trace pleural effusions again noted. No other changes identified. IMPRESSION: 1. Endotracheal tube with tip 4.5 cm above the carina. NG tube placement. 2. Pulmonary vascular congestion and possible trace bilateral pleural effusions. Electronically Signed   By: Margarette Canada M.D.   On: 05/25/2020 11:43   DG Chest Port 1 View  Result Date: 05/25/2020 CLINICAL DATA:  69 year old male with acute shortness of breath. EXAM: PORTABLE CHEST 1 VIEW COMPARISON:  None. FINDINGS: Cardiomegaly identified. Pulmonary vascular congestion is present with possible trace effusions. There is no evidence of pneumothorax, mass or consolidation. No acute bony abnormalities are present. IMPRESSION: Cardiomegaly with pulmonary vascular congestion and possible trace effusions. Electronically Signed   By: Margarette Canada M.D.   On: 05/25/2020 09:24   ECHOCARDIOGRAM COMPLETE  Result Date: 05/26/2020    ECHOCARDIOGRAM REPORT   Patient Name:   Steven Howard Date of Exam: 05/26/2020 Medical Rec #:  [id number]      Height:       72.0 in Accession #:    [id number]     Weight:       249.6 lb Date of Birth:  [birth year]      BSA:          2.341 m Patient Age:    13 years       BP:           105/54 mmHg Patient Gender: M              HR:           66 bpm. Exam Location:  [facility] Procedure: 2D Echo and Intracardiac Opacification Agent Indications:    Dyspnea 786.09 / R06.00  History:        Patient has no prior history of Echocardiogram examinations.                 CHF, COPD; Risk Factors:Current Smoker and Sleep Apnea. Past                 history of Orthostatic hypotension and cancer.  Sonographer:    Darlina Sicilian RDCS Referring Phys: [phone] [facility]  Sonographer Comments: Echo performed with patient supine and on  artificial respirator. IMPRESSIONS  1. Left ventricular ejection fraction, by estimation, is 55 to 60%. The left ventricle has normal function. The left ventricle has no regional wall motion abnormalities. There is mild left ventricular hypertrophy. Left ventricular diastolic parameters are indeterminate.  2. Right ventricular systolic function is moderately reduced. The right ventricular size is moderately enlarged.  3. A small pericardial effusion is present. The pericardial effusion is circumferential. There is no evidence of cardiac tamponade.  4. The mitral valve was not well visualized. No evidence of mitral valve regurgitation. No evidence of mitral stenosis.  5. The aortic valve is tricuspid. Aortic valve regurgitation is not visualized. No aortic stenosis is present.  6. The inferior vena cava is dilated in size with <50% respiratory variability, suggesting right atrial pressure of 15 mmHg.  7. Technically difficult study FINDINGS  Left Ventricle: Left ventricular ejection fraction, by estimation, is 55 to 60%. The left ventricle has normal function. The left ventricle has no regional wall motion abnormalities. Definity contrast agent was given IV to delineate the left ventricular  endocardial borders. The left ventricular internal cavity size was normal in size. There is mild left ventricular hypertrophy. Left ventricular diastolic parameters are indeterminate. Right Ventricle: The right ventricular size is moderately enlarged. Right vetricular wall thickness was not  well visualized. Right ventricular systolic function is moderately reduced. Left Atrium: Left atrial size was not well visualized. Right Atrium: Right atrial size was not well visualized. Pericardium: A small pericardial effusion is present. The pericardial effusion is circumferential. There is no evidence of cardiac tamponade. Mitral Valve: The mitral valve was not well visualized. No evidence of mitral valve regurgitation. No evidence of  mitral valve stenosis. Tricuspid Valve: The tricuspid valve is not well visualized. Tricuspid valve regurgitation is trivial. No evidence of tricuspid stenosis. Aortic Valve: The aortic valve is tricuspid. Aortic valve regurgitation is not visualized. No aortic stenosis is present. Pulmonic Valve: The pulmonic valve was not well visualized. Pulmonic valve regurgitation is not visualized. No evidence of pulmonic stenosis. Aorta: The aortic root is normal in size and structure. Venous: The inferior vena cava is dilated in size with less than 50% respiratory variability, suggesting right atrial pressure of 15 mmHg. IAS/Shunts: The interatrial septum was not well visualized.  LEFT VENTRICLE PLAX 2D LVIDd:         5.44 cm      Diastology LVIDs:         3.73 cm      LV e' medial:    3.71 cm/s LV PW:         1.19 cm      LV E/e' medial:  18.4 LV IVS:        1.25 cm      LV e' lateral:   5.51 cm/s LVOT diam:     2.30 cm      LV E/e' lateral: 12.4 LV SV:         81 LV SV Index:   35 LVOT Area:     4.15 cm  LV Volumes (MOD) LV vol d, MOD A2C: 134.0 ml LV vol d, MOD A4C: 150.0 ml LV vol s, MOD A2C: 91.4 ml LV vol s, MOD A4C: 123.0 ml LV SV MOD A2C:     42.6 ml LV SV MOD A4C:     150.0 ml LV SV MOD BP:      31.8 ml LEFT ATRIUM           Index       RIGHT ATRIUM           Index LA diam:      3.50 cm 1.50 cm/m  RA Area:     25.00 cm LA Vol (A2C): 35.6 ml 15.21 ml/m RA Volume:   81.00 ml  34.60 ml/m LA Vol (A4C): 81.2 ml 34.69 ml/m  AORTIC VALVE LVOT Vmax:   95.30 cm/s LVOT Vmean:  62.700 cm/s LVOT VTI:    0.196 m  AORTA Ao Root diam: 3.60 cm MITRAL VALVE               TRICUSPID VALVE MV Area (PHT): 2.78 cm    TR Peak grad:   24.4 mmHg MV Decel Time: 273 msec    TR Vmax:        247.00 cm/s MV E velocity: 68.10 cm/s MV A velocity: 75.00 cm/s  SHUNTS MV E/A ratio:  0.91        Systemic VTI:  0.20 m                            Systemic Diam: 2.30 cm Carlyle Dolly MD Electronically signed by Carlyle Dolly MD Signature  Date/Time: 05/26/2020/4:03:26 PM    Final  Discharge Exam: Vitals:   06/08/20 0756 06/08/20 0805  BP:    Pulse:    Resp:    Temp:    SpO2: 91% 95%   Vitals:   06/07/20 2114 06/08/20 0508 06/08/20 0756 06/08/20 0805  BP: 116/71 129/80    Pulse: 73 79    Resp: 16 18    Temp: 98.1 F (36.7 C) 97.8 F (36.6 C)    TempSrc:  Oral    SpO2: 99% 97% 91% 95%  Weight:      Height:        General: Pt is alert, awake, not in acute distress Cardiovascular: RRR, S1/S2 +, no rubs, no gallops Respiratory: bibasilar crackles. No wheeze Abdominal: Soft, NT, ND, bowel sounds + Extremities: trace LE edema, no cyanosis   The results of significant diagnostics from this hospitalization (including imaging, microbiology, ancillary and laboratory) are listed below for reference.    Significant Diagnostic Studies: CT HEAD WO CONTRAST  Result Date: 05/25/2020 CLINICAL DATA:  Mental status change EXAM: CT HEAD WITHOUT CONTRAST TECHNIQUE: Contiguous axial images were obtained from the base of the skull through the vertex without intravenous contrast. COMPARISON:  05/25/2019 MRI head. FINDINGS: Brain: No acute infarct or intracranial hemorrhage. No mass lesion. No midline shift, ventriculomegaly or extra-axial fluid collection. Vascular: Right temporal calcifications likely correspond to known dural arteriovenous malformation, better demonstrated on prior MRI. Bilateral carotid siphon atherosclerotic calcifications. Skull: Negative for fracture or focal lesion. Sinuses/Orbits: Normal orbits. Mild pansinus mucosal thickening. No mastoid effusion. Other: None. IMPRESSION: No acute infarct or intracranial hemorrhage. Sequela of right temporal dural AVM. Electronically Signed   By: Primitivo Gauze M.D.   On: 05/25/2020 14:17   DG CHEST PORT 1 VIEW  Result Date: 06/05/2020 CLINICAL DATA:  Acute CHF. EXAM: PORTABLE CHEST 1 VIEW COMPARISON:  05/29/2020. FINDINGS: Cardiomegaly. Mild pulmonary venous  congestion. Interim improvement of bilateral interstitial prominence. Persistent bibasilar atelectasis/infiltrates. Small right pleural effusion. No pneumothorax. Carotid vascular calcification. IMPRESSION: 1. Cardiomegaly with mild pulmonary venous congestion. Interim improvement of bilateral interstitial prominence. Persistent bibasilar atelectasis/infiltrates. Small right pleural effusion. 2.  Carotid vascular disease. Electronically Signed   By: Marcello Moores  Register   On: 06/05/2020 14:16   DG CHEST PORT 1 VIEW  Result Date: 05/29/2020 CLINICAL DATA:  CHF exacerbation. EXAM: PORTABLE CHEST 1 VIEW COMPARISON:  05/28/2020. FINDINGS: Interim extubation and removal of NG tube. Cardiomegaly. Diffuse bilateral interstitial prominence again noted. Interstitial edema and/or pneumonitis could present in this fashion. Persistent bibasilar atelectasis. No prominent pleural effusion. No pneumothorax. Degenerative change thoracic spine. IMPRESSION: 1. Interim extubation removal of NG tube. 2. Severe cardiomegaly again noted. Diffuse bilateral interstitial prominence again noted. Interstitial edema and/or pneumonitis could present in this fashion. Persistent bibasilar atelectasis. Similar findings noted on prior exam. Electronically Signed   By: [place]   On: 05/29/2020 12:42   DG CHEST PORT 1 VIEW  Result Date: 05/28/2020 CLINICAL DATA:  Orogastric tube placement. EXAM: PORTABLE CHEST 1 VIEW COMPARISON:  Chest radiographs o from the same day FINDINGS: ET tube approximately 4.8 cm above the carina. Orogastric tube courses below the diaphragm with the tip outside the field of view. The side port is below the GE junction. Similar enlargement of the cardiac silhouette. Similar small bilateral pleural effusions. No discernible pneumothorax. Similar vascular congestion. IMPRESSION: 1. Orogastric tube courses below the diaphragm with the tip outside the field of view. 2. Similar pulmonary vascular congestion and  possible small bilateral pleural effusions. Electronically Signed   By:  Margaretha Sheffield MD   On: 05/28/2020 20:14   DG CHEST PORT 1 VIEW  Result Date: 05/28/2020 CLINICAL DATA:  Acute respiratory failure and OG tube placement EXAM: PORTABLE CHEST 1 VIEW COMPARISON:  Radiograph 05/25/2020 FINDINGS: Tip of the transesophageal tube terminates in the left upper quadrant with the side port closely approximating the expected location of the GE junction. Could consider advancing 3-5 cm for optimal positioning. Endotracheal tube tip terminates in the mid trachea, 3.2 cm from the carina. Telemetry leads overlie the chest. Surgical clip projects over the right heart border. Bilateral pleural effusions, right greater than left with adjacent opacities, likely passive atelectasis. Some diffuse hazy interstitial opacity is central pulmonary vascular congestion is noted as well. Cardiomediastinal contours are similar to prior counting for differences in technique. The aorta is calcified. The remaining cardiomediastinal contours are unremarkable. IMPRESSION: Tip of the transesophageal tube terminates in the left upper quadrant with the side port closely approximating the expected location of the GE junction. Could consider advancing 3-5 cm for optimal positioning. Stable satisfactory positioning of the endotracheal tube. Features suggesting CHF/volume overload with cardiomegaly, vascular congestion, bilateral effusions and interstitial edema. Opacities adjacent the pleural effusions likely passive atelectatic change though underlying consolidation is not excluded. Electronically Signed   By: Lovena Le M.D.   On: 05/28/2020 00:44   DG CHEST PORT 1 VIEW  Result Date: 05/26/2020 CLINICAL DATA:  Dizziness after a fall yesterday. History of prostate cancer and COPD. Smoker. EXAM: PORTABLE CHEST 1 VIEW COMPARISON:  05/25/2020 FINDINGS: Enteric and endotracheal tubes are unchanged in position since prior study. Shallow  inspiration. Diffuse cardiac enlargement. Bilateral interstitial pattern to the lungs consistent with edema. Bilateral pleural effusions. Calcification of the aorta. IMPRESSION: Cardiac enlargement with bilateral interstitial edema and bilateral pleural effusions. Appliances are unchanged in position. Electronically Signed   By: Lucienne Capers M.D.   On: 05/26/2020 06:24   DG Chest Portable 1 View  Result Date: 05/25/2020 CLINICAL DATA:  Shortness of breath.  Intubation. EXAM: PORTABLE CHEST 1 VIEW COMPARISON:  05/25/2020 chest radiograph FINDINGS: An endotracheal tube is identified with tip 4.5 cm above the carina. An NG tube is present entering the UPPER abdomen with tip off the field of view. Pulmonary vascular congestion and possible bilateral trace pleural effusions again noted. No other changes identified. IMPRESSION: 1. Endotracheal tube with tip 4.5 cm above the carina. NG tube placement. 2. Pulmonary vascular congestion and possible trace bilateral pleural effusions. Electronically Signed   By: Margarette Canada M.D.   On: 05/25/2020 11:43   DG Chest Port 1 View  Result Date: 05/25/2020 CLINICAL DATA:  69 year old male with acute shortness of breath. EXAM: PORTABLE CHEST 1 VIEW COMPARISON:  None. FINDINGS: Cardiomegaly identified. Pulmonary vascular congestion is present with possible trace effusions. There is no evidence of pneumothorax, mass or consolidation. No acute bony abnormalities are present. IMPRESSION: Cardiomegaly with pulmonary vascular congestion and possible trace effusions. Electronically Signed   By: Margarette Canada M.D.   On: 05/25/2020 09:24   ECHOCARDIOGRAM COMPLETE  Result Date: 05/26/2020    ECHOCARDIOGRAM REPORT   Patient Name:   Steven Howard Date of Exam: 05/26/2020 Medical Rec #:  [id number]      Height:       72.0 in Accession #:    [id number]     Weight:       249.6 lb Date of Birth:  [birth year]      BSA:  2.341 m Patient Age:    5 years       BP:           105/54  mmHg Patient Gender: M              HR:           66 bpm. Exam Location:  [facility] Procedure: 2D Echo and Intracardiac Opacification Agent Indications:    Dyspnea 786.09 / R06.00  History:        Patient has no prior history of Echocardiogram examinations.                 CHF, COPD; Risk Factors:Current Smoker and Sleep Apnea. Past                 history of Orthostatic hypotension and cancer.  Sonographer:    Darlina Sicilian RDCS Referring Phys: [phone] [facility]  Sonographer Comments: Echo performed with patient supine and on artificial respirator. IMPRESSIONS  1. Left ventricular ejection fraction, by estimation, is 55 to 60%. The left ventricle has normal function. The left ventricle has no regional wall motion abnormalities. There is mild left ventricular hypertrophy. Left ventricular diastolic parameters are indeterminate.  2. Right ventricular systolic function is moderately reduced. The right ventricular size is moderately enlarged.  3. A small pericardial effusion is present. The pericardial effusion is circumferential. There is no evidence of cardiac tamponade.  4. The mitral valve was not well visualized. No evidence of mitral valve regurgitation. No evidence of mitral stenosis.  5. The aortic valve is tricuspid. Aortic valve regurgitation is not visualized. No aortic stenosis is present.  6. The inferior vena cava is dilated in size with <50% respiratory variability, suggesting right atrial pressure of 15 mmHg.  7. Technically difficult study FINDINGS  Left Ventricle: Left ventricular ejection fraction, by estimation, is 55 to 60%. The left ventricle has normal function. The left ventricle has no regional wall motion abnormalities. Definity contrast agent was given IV to delineate the left ventricular  endocardial borders. The left ventricular internal cavity size was normal in size. There is mild left ventricular hypertrophy. Left ventricular diastolic parameters are indeterminate. Right  Ventricle: The right ventricular size is moderately enlarged. Right vetricular wall thickness was not well visualized. Right ventricular systolic function is moderately reduced. Left Atrium: Left atrial size was not well visualized. Right Atrium: Right atrial size was not well visualized. Pericardium: A small pericardial effusion is present. The pericardial effusion is circumferential. There is no evidence of cardiac tamponade. Mitral Valve: The mitral valve was not well visualized. No evidence of mitral valve regurgitation. No evidence of mitral valve stenosis. Tricuspid Valve: The tricuspid valve is not well visualized. Tricuspid valve regurgitation is trivial. No evidence of tricuspid stenosis. Aortic Valve: The aortic valve is tricuspid. Aortic valve regurgitation is not visualized. No aortic stenosis is present. Pulmonic Valve: The pulmonic valve was not well visualized. Pulmonic valve regurgitation is not visualized. No evidence of pulmonic stenosis. Aorta: The aortic root is normal in size and structure. Venous: The inferior vena cava is dilated in size with less than 50% respiratory variability, suggesting right atrial pressure of 15 mmHg. IAS/Shunts: The interatrial septum was not well visualized.  LEFT VENTRICLE PLAX 2D LVIDd:         5.44 cm      Diastology LVIDs:         3.73 cm      LV e' medial:    3.71 cm/s LV PW:  1.19 cm      LV E/e' medial:  18.4 LV IVS:        1.25 cm      LV e' lateral:   5.51 cm/s LVOT diam:     2.30 cm      LV E/e' lateral: 12.4 LV SV:         81 LV SV Index:   35 LVOT Area:     4.15 cm  LV Volumes (MOD) LV vol d, MOD A2C: 134.0 ml LV vol d, MOD A4C: 150.0 ml LV vol s, MOD A2C: 91.4 ml LV vol s, MOD A4C: 123.0 ml LV SV MOD A2C:     42.6 ml LV SV MOD A4C:     150.0 ml LV SV MOD BP:      31.8 ml LEFT ATRIUM           Index       RIGHT ATRIUM           Index LA diam:      3.50 cm 1.50 cm/m  RA Area:     25.00 cm LA Vol (A2C): 35.6 ml 15.21 ml/m RA Volume:   81.00 ml   34.60 ml/m LA Vol (A4C): 81.2 ml 34.69 ml/m  AORTIC VALVE LVOT Vmax:   95.30 cm/s LVOT Vmean:  62.700 cm/s LVOT VTI:    0.196 m  AORTA Ao Root diam: 3.60 cm MITRAL VALVE               TRICUSPID VALVE MV Area (PHT): 2.78 cm    TR Peak grad:   24.4 mmHg MV Decel Time: 273 msec    TR Vmax:        247.00 cm/s MV E velocity: 68.10 cm/s MV A velocity: 75.00 cm/s  SHUNTS MV E/A ratio:  0.91        Systemic VTI:  0.20 m                            Systemic Diam: 2.30 cm Carlyle Dolly MD Electronically signed by Carlyle Dolly MD Signature Date/Time: 05/26/2020/4:03:26 PM    Final      Microbiology: Recent Results (from the past 240 hour(s))  SARS Coronavirus 2 by RT PCR (hospital order, performed in [place] hospital lab) Nasopharyngeal Nasopharyngeal Swab     Status: None   Collection Time: 06/03/20 11:00 AM   Specimen: Nasopharyngeal Swab  Result Value Ref Range Status   SARS Coronavirus 2 NEGATIVE NEGATIVE Final    Comment: (NOTE) SARS-CoV-2 target nucleic acids are NOT DETECTED.  The SARS-CoV-2 RNA is generally detectable in upper and lower respiratory specimens during the acute phase of infection. The lowest concentration of SARS-CoV-2 viral copies this assay can detect is 250 copies / mL. A negative result does not preclude SARS-CoV-2 infection and should not be used as the sole basis for treatment or other patient management decisions.  A negative result may occur with improper specimen collection / handling, submission of specimen other than nasopharyngeal swab, presence of viral mutation(s) within the areas targeted by this assay, and inadequate number of viral copies (<250 copies / mL). A negative result must be combined with clinical observations, patient history, and epidemiological information.  Fact Sheet for Patients:   StrictlyIdeas.no  Fact Sheet for Healthcare Providers: BankingDealers.co.za  This test is not yet approved  or  cleared by the [place] FDA and has been authorized for detection and/or diagnosis  of SARS-CoV-2 by FDA under an Emergency Use Authorization (EUA).  This EUA will remain in effect (meaning this test can be used) for the duration of the COVID-19 declaration under Section 564(b)(1) of the Act, 21 U.S.C. section 360bbb-3(b)(1), unless the authorization is terminated or revoked sooner.  Performed at [facility], [address], [place], [place] [address]   Respiratory Panel by RT PCR (Flu A&B, Covid) - Nasopharyngeal Swab     Status: None   Collection Time: 06/06/20 10:46 AM   Specimen: Nasopharyngeal Swab  Result Value Ref Range Status   SARS Coronavirus 2 by RT PCR NEGATIVE NEGATIVE Final    Comment: (NOTE) SARS-CoV-2 target nucleic acids are NOT DETECTED.  The SARS-CoV-2 RNA is generally detectable in upper respiratoy specimens during the acute phase of infection. The lowest concentration of SARS-CoV-2 viral copies this assay can detect is 131 copies/mL. A negative result does not preclude SARS-Cov-2 infection and should not be used as the sole basis for treatment or other patient management decisions. A negative result may occur with  improper specimen collection/handling, submission of specimen other than nasopharyngeal swab, presence of viral mutation(s) within the areas targeted by this assay, and inadequate number of viral copies (<131 copies/mL). A negative result must be combined with clinical observations, patient history, and epidemiological information. The expected result is Negative.  Fact Sheet for Patients:  PinkCheek.be  Fact Sheet for Healthcare Providers:  GravelBags.it  This test is no t yet approved or cleared by the [place] FDA and  has been authorized for detection and/or diagnosis of SARS-CoV-2 by FDA under an Emergency Use Authorization (EUA). This EUA will remain  in effect (meaning  this test can be used) for the duration of the COVID-19 declaration under Section 564(b)(1) of the Act, 21 U.S.C. section 360bbb-3(b)(1), unless the authorization is terminated or revoked sooner.     Influenza A by PCR NEGATIVE NEGATIVE Final   Influenza B by PCR NEGATIVE NEGATIVE Final    Comment: (NOTE) The Xpert Xpress SARS-CoV-2/FLU/RSV assay is intended as an aid in  the diagnosis of influenza from Nasopharyngeal swab specimens and  should not be used as a sole basis for treatment. Nasal washings and  aspirates are unacceptable for Xpert Xpress SARS-CoV-2/FLU/RSV  testing.  Fact Sheet for Patients: PinkCheek.be  Fact Sheet for Healthcare Providers: GravelBags.it  This test is not yet approved or cleared by the [place] FDA and  has been authorized for detection and/or diagnosis of SARS-CoV-2 by  FDA under an Emergency Use Authorization (EUA). This EUA will remain  in effect (meaning this test can be used) for the duration of the  Covid-19 declaration under Section 564(b)(1) of the Act, 21  U.S.C. section 360bbb-3(b)(1), unless the authorization is  terminated or revoked. Performed at [facility] [address] [place], [place] [address]      Labs: Basic Metabolic Panel: Recent Labs  Lab 06/04/20 0847 06/04/20 0847 06/05/20 0603 06/05/20 0603 06/06/20 0722 06/07/20 0631  NA 137  --  136  --  135 136  K 3.9   < > 4.0   < > 3.8 3.6  CL 88*  --  90*  --  91* 92*  CO2 41*  --  38*  --  36* 35*  GLUCOSE 73  --  85  --  87 91  BUN 21  --  19  --  17 18  CREATININE 0.54*  --  0.59*  --  0.61 0.55*  CALCIUM 9.1  --  9.2  --  9.2 9.4  MG 1.7  --  1.7  --  2.1 2.0   < > = values in this interval not displayed.   Liver Function Tests: No results for input(s): AST, ALT, ALKPHOS, BILITOT, PROT, ALBUMIN in the last 168 hours. No results for input(s): LIPASE, AMYLASE in the last 168 hours. No results for  input(s): AMMONIA in the last 168 hours. CBC: Recent Labs  Lab 06/06/20 0722  WBC 5.8  HGB 16.1  HCT 50.3  MCV 96.5  PLT 173   Cardiac Enzymes: No results for input(s): CKTOTAL, CKMB, CKMBINDEX, TROPONINI in the last 168 hours. BNP: Invalid input(s): POCBNP CBG: Recent Labs  Lab 06/04/20 2132  GLUCAP 153*    Time coordinating discharge:  36 minutes  Signed:  Orson Eva, DO [facility] Pager: [phone] 06/08/2020, 9:27 AM

## 2020-06-08 NOTE — Progress Notes (Signed)
Pt discharged in stable condition into the care of EMS for transport to [facility]. Discharge instruction packet sent with EMS.  Report called to [facility] on 06/07/20.

## 2020-06-12 DIAGNOSIS — E871 Hypo-osmolality and hyponatremia: Secondary | ICD-10-CM | POA: Diagnosis not present

## 2020-06-12 DIAGNOSIS — Z720 Tobacco use: Secondary | ICD-10-CM | POA: Diagnosis not present

## 2020-06-12 DIAGNOSIS — G4733 Obstructive sleep apnea (adult) (pediatric): Secondary | ICD-10-CM | POA: Diagnosis not present

## 2020-06-12 DIAGNOSIS — Q049 Congenital malformation of brain, unspecified: Secondary | ICD-10-CM | POA: Diagnosis not present

## 2020-06-12 DIAGNOSIS — I5030 Unspecified diastolic (congestive) heart failure: Secondary | ICD-10-CM | POA: Diagnosis not present

## 2020-06-12 DIAGNOSIS — I739 Peripheral vascular disease, unspecified: Secondary | ICD-10-CM | POA: Diagnosis not present

## 2020-06-12 DIAGNOSIS — J441 Chronic obstructive pulmonary disease with (acute) exacerbation: Secondary | ICD-10-CM | POA: Diagnosis not present

## 2020-06-12 DIAGNOSIS — J9691 Respiratory failure, unspecified with hypoxia: Secondary | ICD-10-CM | POA: Diagnosis not present

## 2020-06-13 DIAGNOSIS — I739 Peripheral vascular disease, unspecified: Secondary | ICD-10-CM | POA: Diagnosis not present

## 2020-06-13 DIAGNOSIS — E2740 Unspecified adrenocortical insufficiency: Secondary | ICD-10-CM | POA: Diagnosis not present

## 2020-06-13 DIAGNOSIS — J441 Chronic obstructive pulmonary disease with (acute) exacerbation: Secondary | ICD-10-CM | POA: Diagnosis not present

## 2020-06-13 DIAGNOSIS — G4733 Obstructive sleep apnea (adult) (pediatric): Secondary | ICD-10-CM | POA: Diagnosis not present

## 2020-06-13 DIAGNOSIS — Q049 Congenital malformation of brain, unspecified: Secondary | ICD-10-CM | POA: Diagnosis not present

## 2020-06-13 DIAGNOSIS — I5030 Unspecified diastolic (congestive) heart failure: Secondary | ICD-10-CM | POA: Diagnosis not present

## 2020-06-13 DIAGNOSIS — Z720 Tobacco use: Secondary | ICD-10-CM | POA: Diagnosis not present

## 2020-06-20 ENCOUNTER — Encounter: Payer: Self-pay | Admitting: Nurse Practitioner

## 2020-06-20 ENCOUNTER — Other Ambulatory Visit: Payer: Self-pay

## 2020-06-20 ENCOUNTER — Ambulatory Visit (INDEPENDENT_AMBULATORY_CARE_PROVIDER_SITE_OTHER): Payer: Medicare HMO | Admitting: Nurse Practitioner

## 2020-06-20 VITALS — BP 103/77 | HR 107 | Temp 98.7°F | Ht 72.0 in | Wt 210.2 lb

## 2020-06-20 DIAGNOSIS — R0902 Hypoxemia: Secondary | ICD-10-CM | POA: Insufficient documentation

## 2020-06-20 DIAGNOSIS — Z09 Encounter for follow-up examination after completed treatment for conditions other than malignant neoplasm: Secondary | ICD-10-CM | POA: Diagnosis not present

## 2020-06-20 HISTORY — DX: Hypoxemia: R09.02

## 2020-06-20 NOTE — Patient Instructions (Addendum)
Follow up with any s/s of hypoxia, or respiratory  Failure, 02 saturation less than 92%  Hypoxia Hypoxia is a condition that happens when there is a lack of oxygen in the body's tissues and organs. When there is not enough oxygen, organs cannot work as they should. This causes serious problems throughout the body and in the brain. What are the causes? This condition may be caused by:  Exposure to high altitude.  A collapsed lung (pneumothorax).  Lung infection (pneumonia).  Lung injury.  Long-term (chronic) lung disease, such as COPD (chronic obstructive pulmonary disease).  Blood collecting in the chest cavity (hemothorax).  Food, saliva, or vomit getting into the airway (aspiration).  Reduced blood flow (ischemia).  Severe blood loss.  Slow or shallow breathing (hypoventilation).  Blood disorders, such as anemia.  Carbon monoxide poisoning.  The heart suddenly stopping (cardiac arrest).  Anesthetic medicines.  Drowning.  Choking. What are the signs or symptoms? Symptoms of this condition include:  Headache.  Fatigue.  Drowsiness.  Forgetfulness.  Nausea.  Confusion.  Shortness of breath.  Dizziness.  Bluish color of the skin, lips, or nail beds (cyanosis).  Change in consciousness or awareness. If hypoxia is not treated, it can lead to convulsions, loss of consciousness (coma), or brain damage. How is this diagnosed? This condition may be diagnosed based on:  A physical exam.  Blood tests.  A test that measures how much oxygen is in your blood (pulse oximetry). This is done with a sensor that is placed on your finger, toe, or earlobe.  Chest X-ray.  Tests to check your lung function (pulmonary function tests).  A test to check the electrical activity of your heart (electrocardiogram, ECG). You may have other tests to determine the cause of your hypoxia. How is this treated?  Treatment for this condition depends on what is causing the  hypoxia. You will likely be treated with oxygen therapy. This may be done by giving you oxygen through a face mask or through tubes in your nose. Your health care provider may also recommend other therapies to treat the underlying cause of your hypoxia. Follow these instructions at home:  Take over-the-counter and prescription medicines only as told by your health care provider.  Do not use any products that contain nicotine or tobacco, such as cigarettes and e-cigarettes. If you need help quitting, ask your health care provider.  Avoid secondhand smoke.  Work with your health care provider to manage any chronic conditions you have that may be causing hypoxia, such as COPD.  Keep all follow-up visits as told by your health care provider. This is important. Contact a health care provider if:  You have a fever.  You have trouble breathing, even after treatment.  You become extremely short of breath when you exercise. Get help right away if:  Your shortness of breath gets worse, especially with normal or very little activity.  Your skin, lips, or nail beds have a bluish color.  You become confused or you cannot think properly.  You have chest pain. Summary  Hypoxia is a condition that happens when there is a lack of oxygen in the body's tissues and organs.  If hypoxia is not treated, it can lead to convulsions, loss of consciousness (coma), or brain damage.  Symptoms of hypoxia can include a headache, shortness of breath, confusion, nausea, and a bluish skin color.  Hypoxia has many possible causes, including exposure to high altitude, carbon monoxide poisoning, or other health issues, such as  blood disorders or cardiac arrest.  Hypoxia is usually treated with oxygen therapy. This information is not intended to replace advice given to you by your health care provider. Make sure you discuss any questions you have with your health care provider. Document Revised: 07/29/2017  Document Reviewed: 10/04/2016 Elsevier Patient Education  2020 [employer].

## 2020-06-20 NOTE — Progress Notes (Signed)
Established Patient Office Visit  Subjective:  Patient ID: Steven Howard, male    DOB: May 13, 1951  Age: 69 y.o. MRN: [id number]  CC:  Chief Complaint  Patient presents with  . Follow-up    From [facility]     HPI Steven Howard presents for hospital discharge follow-up.  Completed hospital discharge instructions with medication reconciliation.Patient verbalized understanding.  Labs completed.  Hypoxia Patient admitted to [facility] on 05/25/2020 for acute respiratory failure with hypoxia exacerbated by COPD.  Patient was intubated and sedated requiring ventilator management due to hypercapnic and hypoxic respiratory failure.  Patient was discharged 06/08/2020 to skilled nursing facility for physical therapy after treatment  stabilization.  Today patient is reporting gradual strength with mild shortness of breath.  O2 sat 93% provided education, discharge instructions completed.  Past Medical History:  Diagnosis Date  . Cancer [facility]) 2011   Prostate  . COPD (chronic obstructive pulmonary disease) ([place])   . Glaucoma   . Sleep apnea     Past Surgical History:  Procedure Laterality Date  . 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 PROSTATE SURGERY  2011   seeds implanted/radiation    Family History  Problem Relation Age of Onset  . Stroke Mother   . Alcohol abuse Brother   . Heart disease Maternal Grandmother   . COPD Brother     Social History   Socioeconomic History  . Marital status: Widowed    Spouse name: Not on file  . Number of children: 2  . Years of education: 56  . Highest education level: High school graduate  Occupational History  . Occupation: Retired  Tobacco Use  . Smoking status: Current Every Day Smoker    Packs/day: 0.25    Years: 55.00    Pack  years: 13.75    Types: Cigars, Cigarettes  . Smokeless tobacco: Never Used  Vaping Use  . Vaping Use: Never used  Substance and Sexual Activity  . Alcohol use: Yes    Comment: occasional  . Drug use: Never  . Sexual activity: Not Currently  Other Topics Concern  . Not on file  Social History Narrative  . Not on file   Social Determinants of Health   Financial Resource Strain:   . Difficulty of Paying Living Expenses: Not on file  Food Insecurity:   . Worried About Charity fundraiser in the Last Year: Not on file  . Ran Out of Food in the Last Year: Not on file  Transportation Needs:   . Lack of Transportation (Medical): Not on file  . Lack of Transportation (Non-Medical): Not on file  Physical Activity:   . Days of Exercise per Week: Not on file  . Minutes of Exercise per Session: Not on file  Stress:   . Feeling of Stress : Not on file  Social Connections:   . Frequency of Communication with Friends and Family: Not on file  . Frequency of Social Gatherings with Friends and Family: Not on file  . Attends Religious Services: Not on file  . Active Member of Clubs or Organizations: Not on file  . Attends Archivist Meetings: Not on file  . Marital Status: Not on file  Intimate Partner Violence:   . Fear of Current or Ex-Partner: Not on file  .  Emotionally Abused: Not on file  . Physically Abused: Not on file  . Sexually Abused: Not on file    Outpatient Medications Prior to Visit  Medication Sig Dispense Refill  . albuterol (VENTOLIN HFA) 108 (90 Base) MCG/ACT inhaler Inhale 2 puffs into the lungs every 4 (four) hours as needed for wheezing or shortness of breath (cough, shortness of breath or wheezing.). 1 each 1  . BREO ELLIPTA 100-25 MCG/INH AEPB INHALE 1 PUFF INTO LUNGS DAILY (Patient taking differently: Inhale 1 puff into the lungs daily. ) 60 each 0  . furosemide (LASIX) 40 MG tablet Take 1 tablet (40 mg total) by mouth daily. 30 tablet 0  . Multiple  Vitamin (MULTIVITAMIN WITH MINERALS) TABS tablet Take 1 tablet by mouth daily.    . potassium chloride SA (KLOR-CON) 20 MEQ tablet Take 1 tablet (20 mEq total) by mouth daily.    Marland Kitchen docusate sodium (COLACE) 100 MG capsule Take 1 capsule (100 mg total) by mouth daily. (Patient not taking: Reported on 06/20/2020) 10 capsule 0  . ipratropium-albuterol (DUONEB) 0.5-2.5 (3) MG/3ML SOLN Take 3 mLs by nebulization every 4 (four) hours as needed. (Patient not taking: Reported on 06/20/2020) 360 mL   . metoprolol tartrate (LOPRESSOR) 25 MG tablet Take 0.5 tablets (12.5 mg total) by mouth 2 (two) times daily. (Patient not taking: Reported on 06/20/2020) 60 tablet 0  . nicotine (NICODERM CQ - DOSED IN MG/24 HOURS) 21 mg/24hr patch Place 1 patch (21 mg total) onto the skin daily as needed (nicotine cravings). (Patient not taking: Reported on 06/20/2020) 28 patch 0  . polyethylene glycol (MIRALAX / GLYCOLAX) 17 g packet Take 17 g by mouth daily as needed for mild constipation. (Patient not taking: Reported on 06/20/2020) 14 each 0  . predniSONE (DELTASONE) 20 MG tablet Take 2 tabs daily with breakfast x 3 days, then 1 tab daily x 7 days     No facility-administered medications prior to visit.    No Known Allergies  ROS Review of Systems  Respiratory: Negative for cough, shortness of breath and wheezing.   All other systems reviewed and are negative.     Objective:    Physical Exam Vitals reviewed.  Constitutional:      Appearance: Normal appearance.  HENT:     Mouth/Throat:     Pharynx: Oropharynx is clear.  Eyes:     Conjunctiva/sclera: Conjunctivae normal.  Cardiovascular:     Rate and Rhythm: Normal rate and regular rhythm.     Pulses: Normal pulses.  Pulmonary:     Effort: Pulmonary effort is normal.     Breath sounds: Normal breath sounds.  Abdominal:     General: Bowel sounds are normal.  Musculoskeletal:        General: Tenderness present.  Skin:    General: Skin is warm.   Neurological:     Mental Status: He is alert and oriented to person, place, and time.     BP 103/77   Pulse (!) 107   Temp 98.7 F (37.1 C) (Temporal)   Ht 6' (1.829 m)   Wt 210 lb 3.2 oz (95.3 kg)   SpO2 93%   BMI 28.51 kg/m  Wt Readings from Last 3 Encounters:  06/20/20 210 lb 3.2 oz (95.3 kg)  06/07/20 217 lb 2.5 oz (98.5 kg)  01/08/20 233 lb (105.7 kg)     There are no preventive care reminders to display for this patient.  There are no preventive care reminders to display for  this patient.  Lab Results  Component Value Date   TSH 1.002 06/04/2020   Lab Results  Component Value Date   WBC 5.8 06/06/2020   HGB 16.1 06/06/2020   HCT 50.3 06/06/2020   MCV 96.5 06/06/2020   PLT 173 06/06/2020   Lab Results  Component Value Date   NA 136 06/07/2020   K 3.6 06/07/2020   CO2 35 (H) 06/07/2020   GLUCOSE 91 06/07/2020   BUN 18 06/07/2020   CREATININE 0.55 (L) 06/07/2020   BILITOT 1.3 (H) 05/25/2020   ALKPHOS 62 05/25/2020   AST 22 05/25/2020   ALT 15 05/25/2020   PROT 6.5 05/25/2020   ALBUMIN 3.4 (L) 05/25/2020   CALCIUM 9.4 06/07/2020   ANIONGAP 9 06/07/2020   Lab Results  Component Value Date   CHOL 188 01/08/2020   Lab Results  Component Value Date   HDL 86 01/08/2020   Lab Results  Component Value Date   LDLCALC 90 01/08/2020   Lab Results  Component Value Date   TRIG 481 (H) 05/29/2020   Lab Results  Component Value Date   CHOLHDL 2.2 01/08/2020      Assessment & Plan:   Problem List Items Addressed This Visit      Respiratory   Hypoxia - Primary    Hypoxia well controlled.  Patient O2 saturation today 93%.  Patient is not reporting any new concerns      Relevant Orders   Basic Metabolic Panel   CBC with Differential     Other   Hospital discharge follow-up    Hospital discharge instructions completed, patient verbalized understanding. Labs completed-CMP, BMP.  Results pending          Follow-up: Return if symptoms  worsen or fail to improve.    Ivy Lynn, NP

## 2020-06-21 LAB — BASIC METABOLIC PANEL
BUN/Creatinine Ratio: 13 (ref 10–24)
BUN: 10 mg/dL (ref 8–27)
CO2: 30 mmol/L — ABNORMAL HIGH (ref 20–29)
Calcium: 9.8 mg/dL (ref 8.6–10.2)
Chloride: 95 mmol/L — ABNORMAL LOW (ref 96–106)
Creatinine, Ser: 0.79 mg/dL (ref 0.76–1.27)
GFR calc Af Amer: 104 mL/min/{1.73_m2} (ref >59)
GFR calc non Af Amer: 90 mL/min/{1.73_m2} (ref >59)
Glucose: 65 mg/dL (ref 65–99)
Potassium: 5.3 mmol/L — ABNORMAL HIGH (ref 3.5–5.2)
Sodium: 140 mmol/L (ref 134–144)

## 2020-06-21 LAB — CBC WITH DIFFERENTIAL/PLATELET
Basophils Absolute: 0.1 10*3/uL (ref 0.0–0.2)
Basos: 1 %
EOS (ABSOLUTE): 0.2 10*3/uL (ref 0.0–0.4)
Eos: 2 %
Hematocrit: 51.3 % — ABNORMAL HIGH (ref 37.5–51.0)
Hemoglobin: 17 g/dL (ref 13.0–17.7)
Immature Grans (Abs): 0 10*3/uL (ref 0.0–0.1)
Immature Granulocytes: 0 %
Lymphocytes Absolute: 2.8 10*3/uL (ref 0.7–3.1)
Lymphs: 31 %
MCH: 30.9 pg (ref 26.6–33.0)
MCHC: 33.1 g/dL (ref 31.5–35.7)
MCV: 93 fL (ref 79–97)
Monocytes Absolute: 0.9 10*3/uL (ref 0.1–0.9)
Monocytes: 10 %
Neutrophils Absolute: 5.1 10*3/uL (ref 1.4–7.0)
Neutrophils: 56 %
Platelets: 402 10*3/uL (ref 150–450)
RBC: 5.51 x10E6/uL (ref 4.14–5.80)
RDW: 15.1 % (ref 11.6–15.4)
WBC: 9 10*3/uL (ref 3.4–10.8)

## 2020-06-21 NOTE — Assessment & Plan Note (Signed)
Hypoxia well controlled.  Patient O2 saturation today 93%.  Patient is not reporting any new concerns

## 2020-06-21 NOTE — Assessment & Plan Note (Signed)
Hospital discharge instructions completed, patient verbalized understanding. Labs completed-CMP, BMP.  Results pending

## 2020-06-22 DIAGNOSIS — I5031 Acute diastolic (congestive) heart failure: Secondary | ICD-10-CM | POA: Diagnosis not present

## 2020-06-22 DIAGNOSIS — I739 Peripheral vascular disease, unspecified: Secondary | ICD-10-CM | POA: Diagnosis not present

## 2020-06-22 DIAGNOSIS — J9601 Acute respiratory failure with hypoxia: Secondary | ICD-10-CM | POA: Diagnosis not present

## 2020-06-22 DIAGNOSIS — I110 Hypertensive heart disease with heart failure: Secondary | ICD-10-CM | POA: Diagnosis not present

## 2020-06-22 DIAGNOSIS — Q049 Congenital malformation of brain, unspecified: Secondary | ICD-10-CM | POA: Diagnosis not present

## 2020-06-22 DIAGNOSIS — J439 Emphysema, unspecified: Secondary | ICD-10-CM | POA: Diagnosis not present

## 2020-06-22 DIAGNOSIS — J9602 Acute respiratory failure with hypercapnia: Secondary | ICD-10-CM | POA: Diagnosis not present

## 2020-06-22 DIAGNOSIS — G4733 Obstructive sleep apnea (adult) (pediatric): Secondary | ICD-10-CM | POA: Diagnosis not present

## 2020-06-22 DIAGNOSIS — I951 Orthostatic hypotension: Secondary | ICD-10-CM | POA: Diagnosis not present

## 2020-06-24 ENCOUNTER — Telehealth: Payer: Self-pay

## 2020-06-30 ENCOUNTER — Other Ambulatory Visit: Payer: Self-pay

## 2020-06-30 ENCOUNTER — Ambulatory Visit (INDEPENDENT_AMBULATORY_CARE_PROVIDER_SITE_OTHER): Payer: Medicare HMO

## 2020-06-30 DIAGNOSIS — G4733 Obstructive sleep apnea (adult) (pediatric): Secondary | ICD-10-CM | POA: Diagnosis not present

## 2020-06-30 DIAGNOSIS — J439 Emphysema, unspecified: Secondary | ICD-10-CM | POA: Diagnosis not present

## 2020-06-30 DIAGNOSIS — F329 Major depressive disorder, single episode, unspecified: Secondary | ICD-10-CM

## 2020-06-30 DIAGNOSIS — J9602 Acute respiratory failure with hypercapnia: Secondary | ICD-10-CM | POA: Diagnosis not present

## 2020-06-30 DIAGNOSIS — E785 Hyperlipidemia, unspecified: Secondary | ICD-10-CM

## 2020-06-30 DIAGNOSIS — I5031 Acute diastolic (congestive) heart failure: Secondary | ICD-10-CM

## 2020-06-30 DIAGNOSIS — E871 Hypo-osmolality and hyponatremia: Secondary | ICD-10-CM

## 2020-06-30 DIAGNOSIS — I110 Hypertensive heart disease with heart failure: Secondary | ICD-10-CM

## 2020-06-30 DIAGNOSIS — Z8546 Personal history of malignant neoplasm of prostate: Secondary | ICD-10-CM

## 2020-06-30 DIAGNOSIS — H409 Unspecified glaucoma: Secondary | ICD-10-CM

## 2020-06-30 DIAGNOSIS — I951 Orthostatic hypotension: Secondary | ICD-10-CM

## 2020-06-30 DIAGNOSIS — I739 Peripheral vascular disease, unspecified: Secondary | ICD-10-CM

## 2020-06-30 DIAGNOSIS — Q049 Congenital malformation of brain, unspecified: Secondary | ICD-10-CM

## 2020-06-30 DIAGNOSIS — J9601 Acute respiratory failure with hypoxia: Secondary | ICD-10-CM

## 2020-06-30 DIAGNOSIS — Z9181 History of falling: Secondary | ICD-10-CM

## 2020-06-30 DIAGNOSIS — F17210 Nicotine dependence, cigarettes, uncomplicated: Secondary | ICD-10-CM

## 2020-07-03 ENCOUNTER — Other Ambulatory Visit: Payer: Self-pay

## 2020-07-03 ENCOUNTER — Ambulatory Visit (INDEPENDENT_AMBULATORY_CARE_PROVIDER_SITE_OTHER): Payer: Medicare HMO | Admitting: Urology

## 2020-07-03 ENCOUNTER — Encounter: Payer: Self-pay | Admitting: Urology

## 2020-07-03 VITALS — BP 102/66 | HR 111 | Temp 98.0°F | Ht 72.0 in | Wt 210.2 lb

## 2020-07-03 DIAGNOSIS — N35011 Post-traumatic bulbous urethral stricture: Secondary | ICD-10-CM

## 2020-07-03 DIAGNOSIS — C61 Malignant neoplasm of prostate: Secondary | ICD-10-CM

## 2020-07-03 LAB — URINALYSIS, ROUTINE W REFLEX MICROSCOPIC
Bilirubin, UA: NEGATIVE
Glucose, UA: NEGATIVE
Ketones, UA: NEGATIVE
Nitrite, UA: NEGATIVE
Specific Gravity, UA: 1.02 (ref 1.005–1.030)
Urobilinogen, Ur: 0.2 mg/dL (ref 0.2–1.0)
pH, UA: 5.5 (ref 5.0–7.5)

## 2020-07-03 LAB — MICROSCOPIC EXAMINATION
Renal Epithel, UA: NONE SEEN /hpf
WBC, UA: >30 /hpf — AB (ref 0–5)

## 2020-07-03 NOTE — Progress Notes (Signed)
07/03/2020 3:06 PM   [name] September 11, 1950 [id number]  Referring provider: Claretta Fraise, MD [place],  [place] [id number]  Followup prostate cancer and urethral stricture  HPI: Mr Cicero is a 69yo here for followup for urethral stricture and prostate cancer. Last PSA was undetectable in 12/2019. He uses a 16 coude twice a week to dilate his urethral stricture. 2 months ago he had difficulty passing the foley but now he has no issues. NO UTIs. No dysuria.   His records from [facility] are as follows: I have prostate cancer (treatment).  HPI: Callahan Wild is a 69 year-old male established patient who is here for prostate cancer which has been treated.    Initial diagnosis of prostate cancer on 5.25.2012 At that time, PSA 9.5, prostatic volume 36.5 g, PSA density 0.26. He did have a prostatic nodule.   Initial pathology--2/12 cores positive, both revealing GS 3+4, one of the cores had 99% involvement.  He was initiated on neoadjuvant androgen deprivation therapy on 7.2.20.12  He underwent combination radiotherapy, completed in October, 2012.  He has had an excellent PSA response, most recently <0.02 one drawn by Dr. Livia Snellen   2.11.2020:     CC: I have a urethral stricture (surgery).  HPI: He has had dilation for the treatment of his urethral stricture . He does do self dilations .   He underwent cystoscopy, balloon dilation of bulbous urethral stricture on 10.29.2019.   11.5.2019: He has had an excellent urinary stream since his balloon dilation. He feels like he is emptying his bladder better.   11.2020: He uses an 56 [place] coude tip catheter to self catheterize once a week. Overall he has an excellent stream. He has no significant issues with using the catheter.   PMH: Past Medical History:  Diagnosis Date   Cancer ([place]) 2011   Prostate   COPD (chronic obstructive pulmonary disease) ([place])    Glaucoma    Sleep apnea     Surgical History: Past Surgical  History:  Procedure Laterality Date   CYSTOSCOPY WITH URETHRAL DILATATION N/A 06/27/2018   Procedure: CYSTOSCOPY WITH BALLOON DILATATION OF URETHRAL STRICTURE;  Surgeon: Franchot Gallo, MD;  Location: [facility];  Service: Urology;  Laterality: N/A;   PROSTATE SURGERY  2011   seeds implanted/radiation    Home Medications:  Allergies as of 07/03/2020   No Known Allergies     Medication List       Accurate as of July 03, 2020  3:06 PM. If you have any questions, ask your nurse or doctor.        albuterol 108 (90 Base) MCG/ACT inhaler Commonly known as: VENTOLIN HFA Inhale 2 puffs into the lungs every 4 (four) hours as needed for wheezing or shortness of breath (cough, shortness of breath or wheezing.).   Breo Ellipta 100-25 MCG/INH Aepb Generic drug: fluticasone furoate-vilanterol INHALE 1 PUFF INTO LUNGS DAILY   docusate sodium 100 MG capsule Commonly known as: COLACE Take 1 capsule (100 mg total) by mouth daily.   furosemide 40 MG tablet Commonly known as: LASIX Take 1 tablet (40 mg total) by mouth daily.   ipratropium-albuterol 0.5-2.5 (3) MG/3ML Soln Commonly known as: DUONEB Take 3 mLs by nebulization every 4 (four) hours as needed.   metoprolol tartrate 25 MG tablet Commonly known as: LOPRESSOR Take 0.5 tablets (12.5 mg total) by mouth 2 (two) times daily.   multivitamin with minerals Tabs tablet Take 1 tablet by mouth daily.   nicotine  21 mg/24hr patch Commonly known as: NICODERM CQ - dosed in mg/24 hours Place 1 patch (21 mg total) onto the skin daily as needed (nicotine cravings).   polyethylene glycol 17 g packet Commonly known as: MIRALAX / GLYCOLAX Take 17 g by mouth daily as needed for mild constipation.   potassium chloride SA 20 MEQ tablet Commonly known as: KLOR-CON Take 1 tablet (20 mEq total) by mouth daily.       Allergies: No Known Allergies  Family History: Family History  Problem Relation Age of Onset   Stroke Mother     Alcohol abuse Brother    Heart disease Maternal Grandmother    COPD Brother     Social History:  reports that he has been smoking cigars and cigarettes. He has a 13.75 pack-year smoking history. He has never used smokeless tobacco. He reports current alcohol use. He reports that he does not use drugs.  ROS: All other review of systems were reviewed and are negative except what is noted above in HPI  Physical Exam: BP 102/66    Pulse (!) 111    Temp 98 F (36.7 C)    Ht 6' (1.829 m)    Wt 210 lb 3.2 oz (95.3 kg)    BMI 28.51 kg/m   Constitutional:  Alert and oriented, No acute distress. HEENT: [place] AT, moist mucus membranes.  Trachea midline, no masses. Cardiovascular: No clubbing, cyanosis, or edema. Respiratory: Normal respiratory effort, no increased work of breathing. GI: Abdomen is soft, nontender, nondistended, no abdominal masses GU: No CVA tenderness.  Lymph: No cervical or inguinal lymphadenopathy. Skin: No rashes, bruises or suspicious lesions. Neurologic: Grossly intact, no focal deficits, moving all 4 extremities. Psychiatric: Normal mood and affect.  Laboratory Data: Lab Results  Component Value Date   WBC 9.0 06/20/2020   HGB 17.0 06/20/2020   HCT 51.3 (H) 06/20/2020   MCV 93 06/20/2020   PLT 402 06/20/2020    Lab Results  Component Value Date   CREATININE 0.79 06/20/2020    No results found for: PSA  No results found for: TESTOSTERONE  No results found for: HGBA1C  Urinalysis    Component Value Date/Time   COLORURINE STRAW (A) 05/25/2020 0831   APPEARANCEUR Hazy (A) 07/03/2020 1415   LABSPEC 1.004 (L) 05/25/2020 0831   PHURINE 6.0 05/25/2020 0831   GLUCOSEU Negative 07/03/2020 1415   HGBUR MODERATE (A) 05/25/2020 0831   BILIRUBINUR Negative 07/03/2020 1415   KETONESUR 5 (A) 05/25/2020 0831   PROTEINUR 2+ (A) 07/03/2020 1415   PROTEINUR NEGATIVE 05/25/2020 0831   NITRITE Negative 07/03/2020 1415   NITRITE NEGATIVE 05/25/2020 0831    LEUKOCYTESUR 1+ (A) 07/03/2020 1415   LEUKOCYTESUR SMALL (A) 05/25/2020 0831    Lab Results  Component Value Date   LABMICR See below: 07/03/2020   WBCUA >30 (A) 07/03/2020   LABEPIT 0-10 07/03/2020   BACTERIA Many (A) 07/03/2020    Pertinent Imaging:  No results found for this or any previous visit.  No results found for this or any previous visit.  No results found for this or any previous visit.  No results found for this or any previous visit.  No results found for this or any previous visit.  No results found for this or any previous visit.  No results found for this or any previous visit.  No results found for this or any previous visit.   Assessment & Plan:    1. Prostate cancer ([place]) -PSA today. If stable  RTC 6 months with PSA  - Urinalysis, Routine w reflex microscopic  2. Post-traumatic bulbous urethral stricture -continue biweekly [facility]   No follow-ups on file.  Nicolette Bang, MD  [facility] [place]

## 2020-07-03 NOTE — Progress Notes (Signed)
Urological Symptom Review  Patient is experiencing the following symptoms: Frequent urination Hard to postpone urination Get up at night to urinate Leakage of urine   Review of Systems  Gastrointestinal (upper)  : Negative for upper GI symptoms  Gastrointestinal (lower) : Diarrhea  Constitutional : Negative for symptoms  Skin: Negative for skin symptoms  Eyes: Negative for eye symptoms  Ear/Nose/Throat : Negative for Ear/Nose/Throat symptoms  Hematologic/Lymphatic: Bruise easily  Cardiovascular : Negative for cardiovascular symptoms  Respiratory : Negative for respiratory symptoms  Endocrine: Negative for endocrine symptoms  Musculoskeletal: Joint pain  Neurological: Dizziness  Psychologic: Negative for psychiatric symptoms

## 2020-07-03 NOTE — Patient Instructions (Signed)
Urethral Stricture  Urethral stricture is narrowing of the tube (urethra) that carries urine from the bladder out of the body. The urethra can become narrow due to scar tissue from an injury or infection. This can make it difficult to pass urine. In women, the urethra opens above the vaginal opening. In men, the urethra opens at the tip of the penis, and the urethra is much longer than it is in women. Because of the length of the male urethra, urethral stricture is much more common in men. This condition is treated with surgery. What are the causes? In both men and women, common causes of urethral stricture include:  Urinary tract infection (UTI).  Sexually transmitted infection (STI).  Use of a tube placed into the urethra to drain urine from the bladder (urinary catheter).  Urinary tract surgery. In men, common causes of urethral stricture include:  A severe injury to the pelvis.  Prostate surgery.  Injury to the penis. In many cases, the cause of urethral stricture is not known. What increases the risk? You are more likely to develop this condition if you:  Are male. Men who have had prostate surgery are at risk of developing this condition.  Use a urinary catheter.  Have had urinary tract surgery. What are the signs or symptoms? The main symptom of this condition is difficulty passing urine. This may cause decreased urine flow, dribbling, or spraying of urine. Other symptom of this condition may include:  Frequent UTIs.  Blood in the urine.  Pain when urinating.  Swelling of the penis in men.  Inability to pass urine (urinary obstruction). How is this diagnosed? This condition may be diagnosed based on:  Your medical history and a physical exam.  Urine tests to check for infection or bleeding.  X-rays.  Ultrasound.  Retrograde urethrogram. This is a type of test in which dye is injected into the urethra and then an X-ray is taken.  Urethroscopy. This is when  a thin tube with a light and camera on the end (urethroscope) is used to look at the urethra. How is this treated? This condition is treated with surgery. The type of surgery that you have depends on the severity of your condition. You may have:  Urethral dilation. In this procedure, the narrow part of the urethra is stretched open (dilated) with dilating instruments or a small balloon.  Urethrotomy. In this procedure, a urethroscope is placed into the urethra, and the narrow part of the urethra is cut open with a surgical blade inserted through the urethroscope.  Open surgery. In this procedure, an incision is made in the urethra, the narrow part is removed, and the urethra is reconstructed. Follow these instructions at home:   Take over-the-counter and prescription medicines only as told by your health care provider.  If you were prescribed an antibiotic medicine, take it as told by your health care provider. Do not stop taking the antibiotic even if you start to feel better.  Drink enough fluid to keep your urine pale yellow.  Keep all follow-up visits as told by your health care provider. This is important. Contact a health care provider if:  You have signs of a urinary tract infection, such as: ? Frequent urination or passing small amounts of urine frequently. ? Needing to urinate urgently. ? Pain or burning with urination. ? Urine that smells bad or unusual. ? Cloudy urine. ? Pain in the lower abdomen or back. ? Trouble urinating. ? Blood in the urine. ?   Vomiting or being less hungry than normal. ? Diarrhea or abdominal pain. ? Vaginal discharge, if you are male.  Your symptoms are getting worse instead of better. Get help right away if:  You cannot pass urine.  You have a fever.  You have swelling, bruising, or discoloration of your genital area. This includes the penis, scrotum, and inner thighs for men, and the outer genital organs (vulva) and inner thighs for  women.  You develop swelling in your legs.  You have difficulty breathing. Summary  Urethral stricture is narrowing of the tube (urethra) that carries urine from the bladder out of the body. The urethra can become narrow due to scar tissue from an injury or infection.  This condition can make it difficult to pass urine.  This condition is treated with surgery. The type of surgery that you have depends on the severity of your condition.  Contact a health care provider if your symptoms get worse or you have signs of a urinary tract infection. This information is not intended to replace advice given to you by your health care provider. Make sure you discuss any questions you have with your health care provider. Document Revised: 03/29/2018 Document Reviewed: 03/29/2018 Elsevier Patient Education  [place].

## 2020-07-23 ENCOUNTER — Inpatient Hospital Stay: Payer: Medicare HMO | Admitting: Pulmonary Disease

## 2020-11-25 ENCOUNTER — Other Ambulatory Visit: Payer: Self-pay

## 2020-11-25 ENCOUNTER — Ambulatory Visit: Payer: Medicare HMO | Admitting: Family Medicine

## 2020-11-25 ENCOUNTER — Ambulatory Visit (INDEPENDENT_AMBULATORY_CARE_PROVIDER_SITE_OTHER): Payer: Medicare HMO | Admitting: Nurse Practitioner

## 2020-11-25 VITALS — BP 95/62 | HR 104 | Temp 97.4°F

## 2020-11-25 DIAGNOSIS — L0100 Impetigo, unspecified: Secondary | ICD-10-CM | POA: Diagnosis not present

## 2020-11-25 DIAGNOSIS — R21 Rash and other nonspecific skin eruption: Secondary | ICD-10-CM | POA: Diagnosis not present

## 2020-11-25 MED ORDER — SULFAMETHOXAZOLE-TRIMETHOPRIM 800-160 MG PO TABS: 1.0000 | ORAL_TABLET | Freq: Two times a day (BID) | ORAL | 0 refills | Status: DC

## 2020-11-25 NOTE — Progress Notes (Signed)
Acute Office Visit  Subjective:    Patient ID: Steven Howard, male    DOB: 06-26-1951, 70 y.o.   MRN: [id number]  Chief Complaint  Patient presents with  . Rash    Rash This is a recurrent problem. The current episode started more than 1 month ago. The problem has been gradually improving since onset. The affected locations include the back. The rash is characterized by dryness, redness and scaling. It is unknown if there was an exposure to a precipitant. Pertinent negatives include no anorexia, congestion, cough, diarrhea, fatigue, fever or shortness of breath. Past treatments include nothing. There is no history of allergies or asthma.      Past Medical History:  Diagnosis Date  . Cancer [facility]) 2011   Prostate  . COPD (chronic obstructive pulmonary disease) ([place])   . Glaucoma   . Sleep apnea     Past Surgical History:  Procedure Laterality Date  . 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 PROSTATE SURGERY  2011   seeds implanted/radiation    Family History  Problem Relation Age of Onset  . Stroke Mother   . Alcohol abuse Brother   . Heart disease Maternal Grandmother   . COPD Brother     Social History   Socioeconomic History  . Marital status: Widowed    Spouse name: Not on file  . Number of children: 2  . Years of education: 48  . Highest education level: High school graduate  Occupational History  . Occupation: Retired  Tobacco Use  . Smoking status: Current Every Day Smoker    Packs/day: 0.25    Years: 55.00    Pack years: 13.75    Types: Cigars, Cigarettes  . Smokeless tobacco: Never Used  Vaping Use  . Vaping Use: Never used  Substance and Sexual Activity  . Alcohol use: Yes    Comment: occasional  . Drug use: Never  . Sexual activity: Not Currently  Other Topics Concern   . Not on file  Social History Narrative  . Not on file   Social Determinants of Health   Financial Resource Strain: Not on file  Food Insecurity: Not on file  Transportation Needs: Not on file  Physical Activity: Not on file  Stress: Not on file  Social Connections: Not on file  Intimate Partner Violence: Not on file    Outpatient Medications Prior to Visit  Medication Sig Dispense Refill  . albuterol (VENTOLIN HFA) 108 (90 Base) MCG/ACT inhaler Inhale 2 puffs into the lungs every 4 (four) hours as needed for wheezing or shortness of breath (cough, shortness of breath or wheezing.). 1 each 1  . BREO ELLIPTA 100-25 MCG/INH AEPB INHALE 1 PUFF INTO LUNGS DAILY 60 each 0  . docusate sodium (COLACE) 100 MG capsule Take 1 capsule (100 mg total) by mouth daily. 10 capsule 0  . ipratropium-albuterol (DUONEB) 0.5-2.5 (3) MG/3ML SOLN Take 3 mLs by nebulization every 4 (four) hours as needed. 360 mL   . furosemide (LASIX) 40 MG tablet Take 1 tablet (40 mg total) by mouth daily. (Patient not taking: Reported on 11/25/2020) 30 tablet 0  . metoprolol tartrate (LOPRESSOR) 25 MG tablet Take 0.5 tablets (12.5 mg total) by mouth 2 (two) times daily. (Patient not taking: Reported on 11/25/2020) 60 tablet 0  . Multiple Vitamin (MULTIVITAMIN WITH MINERALS) TABS  tablet Take 1 tablet by mouth daily.    . nicotine (NICODERM CQ - DOSED IN MG/24 HOURS) 21 mg/24hr patch Place 1 patch (21 mg total) onto the skin daily as needed (nicotine cravings). (Patient not taking: Reported on 06/20/2020) 28 patch 0  . polyethylene glycol (MIRALAX / GLYCOLAX) 17 g packet Take 17 g by mouth daily as needed for mild constipation. (Patient not taking: Reported on 06/20/2020) 14 each 0  . potassium chloride SA (KLOR-CON) 20 MEQ tablet Take 1 tablet (20 mEq total) by mouth daily. (Patient not taking: Reported on 11/25/2020)     No facility-administered medications prior to visit.    No Known Allergies  Review of Systems   Constitutional: Negative for fatigue and fever.  HENT: Negative for congestion.   Respiratory: Negative for cough and shortness of breath.   Gastrointestinal: Negative for anorexia and diarrhea.  Genitourinary: Negative.   Musculoskeletal: Negative.   Skin: Positive for rash.  All other systems reviewed and are negative.      Objective:    Physical Exam Vitals reviewed.  Constitutional:      Appearance: Normal appearance.  HENT:     Head: Normocephalic.     Nose: Nose normal.  Eyes:     Conjunctiva/sclera: Conjunctivae normal.  Cardiovascular:     Pulses: Normal pulses.     Heart sounds: Normal heart sounds.  Pulmonary:     Effort: Pulmonary effort is normal.     Breath sounds: Normal breath sounds.  Abdominal:     General: Bowel sounds are normal.  Musculoskeletal:        General: Normal range of motion.  Skin:    General: Skin is dry.     Findings: Erythema and rash present.  Neurological:     Mental Status: He is alert and oriented to person, place, and time.  Psychiatric:        Behavior: Behavior normal.     BP 95/62   Pulse (!) 104   Temp (!) 97.4 F (36.3 C) (Temporal)   SpO2 (!) 89%  Wt Readings from Last 3 Encounters:  07/03/20 210 lb 3.2 oz (95.3 kg)  06/20/20 210 lb 3.2 oz (95.3 kg)  06/07/20 217 lb 2.5 oz (98.5 kg)    Health Maintenance Due  Topic Date Due  . TETANUS/TDAP  Never done  . COLONOSCOPY (Pts 45-30yrs Insurance coverage will need to be confirmed)  Never done  . PNA vac Low Risk Adult (1 of 2 - PCV13) Never done    There are no preventive care reminders to display for this patient.   Lab Results  Component Value Date   TSH 1.002 06/04/2020   Lab Results  Component Value Date   WBC 9.0 06/20/2020   HGB 17.0 06/20/2020   HCT 51.3 (H) 06/20/2020   MCV 93 06/20/2020   PLT 402 06/20/2020   Lab Results  Component Value Date   NA 140 06/20/2020   K 5.3 (H) 06/20/2020   CO2 30 (H) 06/20/2020   GLUCOSE 65 06/20/2020   BUN  10 06/20/2020   CREATININE 0.79 06/20/2020   BILITOT 1.3 (H) 05/25/2020   ALKPHOS 62 05/25/2020   AST 22 05/25/2020   ALT 15 05/25/2020   PROT 6.5 05/25/2020   ALBUMIN 3.4 (L) 05/25/2020   CALCIUM 9.8 06/20/2020   ANIONGAP 9 06/07/2020   Lab Results  Component Value Date   CHOL 188 01/08/2020   Lab Results  Component Value Date   HDL 86 01/08/2020  Lab Results  Component Value Date   LDLCALC 90 01/08/2020   Lab Results  Component Value Date   TRIG 481 (H) 05/29/2020   Lab Results  Component Value Date   CHOLHDL 2.2 01/08/2020   No results found for: HGBA1C     Assessment & Plan:   Problem List Items Addressed This Visit      Musculoskeletal and Integument   Rash   Impetigo - Primary    Symptoms not well controlled.  Started patient on Bactrim DS.  Education provided to patient with printed handouts given.  Follow-up with worsening or unresolved symptoms.      Relevant Medications   sulfamethoxazole-trimethoprim (BACTRIM DS) 800-160 MG tablet       Meds ordered this encounter  Medications  . sulfamethoxazole-trimethoprim (BACTRIM DS) 800-160 MG tablet    Sig: Take 1 tablet by mouth 2 (two) times daily.    Dispense:  14 tablet    Refill:  0    Order Specific Question:   Supervising Provider    Answer:   Janora Norlander [[id number]]     Ivy Lynn, NP

## 2020-11-25 NOTE — Patient Instructions (Signed)
Rash, Adult  A rash is a change in the color of your skin. A rash can also change the way your skin feels. There are many different conditions and factors that can cause a rash. Follow these instructions at home: The goal of treatment is to stop the itching and keep the rash from spreading. Watch for any changes in your symptoms. Let your doctor know about them. Follow these instructions to help with your condition: Medicine Take or apply over-the-counter and prescription medicines only as told by your doctor. These may include medicines:  To treat red or swollen skin (corticosteroid creams).  To treat itching.  To treat an allergy (oral antihistamines).  To treat very bad symptoms (oral corticosteroids).   Skin care  Put cool cloths (compresses) on the affected areas.  Do not scratch or rub your skin.  Avoid covering the rash. Make sure that the rash is exposed to air as much as possible. Managing itching and discomfort  Avoid hot showers or baths. These can make itching worse. A cold shower may help.  Try taking a bath with: ? Epsom salts. You can get these at your local pharmacy or grocery store. Follow the instructions on the package. ? Baking soda. Pour a small amount into the bath as told by your doctor. ? Colloidal oatmeal. You can get this at your local pharmacy or grocery store. Follow the instructions on the package.  Try putting baking soda paste onto your skin. Stir water into baking soda until it gets like a paste.  Try putting on a lotion that relieves itchiness (calamine lotion).  Keep cool and out of the sun. Sweating and being hot can make itching worse. General instructions  Rest as needed.  Drink enough fluid to keep your pee (urine) pale yellow.  Wear loose-fitting clothing.  Avoid scented soaps, detergents, and perfumes. Use gentle soaps, detergents, perfumes, and other cosmetic products.  Avoid anything that causes your rash. Keep a journal to help  track what causes your rash. Write down: ? What you eat. ? What cosmetic products you use. ? What you drink. ? What you wear. This includes jewelry.  Keep all follow-up visits as told by your doctor. This is important.   Contact a doctor if:  You sweat at night.  You lose weight.  You pee (urinate) more than normal.  You pee less than normal, or you notice that your pee is a darker color than normal.  You feel weak.  You throw up (vomit).  Your skin or the whites of your eyes look yellow (jaundice).  Your skin: ? Tingles. ? Is numb.  Your rash: ? Does not go away after a few days. ? Gets worse.  You are: ? More thirsty than normal. ? More tired than normal.  You have: ? New symptoms. ? Pain in your belly (abdomen). ? A fever. ? Watery poop (diarrhea). Get help right away if:  You have a fever and your symptoms suddenly get worse.  You start to feel mixed up (confused).  You have a very bad headache or a stiff neck.  You have very bad joint pains or stiffness.  You have jerky movements that you cannot control (seizure).  Your rash covers all or most of your body. The rash may or may not be painful.  You have blisters that: ? Are on top of the rash. ? Grow larger. ? Grow together. ? Are painful. ? Are inside your nose or mouth.  You have   a rash that: ? Looks like purple pinprick-sized spots all over your body. ? Has a "bull's eye" or looks like a target. ? Is red and painful, causes your skin to peel, and is not from being in the sun too long. Summary  A rash is a change in the color of your skin. A rash can also change the way your skin feels.  The goal of treatment is to stop the itching and keep the rash from spreading.  Take or apply over-the-counter and prescription medicines only as told by your doctor.  Contact a doctor if you have new symptoms or symptoms that get worse.  Keep all follow-up visits as told by your doctor. This is  important. This information is not intended to replace advice given to you by your health care provider. Make sure you discuss any questions you have with your health care provider. Document Revised: 12/08/2018 Document Reviewed: 03/20/2018 Elsevier Patient Education  2021 Elsevier Inc.  

## 2020-11-26 ENCOUNTER — Emergency Department (HOSPITAL_COMMUNITY): Payer: Medicare HMO

## 2020-11-26 ENCOUNTER — Inpatient Hospital Stay (HOSPITAL_COMMUNITY)
Admission: EM | Admit: 2020-11-26 | Discharge: 2020-12-03 | DRG: 896 | Disposition: A | Discharge status: Skilled Nursing Facility | Payer: Medicare HMO | Attending: Internal Medicine | Admitting: Internal Medicine

## 2020-11-26 ENCOUNTER — Encounter: Payer: Self-pay | Admitting: Nurse Practitioner

## 2020-11-26 ENCOUNTER — Encounter (HOSPITAL_COMMUNITY): Payer: Self-pay

## 2020-11-26 ENCOUNTER — Inpatient Hospital Stay (HOSPITAL_COMMUNITY): Payer: Medicare HMO

## 2020-11-26 DIAGNOSIS — F10129 Alcohol abuse with intoxication, unspecified: Secondary | ICD-10-CM | POA: Diagnosis not present

## 2020-11-26 DIAGNOSIS — I952 Hypotension due to drugs: Secondary | ICD-10-CM | POA: Diagnosis not present

## 2020-11-26 DIAGNOSIS — J9690 Respiratory failure, unspecified, unspecified whether with hypoxia or hypercapnia: Secondary | ICD-10-CM | POA: Diagnosis not present

## 2020-11-26 DIAGNOSIS — G9341 Metabolic encephalopathy: Secondary | ICD-10-CM | POA: Diagnosis not present

## 2020-11-26 DIAGNOSIS — J90 Pleural effusion, not elsewhere classified: Secondary | ICD-10-CM | POA: Diagnosis not present

## 2020-11-26 DIAGNOSIS — R262 Difficulty in walking, not elsewhere classified: Secondary | ICD-10-CM | POA: Diagnosis not present

## 2020-11-26 DIAGNOSIS — Z9119 Patient's noncompliance with other medical treatment and regimen: Secondary | ICD-10-CM

## 2020-11-26 DIAGNOSIS — J9811 Atelectasis: Secondary | ICD-10-CM | POA: Diagnosis not present

## 2020-11-26 DIAGNOSIS — R69 Illness, unspecified: Secondary | ICD-10-CM | POA: Diagnosis not present

## 2020-11-26 DIAGNOSIS — Z7141 Alcohol abuse counseling and surveillance of alcoholic: Secondary | ICD-10-CM

## 2020-11-26 DIAGNOSIS — N136 Pyonephrosis: Secondary | ICD-10-CM | POA: Diagnosis not present

## 2020-11-26 DIAGNOSIS — J9601 Acute respiratory failure with hypoxia: Secondary | ICD-10-CM | POA: Diagnosis not present

## 2020-11-26 DIAGNOSIS — R4182 Altered mental status, unspecified: Secondary | ICD-10-CM

## 2020-11-26 DIAGNOSIS — N3000 Acute cystitis without hematuria: Secondary | ICD-10-CM | POA: Diagnosis not present

## 2020-11-26 DIAGNOSIS — E871 Hypo-osmolality and hyponatremia: Secondary | ICD-10-CM | POA: Diagnosis present

## 2020-11-26 DIAGNOSIS — G4733 Obstructive sleep apnea (adult) (pediatric): Secondary | ICD-10-CM | POA: Diagnosis present

## 2020-11-26 DIAGNOSIS — Z781 Physical restraint status: Secondary | ICD-10-CM | POA: Diagnosis not present

## 2020-11-26 DIAGNOSIS — J9602 Acute respiratory failure with hypercapnia: Secondary | ICD-10-CM | POA: Diagnosis present

## 2020-11-26 DIAGNOSIS — Z8546 Personal history of malignant neoplasm of prostate: Secondary | ICD-10-CM

## 2020-11-26 DIAGNOSIS — I5031 Acute diastolic (congestive) heart failure: Secondary | ICD-10-CM | POA: Diagnosis not present

## 2020-11-26 DIAGNOSIS — G4700 Insomnia, unspecified: Secondary | ICD-10-CM | POA: Diagnosis present

## 2020-11-26 DIAGNOSIS — I878 Other specified disorders of veins: Secondary | ICD-10-CM | POA: Diagnosis present

## 2020-11-26 DIAGNOSIS — Z811 Family history of alcohol abuse and dependence: Secondary | ICD-10-CM

## 2020-11-26 DIAGNOSIS — Z7951 Long term (current) use of inhaled steroids: Secondary | ICD-10-CM

## 2020-11-26 DIAGNOSIS — E8729 Other acidosis: Secondary | ICD-10-CM

## 2020-11-26 DIAGNOSIS — F10929 Alcohol use, unspecified with intoxication, unspecified: Secondary | ICD-10-CM | POA: Diagnosis not present

## 2020-11-26 DIAGNOSIS — F1010 Alcohol abuse, uncomplicated: Secondary | ICD-10-CM | POA: Diagnosis not present

## 2020-11-26 DIAGNOSIS — F17210 Nicotine dependence, cigarettes, uncomplicated: Secondary | ICD-10-CM | POA: Diagnosis present

## 2020-11-26 DIAGNOSIS — J189 Pneumonia, unspecified organism: Secondary | ICD-10-CM | POA: Diagnosis present

## 2020-11-26 DIAGNOSIS — M6281 Muscle weakness (generalized): Secondary | ICD-10-CM | POA: Diagnosis not present

## 2020-11-26 DIAGNOSIS — E872 Acidosis, unspecified: Secondary | ICD-10-CM | POA: Diagnosis present

## 2020-11-26 DIAGNOSIS — Z825 Family history of asthma and other chronic lower respiratory diseases: Secondary | ICD-10-CM

## 2020-11-26 DIAGNOSIS — J441 Chronic obstructive pulmonary disease with (acute) exacerbation: Secondary | ICD-10-CM | POA: Diagnosis not present

## 2020-11-26 DIAGNOSIS — E860 Dehydration: Secondary | ICD-10-CM | POA: Diagnosis present

## 2020-11-26 DIAGNOSIS — Z452 Encounter for adjustment and management of vascular access device: Secondary | ICD-10-CM | POA: Diagnosis not present

## 2020-11-26 DIAGNOSIS — E878 Other disorders of electrolyte and fluid balance, not elsewhere classified: Secondary | ICD-10-CM | POA: Diagnosis present

## 2020-11-26 DIAGNOSIS — R5381 Other malaise: Secondary | ICD-10-CM | POA: Diagnosis not present

## 2020-11-26 DIAGNOSIS — I959 Hypotension, unspecified: Secondary | ICD-10-CM | POA: Diagnosis not present

## 2020-11-26 DIAGNOSIS — Z79899 Other long term (current) drug therapy: Secondary | ICD-10-CM

## 2020-11-26 DIAGNOSIS — G9389 Other specified disorders of brain: Secondary | ICD-10-CM | POA: Diagnosis not present

## 2020-11-26 DIAGNOSIS — R0902 Hypoxemia: Secondary | ICD-10-CM | POA: Diagnosis not present

## 2020-11-26 DIAGNOSIS — Y908 Blood alcohol level of 240 mg/100 ml or more: Secondary | ICD-10-CM | POA: Diagnosis present

## 2020-11-26 DIAGNOSIS — Z01818 Encounter for other preprocedural examination: Secondary | ICD-10-CM

## 2020-11-26 DIAGNOSIS — N390 Urinary tract infection, site not specified: Secondary | ICD-10-CM | POA: Diagnosis present

## 2020-11-26 DIAGNOSIS — Z4682 Encounter for fitting and adjustment of non-vascular catheter: Secondary | ICD-10-CM | POA: Diagnosis not present

## 2020-11-26 DIAGNOSIS — Z20822 Contact with and (suspected) exposure to covid-19: Secondary | ICD-10-CM | POA: Diagnosis not present

## 2020-11-26 DIAGNOSIS — H409 Unspecified glaucoma: Secondary | ICD-10-CM | POA: Diagnosis present

## 2020-11-26 DIAGNOSIS — J440 Chronic obstructive pulmonary disease with acute lower respiratory infection: Secondary | ICD-10-CM | POA: Diagnosis not present

## 2020-11-26 DIAGNOSIS — K760 Fatty (change of) liver, not elsewhere classified: Secondary | ICD-10-CM | POA: Diagnosis not present

## 2020-11-26 DIAGNOSIS — R29818 Other symptoms and signs involving the nervous system: Secondary | ICD-10-CM | POA: Diagnosis not present

## 2020-11-26 DIAGNOSIS — I517 Cardiomegaly: Secondary | ICD-10-CM | POA: Diagnosis not present

## 2020-11-26 DIAGNOSIS — Z7401 Bed confinement status: Secondary | ICD-10-CM | POA: Diagnosis not present

## 2020-11-26 DIAGNOSIS — J9691 Respiratory failure, unspecified with hypoxia: Secondary | ICD-10-CM | POA: Diagnosis present

## 2020-11-26 DIAGNOSIS — R000 Tachycardia, unspecified: Secondary | ICD-10-CM | POA: Diagnosis not present

## 2020-11-26 LAB — URINALYSIS, ROUTINE W REFLEX MICROSCOPIC
Bilirubin Urine: NEGATIVE
Glucose, UA: NEGATIVE mg/dL
Ketones, ur: 5 mg/dL — AB
Nitrite: NEGATIVE
Protein, ur: 100 mg/dL — AB
RBC / HPF: >50 RBC/hpf — ABNORMAL HIGH (ref 0–5)
Specific Gravity, Urine: 1.018 (ref 1.005–1.030)
WBC, UA: >50 WBC/hpf — ABNORMAL HIGH (ref 0–5)
pH: 6 (ref 5.0–8.0)

## 2020-11-26 LAB — BLOOD GAS, ARTERIAL
Acid-Base Excess: 2.5 mmol/L — ABNORMAL HIGH (ref 0.0–2.0)
Acid-Base Excess: 3.6 mmol/L — ABNORMAL HIGH (ref 0.0–2.0)
Bicarbonate: 24.7 mmol/L (ref 20.0–28.0)
Bicarbonate: 27.5 mmol/L (ref 20.0–28.0)
FIO2: 60
FIO2: 60
O2 Saturation: 98.1 %
O2 Saturation: 97.4 %
Patient temperature: 35.9
Patient temperature: 35.9
pCO2 arterial: 57.2 mmHg — ABNORMAL HIGH (ref 32.0–48.0)
pCO2 arterial: 39.1 mmHg (ref 32.0–48.0)
pH, Arterial: 7.31 — ABNORMAL LOW (ref 7.350–7.450)
pH, Arterial: 7.456 — ABNORMAL HIGH (ref 7.350–7.450)
pO2, Arterial: 168 mmHg — ABNORMAL HIGH (ref 83.0–108.0)
pO2, Arterial: 113 mmHg — ABNORMAL HIGH (ref 83.0–108.0)

## 2020-11-26 LAB — CBC WITH DIFFERENTIAL/PLATELET
Abs Immature Granulocytes: 0.03 10*3/uL (ref 0.00–0.07)
Basophils Absolute: 0.1 10*3/uL (ref 0.0–0.1)
Basophils Relative: 1 %
Eosinophils Absolute: 0.1 10*3/uL (ref 0.0–0.5)
Eosinophils Relative: 1 %
HCT: 51.6 % (ref 39.0–52.0)
Hemoglobin: 17.5 g/dL — ABNORMAL HIGH (ref 13.0–17.0)
Immature Granulocytes: 0 %
Lymphocytes Relative: 51 %
Lymphs Abs: 4.7 10*3/uL — ABNORMAL HIGH (ref 0.7–4.0)
MCH: 34.2 pg — ABNORMAL HIGH (ref 26.0–34.0)
MCHC: 33.9 g/dL (ref 30.0–36.0)
MCV: 100.8 fL — ABNORMAL HIGH (ref 80.0–100.0)
Monocytes Absolute: 0.8 10*3/uL (ref 0.1–1.0)
Monocytes Relative: 9 %
Neutro Abs: 3.6 10*3/uL (ref 1.7–7.7)
Neutrophils Relative %: 38 %
Platelets: 189 10*3/uL (ref 150–400)
RBC: 5.12 MIL/uL (ref 4.22–5.81)
RDW: 16 % — ABNORMAL HIGH (ref 11.5–15.5)
WBC: 9.3 10*3/uL (ref 4.0–10.5)
nRBC: 0.2 % (ref 0.0–0.2)

## 2020-11-26 LAB — COMPREHENSIVE METABOLIC PANEL
ALT: 13 U/L (ref 0–44)
AST: 26 U/L (ref 15–41)
Albumin: 3.3 g/dL — ABNORMAL LOW (ref 3.5–5.0)
Alkaline Phosphatase: 88 U/L (ref 38–126)
Anion gap: 12 (ref 5–15)
BUN: 13 mg/dL (ref 8–23)
CO2: 30 mmol/L (ref 22–32)
Calcium: 8.3 mg/dL — ABNORMAL LOW (ref 8.9–10.3)
Chloride: 90 mmol/L — ABNORMAL LOW (ref 98–111)
Creatinine, Ser: 0.64 mg/dL (ref 0.61–1.24)
GFR, Estimated: >60 mL/min (ref >60)
Glucose, Bld: 108 mg/dL — ABNORMAL HIGH (ref 70–99)
Potassium: 4.1 mmol/L (ref 3.5–5.1)
Sodium: 132 mmol/L — ABNORMAL LOW (ref 135–145)
Total Bilirubin: 0.3 mg/dL (ref 0.3–1.2)
Total Protein: 6.7 g/dL (ref 6.5–8.1)

## 2020-11-26 LAB — RESP PANEL BY RT-PCR (FLU A&B, COVID) ARPGX2
Influenza A by PCR: NEGATIVE
Influenza B by PCR: NEGATIVE
SARS Coronavirus 2 by RT PCR: NEGATIVE

## 2020-11-26 LAB — TROPONIN I (HIGH SENSITIVITY)
Troponin I (High Sensitivity): 12 ng/L (ref <18)
Troponin I (High Sensitivity): 12 ng/L (ref <18)

## 2020-11-26 LAB — RAPID URINE DRUG SCREEN, HOSP PERFORMED
Amphetamines: NOT DETECTED
Barbiturates: NOT DETECTED
Benzodiazepines: NOT DETECTED
Cocaine: NOT DETECTED
Opiates: NOT DETECTED
Tetrahydrocannabinol: NOT DETECTED

## 2020-11-26 LAB — CBG MONITORING, ED: Glucose-Capillary: 135 mg/dL — ABNORMAL HIGH (ref 70–99)

## 2020-11-26 LAB — LACTIC ACID, PLASMA
Lactic Acid, Venous: 2.6 mmol/L (ref 0.5–1.9)
Lactic Acid, Venous: 10.6 mmol/L (ref 0.5–1.9)

## 2020-11-26 LAB — APTT: aPTT: 30 seconds (ref 24–36)

## 2020-11-26 LAB — PROTIME-INR
INR: 1 (ref 0.8–1.2)
Prothrombin Time: 12.9 seconds (ref 11.4–15.2)

## 2020-11-26 LAB — ETHANOL: Alcohol, Ethyl (B): 365 mg/dL (ref <10)

## 2020-11-26 MED ORDER — SUCCINYLCHOLINE CHLORIDE 20 MG/ML IJ SOLN
120.0000 mg | Freq: Once | INTRAMUSCULAR | Status: AC
  Administered 2020-11-26: 120 mg via INTRAVENOUS

## 2020-11-26 MED ORDER — FENTANYL CITRATE (PF) 100 MCG/2ML IJ SOLN
50.0000 ug | Freq: Once | INTRAMUSCULAR | Status: AC
  Administered 2020-11-26: 50 ug via INTRAVENOUS
  Filled 2020-11-26: qty 2

## 2020-11-26 MED ORDER — SODIUM CHLORIDE 0.9 % IV SOLN: 250.0000 mL | INTRAVENOUS | Status: DC

## 2020-11-26 MED ORDER — NALOXONE HCL 2 MG/2ML IJ SOSY
1.0000 mg | PREFILLED_SYRINGE | Freq: Once | INTRAMUSCULAR | Status: AC
  Administered 2020-11-26: 1 mg via INTRAVENOUS

## 2020-11-26 MED ORDER — LACTATED RINGERS IV BOLUS
1000.0000 mL | Freq: Once | INTRAVENOUS | Status: AC
  Administered 2020-11-26: 1000 mL via INTRAVENOUS

## 2020-11-26 MED ORDER — FENTANYL BOLUS VIA INFUSION
25.0000 ug | INTRAVENOUS | Status: DC | PRN
Start: 2020-11-26 — End: 2020-11-28
  Administered 2020-11-26: 25 ug via INTRAVENOUS
  Filled 2020-11-26: qty 25

## 2020-11-26 MED ORDER — NALOXONE HCL 2 MG/2ML IJ SOSY
PREFILLED_SYRINGE | INTRAMUSCULAR | Status: AC
  Filled 2020-11-26: qty 2

## 2020-11-26 MED ORDER — SODIUM CHLORIDE 0.9 % IV SOLN
100.0000 mL/h | INTRAVENOUS | Status: DC
  Administered 2020-11-26 – 2020-12-03 (×12): 100 mL/h via INTRAVENOUS

## 2020-11-26 MED ORDER — DEXMEDETOMIDINE HCL IN NACL 200 MCG/50ML IV SOLN
0.4000 ug/kg/h | INTRAVENOUS | Status: DC
  Administered 2020-11-26: 0.4 ug/kg/h via INTRAVENOUS
  Administered 2020-11-27: 0.5 ug/kg/h via INTRAVENOUS
  Filled 2020-11-26: qty 50

## 2020-11-26 MED ORDER — PROPOFOL 1000 MG/100ML IV EMUL
0.0000 ug/kg/min | INTRAVENOUS | Status: DC
  Administered 2020-11-26: 5 ug/kg/min via INTRAVENOUS
  Filled 2020-11-26: qty 100

## 2020-11-26 MED ORDER — MIDAZOLAM HCL 2 MG/2ML IJ SOLN
1.0000 mg | Freq: Once | INTRAMUSCULAR | Status: AC
  Administered 2020-11-26: 1 mg via INTRAVENOUS

## 2020-11-26 MED ORDER — MIDAZOLAM HCL 2 MG/2ML IJ SOLN
INTRAMUSCULAR | Status: AC
  Filled 2020-11-26: qty 2

## 2020-11-26 MED ORDER — SODIUM CHLORIDE 0.9 % IV SOLN: Freq: Once | INTRAVENOUS | Status: AC

## 2020-11-26 MED ORDER — FENTANYL 2500MCG IN NS 250ML (10MCG/ML) PREMIX INFUSION
25.0000 ug/h | INTRAVENOUS | Status: DC
  Administered 2020-11-26: 50 ug/h via INTRAVENOUS
  Filled 2020-11-26: qty 250

## 2020-11-26 MED ORDER — PHENYLEPHRINE HCL-NACL 10-0.9 MG/250ML-% IV SOLN
25.0000 ug/min | INTRAVENOUS | Status: DC
  Filled 2020-11-26 (×2): qty 250

## 2020-11-26 MED ORDER — SODIUM CHLORIDE 0.9 % IV BOLUS
500.0000 mL | Freq: Once | INTRAVENOUS | Status: AC
  Administered 2020-11-26: 500 mL via INTRAVENOUS

## 2020-11-26 MED ORDER — ETOMIDATE 2 MG/ML IV SOLN
20.0000 mg | Freq: Once | INTRAVENOUS | Status: AC
  Administered 2020-11-26: 20 mg via INTRAVENOUS

## 2020-11-26 MED ORDER — SODIUM CHLORIDE 0.9 % IV SOLN
2.0000 g | Freq: Once | INTRAVENOUS | Status: AC
  Administered 2020-11-26: 2 g via INTRAVENOUS
  Filled 2020-11-26: qty 20

## 2020-11-26 NOTE — Assessment & Plan Note (Signed)
Symptoms not well controlled.  Started patient on Bactrim DS.  Education provided to patient with printed handouts given.  Follow-up with worsening or unresolved symptoms.

## 2020-11-26 NOTE — ED Notes (Signed)
Date and time results received: 11/26/20 11:13 PM (use smartphrase ".now" to insert current time)  Test: lactic acid Critical Value: 10.6  Name of Provider Notified: Dr. Clearence Ped  Orders Received? Or Actions Taken?: no new orders at this time. Dr. Earnest Conroy at bedside

## 2020-11-26 NOTE — ED Triage Notes (Signed)
Pt brought to ED via [facility]. Called out for SOB, pt told EMS he wasn't SOB he was just drunk. Pt became unresponsive enroute to ED.

## 2020-11-26 NOTE — ED Notes (Signed)
Son Jakell Trusty called and updated at [phone]. Aware of room #, contact information and visitation information.

## 2020-11-26 NOTE — ED Notes (Signed)
Unable to obtain temp at this time. 

## 2020-11-26 NOTE — ED Provider Notes (Signed)
[facility] Provider Note   CSN: [id number] Arrival date & time: 11/26/20  1737   Level 5 caveat: Altered mental status  History Shortness of breath: Altered mental status  Steven Howard is a 70 y.o. male.  HPI   Patient presented to the ED for possible shortness of breath with an abrupt change in his mental status.  According to the EMS they were initially called out by family members because the patient was having difficulty with his breathing.  Family members has checked his pulse ox and it was low and he seemed to be having difficulty with his breathing.  However when they spoke to the patient he denied having any issues with his breathing and told him he was just drunk.  Patient was initially fine however during transport he became unresponsive and would not answer any further questions.  His oxygen saturation dropped and they had to put him on a nonrebreather.  He had sonorous respirations.  In the ED the patient is able to answer any my questions.  I am unable to get any additional history  Past Medical History:  Diagnosis Date  . Cancer [facility]) 2011   Prostate  . COPD (chronic obstructive pulmonary disease) ([place])   . Glaucoma   . Sleep apnea     Patient Active Problem List   Diagnosis Date Noted  . Rash 11/25/2020  . Impetigo 11/25/2020  . Hypoxia 06/20/2020  . Hospital discharge follow-up 06/20/2020  . Acute diastolic CHF (congestive heart failure) ([place]) 06/04/2020  . Respiratory failure with hypoxia ([place]) 05/25/2020  . Nicotine abuse 05/25/2020  . COPD (chronic obstructive pulmonary disease)/Emphysema 05/25/2020  . Acute respiratory failure with hypoxia and hypercapnia ([place]) 05/25/2020  . Acute exacerbation of CHF (congestive heart failure) ([place]) 05/25/2020  . COPD with acute exacerbation ([place]) 05/25/2020  . Orthostatic hypotension 12/18/2018  . Hyponatremia 07/11/2018  . Sleeps in sitting position due to orthopnea 05/19/2018  . OSA and COPD  overlap syndrome ([place]) 05/19/2018  . 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 Excessive daytime sleepiness 05/19/2018  . Other sleep apnea 04/20/2018  . Prostate cancer ([place]) 04/18/2018  . Frequent UTI 04/18/2018  . Pulmonary emphysema ([place]) 04/18/2018  . Essential hypertension 04/18/2018  . Alcohol abuse 04/18/2018  . Mixed hyperlipidemia 04/18/2018  . Hematuria 04/18/2018  . Recurrent major depressive disorder, in partial remission ([place]) 04/18/2018  . Cigarette nicotine dependence without complication 59/74/1638  . PVD (peripheral vascular disease) ([place]) 04/18/2018    Past Surgical History:  Procedure Laterality Date  . 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 PROSTATE SURGERY  2011   seeds implanted/radiation       Family History  Problem Relation Age of Onset  . Stroke Mother   . Alcohol abuse Brother   . Heart disease Maternal Grandmother   . COPD Brother     Social History   Tobacco Use  . Smoking status: Current Every Day Smoker    Packs/day: 0.25    Years: 55.00    Pack years: 13.75    Types: Cigars, Cigarettes  . Smokeless tobacco: Never Used  Vaping Use  . Vaping Use: Never used  Substance Use Topics  . Alcohol use: Yes    Comment: occasional  . Drug use: Never    Home Medications Prior to Admission medications  Medication Sig Start Date End Date Taking? Authorizing Provider  albuterol (VENTOLIN HFA) 108 (90 Base) MCG/ACT inhaler Inhale 2 puffs into the lungs every 4 (four) hours as needed for wheezing or shortness of breath (cough, shortness of breath or wheezing.). 06/03/20   Johnson, Clanford L, MD  BREO ELLIPTA 100-25 MCG/INH AEPB INHALE 1 PUFF INTO LUNGS DAILY 02/28/20   Claretta Fraise, MD  docusate sodium (COLACE) 100 MG capsule Take 1 capsule (100 mg  total) by mouth daily. 06/03/20   Johnson, Clanford L, MD  furosemide (LASIX) 40 MG tablet Take 1 tablet (40 mg total) by mouth daily. Patient not taking: Reported on 11/25/2020 06/08/20   TatShanon Brow, MD  ipratropium-albuterol (DUONEB) 0.5-2.5 (3) MG/3ML SOLN Take 3 mLs by nebulization every 4 (four) hours as needed. 06/03/20   Johnson, Clanford L, MD  metoprolol tartrate (LOPRESSOR) 25 MG tablet Take 0.5 tablets (12.5 mg total) by mouth 2 (two) times daily. Patient not taking: Reported on 11/25/2020 06/07/20   Orson Eva, MD  Multiple Vitamin (MULTIVITAMIN WITH MINERALS) TABS tablet Take 1 tablet by mouth daily. 06/03/20   Johnson, Clanford L, MD  nicotine (NICODERM CQ - DOSED IN MG/24 HOURS) 21 mg/24hr patch Place 1 patch (21 mg total) onto the skin daily as needed (nicotine cravings). Patient not taking: Reported on 06/20/2020 06/03/20   Irwin Brakeman L, MD  polyethylene glycol (MIRALAX / GLYCOLAX) 17 g packet Take 17 g by mouth daily as needed for mild constipation. Patient not taking: Reported on 06/20/2020 06/03/20   Irwin Brakeman L, MD  potassium chloride SA (KLOR-CON) 20 MEQ tablet Take 1 tablet (20 mEq total) by mouth daily. Patient not taking: Reported on 11/25/2020 06/08/20   Orson Eva, MD  sulfamethoxazole-trimethoprim (BACTRIM DS) 800-160 MG tablet Take 1 tablet by mouth 2 (two) times daily. 11/25/20   Ivy Lynn, NP    Allergies    Patient has no known allergies.  Review of Systems   Review of Systems  Unable to perform ROS: Acuity of condition    Physical Exam Updated Vital Signs BP 134/81   Pulse (!) 119   Resp 19   Ht 1.829 m (6')   Wt 90.7 kg   SpO2 (!) 84%   BMI 27.12 kg/m   Physical Exam Vitals and nursing note reviewed.  Constitutional:      General: He is in acute distress.     Appearance: He is well-developed. He is ill-appearing.     Comments: Unresponsive  HENT:     Head: Normocephalic and atraumatic.     Right Ear: External ear normal.      Left Ear: External ear normal.  Eyes:     General: No scleral icterus.       Right eye: No discharge.        Left eye: No discharge.     Conjunctiva/sclera: Conjunctivae normal.  Neck:     Trachea: No tracheal deviation.  Cardiovascular:     Rate and Rhythm: Normal rate and regular rhythm.  Pulmonary:     Effort: Pulmonary effort is normal. No respiratory distress.     Breath sounds: Normal breath sounds. No stridor. No wheezing or rales.  Abdominal:     General: Bowel sounds are normal. There is no distension.     Palpations: Abdomen is soft.     Tenderness: There is no abdominal tenderness. There is no guarding or rebound.  Genitourinary:    Penis: Normal.   Musculoskeletal:  General: No tenderness.     Cervical back: Neck supple.     Right lower leg: No edema.     Left lower leg: No edema.  Skin:    General: Skin is warm and dry.     Findings: No rash.  Neurological:     Mental Status: He is unresponsive.     GCS: GCS eye subscore is 1. GCS verbal subscore is 1. GCS motor subscore is 1.     Motor: No abnormal muscle tone or seizure activity.     Comments: Patient does not respond to verbal or painful stimuli, no seizure activity noted.  No obvious facial asymmetry but the patient to move his extremities and respond to any commands     ED Results / Procedures / Treatments   Labs (all labs ordered are listed, but only abnormal results are displayed) Labs Reviewed  CBG MONITORING, ED - Abnormal; Notable for the following components:      Result Value   Glucose-Capillary 135 (*)    All other components within normal limits  RESP PANEL BY RT-PCR (FLU A&B, COVID) ARPGX2  CBC WITH DIFFERENTIAL/PLATELET  COMPREHENSIVE METABOLIC PANEL  URINALYSIS, ROUTINE W REFLEX MICROSCOPIC  RAPID URINE DRUG SCREEN, HOSP PERFORMED  ETHANOL  PROTIME-INR  APTT  TROPONIN I (HIGH SENSITIVITY)    EKG None  Radiology No results found.  Procedures Procedure Name:  Intubation Date/Time: 11/26/2020 6:07 PM Performed by: Dorie Rank, MD Pre-anesthesia Checklist: Patient identified, Patient being monitored, Emergency Drugs available, Timeout performed and Suction available Oxygen Delivery Method: Non-rebreather mask Preoxygenation: Pre-oxygenation with 100% oxygen Induction Type: Rapid sequence Ventilation: Mask ventilation without difficulty Laryngoscope Size: Glidescope Tube size: 8.0 mm Number of attempts: 1 Airway Equipment and Method: Video-laryngoscopy Placement Confirmation: ETT inserted through vocal cords under direct vision,  CO2 detector and Breath sounds checked- equal and bilateral Secured at: 26 cm Dental Injury: Teeth and Oropharynx as per pre-operative assessment      .Critical Care Performed by: Dorie Rank, MD Authorized by: Dorie Rank, MD   Critical care provider statement:    Critical care time (minutes):  45   Critical care was time spent personally by me on the following activities:  Discussions with consultants, evaluation of patient's response to treatment, examination of patient, ordering and performing treatments and interventions, ordering and review of laboratory studies, ordering and review of radiographic studies, pulse oximetry, re-evaluation of patient's condition, obtaining history from patient or surrogate and review of old charts     Medications Ordered in ED Medications  naloxone (NARCAN) 2 MG/2ML injection (has no administration in time range)  sodium chloride 0.9 % bolus 500 mL (has no administration in time range)    Followed by  0.9 %  sodium chloride infusion (has no administration in time range)  etomidate (AMIDATE) injection 20 mg (20 mg Intravenous Given 11/26/20 1749)  succinylcholine (ANECTINE) injection 120 mg (120 mg Intravenous Given 11/26/20 1750)  naloxone (NARCAN) injection 1 mg (1 mg Intravenous Given 11/26/20 1746)  0.9 %  sodium chloride infusion ( Intravenous New Bag/Given 11/26/20 1748)     ED Course  I have reviewed the triage vital signs and the nursing notes.  Pertinent labs & imaging results that were available during my care of the patient were reviewed by me and considered in my medical decision making (see chart for details).  Clinical Course as of 11/27/20 1452  Wed Nov 26, 2020  1759 Patient intubated for altered mental status and airway protection. [BT]  1939 Urinalysis does show many bacteria greater than 50 RBCs and white blood cells.  Suggest possible infection. [JK]  1939 Initial ABG does show evidence of respiratory acidosis. [JK]  1940 CBC is unremarkable.  Metabolic panel unremarkable. [JK]  1940 CT without acute findings [JK]  1941 ET tube placement reviewed.  Will ask RT to pull back 2 cm [JK]  1941 NG tube also needs to be positioned [JK]  2038 Patient trying to remove the tube.  He does seem more alert.  Will try to manage his sedative with his blood pressure will place restraints [JK]  2054 Discussed case with Dr. Earlie Server critical care.  Feels that patient can be managed here at [facility].  Recommends I try consulting the hospitalist. [JK]  2054 Patient's most recent blood pressure at the bedside is 474 systolic. [JK]    Clinical Course User Index [JK] Dorie Rank, MD   MDM Rules/Calculators/A&P                         Patient presented to the ED for evaluation of shortness of breath per EMS.  Patient did inform the EMS that he has been drinking heavily.  He had an abrupt decline in his mental status.  Initial evaluation GCS was 3.  Patient was intubated for airway control.  Patient's ED work-up was notable for urinary tract infection.  Patient also had evidence of severe alcohol intoxication.  No Signs of acute CNS findings on CT scan.  Patient did have episodes of hypotension following his sedation.  MOst likely related to the sedation and alcohol However sepsis was also concern considering UTI.  Patient was started on broad-spectrum antibiotics.  IV  fluid boluses were ordered.  Critical care and medical service was consulted.  Patient was admitted for further treatment Final Clinical Impression(s) / ED Diagnoses Final diagnoses:  Acute cystitis without hematuria  Altered mental status, unspecified altered mental status type  Respiratory acidosis  Alcoholic intoxication with complication [facility])      Dorie Rank, MD 11/27/20 1457

## 2020-11-26 NOTE — ED Notes (Signed)
Dr. Clearence Ped to bedside, aware that pt does not have OGT or NGT in place at this time due to how alert and responsive he has been during attempts, aware of VS. Precedex IV infusion to be initiated, hold on neosynephrine at this time, BP WNL.

## 2020-11-26 NOTE — ED Notes (Signed)
Pt intubated with 8 ET tube without difficulty x 1 attempt by Dr Tomi Bamberger, 27 at lip, + color change noted, bilateral breath sounds noted.

## 2020-11-26 NOTE — Progress Notes (Signed)
Increased vt to 620 per pt 8cc/kg

## 2020-11-26 NOTE — ED Notes (Signed)
Pt eyes open, restless and agitated, EDP at bedside, bilateral soft wrist restraints initiated per verbal order of Dr. Hillard Danker. Verbal orders for 1mg  versed IV STAT

## 2020-11-26 NOTE — ED Notes (Signed)
Date and time results received: 11/26/20 2054  Test: alcohol, lactic acid Critical Value: alcohol 365, lactic acid 2.6  Name of Provider Notified: Dr. Hillard Danker  Orders Received? Or Actions Taken?: no new orders at this time

## 2020-11-26 NOTE — ED Notes (Signed)
Multiple attempts for NGT or OGT without success, admitting RN Einar Pheasant made aware. Orders placed for KUB once tube can be placed, pt to be transported.

## 2020-11-26 NOTE — ED Notes (Signed)
Pt BP decreasing despite titrating down propofol infusion, may turn off propofol as appropriate per Dr. Tomi Bamberger. EDP at bedside, son at bedside being updated by EDP. Labs drawn. EDP aware of VS.

## 2020-11-27 ENCOUNTER — Encounter (HOSPITAL_COMMUNITY): Payer: Self-pay | Admitting: Family Medicine

## 2020-11-27 ENCOUNTER — Inpatient Hospital Stay (HOSPITAL_COMMUNITY): Payer: Medicare HMO

## 2020-11-27 DIAGNOSIS — N390 Urinary tract infection, site not specified: Secondary | ICD-10-CM | POA: Diagnosis not present

## 2020-11-27 DIAGNOSIS — F10929 Alcohol use, unspecified with intoxication, unspecified: Secondary | ICD-10-CM

## 2020-11-27 DIAGNOSIS — G9341 Metabolic encephalopathy: Secondary | ICD-10-CM | POA: Diagnosis not present

## 2020-11-27 DIAGNOSIS — I952 Hypotension due to drugs: Secondary | ICD-10-CM | POA: Diagnosis not present

## 2020-11-27 DIAGNOSIS — J189 Pneumonia, unspecified organism: Secondary | ICD-10-CM

## 2020-11-27 DIAGNOSIS — J9602 Acute respiratory failure with hypercapnia: Secondary | ICD-10-CM

## 2020-11-27 DIAGNOSIS — E872 Acidosis, unspecified: Secondary | ICD-10-CM

## 2020-11-27 DIAGNOSIS — J9601 Acute respiratory failure with hypoxia: Secondary | ICD-10-CM

## 2020-11-27 DIAGNOSIS — E878 Other disorders of electrolyte and fluid balance, not elsewhere classified: Secondary | ICD-10-CM | POA: Diagnosis present

## 2020-11-27 HISTORY — DX: Pneumonia, unspecified organism: J18.9

## 2020-11-27 HISTORY — DX: Acidosis, unspecified: E87.20

## 2020-11-27 HISTORY — DX: Acidosis: E87.2

## 2020-11-27 HISTORY — DX: Metabolic encephalopathy: G93.41

## 2020-11-27 LAB — BLOOD GAS, ARTERIAL
Acid-Base Excess: 1.1 mmol/L (ref 0.0–2.0)
Bicarbonate: 26.2 mmol/L (ref 20.0–28.0)
Drawn by: 35043
FIO2: 50
O2 Saturation: 95.1 %
Patient temperature: 36.5
pCO2 arterial: 30.7 mmHg — ABNORMAL LOW (ref 32.0–48.0)
pH, Arterial: 7.505 — ABNORMAL HIGH (ref 7.350–7.450)
pO2, Arterial: 75.4 mmHg — ABNORMAL LOW (ref 83.0–108.0)

## 2020-11-27 LAB — CBC WITH DIFFERENTIAL/PLATELET
Abs Immature Granulocytes: 0.02 10*3/uL (ref 0.00–0.07)
Basophils Absolute: 0.1 10*3/uL (ref 0.0–0.1)
Basophils Relative: 1 %
Eosinophils Absolute: 0.1 10*3/uL (ref 0.0–0.5)
Eosinophils Relative: 1 %
HCT: 50.5 % (ref 39.0–52.0)
Hemoglobin: 17.1 g/dL — ABNORMAL HIGH (ref 13.0–17.0)
Immature Granulocytes: 0 %
Lymphocytes Relative: 23 %
Lymphs Abs: 2.1 10*3/uL (ref 0.7–4.0)
MCH: 34.1 pg — ABNORMAL HIGH (ref 26.0–34.0)
MCHC: 33.9 g/dL (ref 30.0–36.0)
MCV: 100.6 fL — ABNORMAL HIGH (ref 80.0–100.0)
Monocytes Absolute: 0.9 10*3/uL (ref 0.1–1.0)
Monocytes Relative: 10 %
Neutro Abs: 5.9 10*3/uL (ref 1.7–7.7)
Neutrophils Relative %: 65 %
Platelets: 139 10*3/uL — ABNORMAL LOW (ref 150–400)
RBC: 5.02 MIL/uL (ref 4.22–5.81)
RDW: 16 % — ABNORMAL HIGH (ref 11.5–15.5)
WBC: 9.1 10*3/uL (ref 4.0–10.5)
nRBC: 0 % (ref 0.0–0.2)

## 2020-11-27 LAB — COMPREHENSIVE METABOLIC PANEL
ALT: 12 U/L (ref 0–44)
AST: 21 U/L (ref 15–41)
Albumin: 2.7 g/dL — ABNORMAL LOW (ref 3.5–5.0)
Alkaline Phosphatase: 81 U/L (ref 38–126)
Anion gap: 14 (ref 5–15)
BUN: 9 mg/dL (ref 8–23)
CO2: 24 mmol/L (ref 22–32)
Calcium: 7.9 mg/dL — ABNORMAL LOW (ref 8.9–10.3)
Chloride: 98 mmol/L (ref 98–111)
Creatinine, Ser: 0.52 mg/dL — ABNORMAL LOW (ref 0.61–1.24)
GFR, Estimated: >60 mL/min (ref >60)
Glucose, Bld: 67 mg/dL — ABNORMAL LOW (ref 70–99)
Potassium: 4 mmol/L (ref 3.5–5.1)
Sodium: 136 mmol/L (ref 135–145)
Total Bilirubin: 0.9 mg/dL (ref 0.3–1.2)
Total Protein: 5.9 g/dL — ABNORMAL LOW (ref 6.5–8.1)

## 2020-11-27 LAB — MRSA PCR SCREENING: MRSA by PCR: NEGATIVE

## 2020-11-27 LAB — GLUCOSE, CAPILLARY
Glucose-Capillary: 75 mg/dL (ref 70–99)
Glucose-Capillary: 87 mg/dL (ref 70–99)
Glucose-Capillary: 213 mg/dL — ABNORMAL HIGH (ref 70–99)

## 2020-11-27 LAB — LACTIC ACID, PLASMA: Lactic Acid, Venous: 3.2 mmol/L (ref 0.5–1.9)

## 2020-11-27 LAB — MAGNESIUM: Magnesium: 1.5 mg/dL — ABNORMAL LOW (ref 1.7–2.4)

## 2020-11-27 LAB — PHOSPHORUS: Phosphorus: 1.9 mg/dL — ABNORMAL LOW (ref 2.5–4.6)

## 2020-11-27 LAB — TSH: TSH: 1.385 u[IU]/mL (ref 0.350–4.500)

## 2020-11-27 MED ORDER — ONDANSETRON HCL 4 MG PO TABS: 4.0000 mg | ORAL_TABLET | Freq: Four times a day (QID) | ORAL | Status: DC | PRN

## 2020-11-27 MED ORDER — HEPARIN SODIUM (PORCINE) 5000 UNIT/ML IJ SOLN
5000.0000 [IU] | Freq: Three times a day (TID) | INTRAMUSCULAR | Status: DC
  Administered 2020-11-27 – 2020-12-03 (×19): 5000 [IU] via SUBCUTANEOUS
  Filled 2020-11-27 (×20): qty 1

## 2020-11-27 MED ORDER — NICOTINE 21 MG/24HR TD PT24
21.0000 mg | MEDICATED_PATCH | Freq: Every day | TRANSDERMAL | Status: DC | PRN
  Administered 2020-11-29 – 2020-12-02 (×4): 21 mg via TRANSDERMAL
  Filled 2020-11-27 (×4): qty 1

## 2020-11-27 MED ORDER — BISACODYL 10 MG RE SUPP: 10.0000 mg | Freq: Every day | RECTAL | Status: DC | PRN

## 2020-11-27 MED ORDER — IPRATROPIUM-ALBUTEROL 0.5-2.5 (3) MG/3ML IN SOLN: 3.0000 mL | RESPIRATORY_TRACT | Status: DC | PRN

## 2020-11-27 MED ORDER — ORAL CARE MOUTH RINSE
15.0000 mL | OROMUCOSAL | Status: DC
  Administered 2020-11-27 (×2): 15 mL via OROMUCOSAL

## 2020-11-27 MED ORDER — ACETAMINOPHEN 325 MG PO TABS: 650.0000 mg | ORAL_TABLET | Freq: Four times a day (QID) | ORAL | Status: DC | PRN

## 2020-11-27 MED ORDER — METOPROLOL TARTRATE 5 MG/5ML IV SOLN: 5.0000 mg | Freq: Four times a day (QID) | INTRAVENOUS | Status: DC | PRN

## 2020-11-27 MED ORDER — SODIUM CHLORIDE 0.9 % IV SOLN
500.0000 mg | INTRAVENOUS | Status: DC
  Administered 2020-11-27 – 2020-11-29 (×3): 500 mg via INTRAVENOUS
  Filled 2020-11-27 (×3): qty 500

## 2020-11-27 MED ORDER — ACETAMINOPHEN 650 MG RE SUPP: 650.0000 mg | Freq: Four times a day (QID) | RECTAL | Status: DC | PRN

## 2020-11-27 MED ORDER — IOHEXOL 300 MG/ML  SOLN
100.0000 mL | Freq: Once | INTRAMUSCULAR | Status: AC | PRN
  Administered 2020-11-27: 100 mL via INTRAVENOUS

## 2020-11-27 MED ORDER — CHLORHEXIDINE GLUCONATE 0.12% ORAL RINSE (MEDLINE KIT)
15.0000 mL | Freq: Two times a day (BID) | OROMUCOSAL | Status: DC
  Administered 2020-11-27: 15 mL via OROMUCOSAL

## 2020-11-27 MED ORDER — SODIUM CHLORIDE 0.9 % IV SOLN
1.0000 g | INTRAVENOUS | Status: DC
  Administered 2020-11-27 – 2020-11-28 (×2): 1 g via INTRAVENOUS
  Filled 2020-11-27 (×2): qty 10

## 2020-11-27 MED ORDER — MORPHINE SULFATE (PF) 2 MG/ML IV SOLN: 2.0000 mg | INTRAVENOUS | Status: DC | PRN

## 2020-11-27 MED ORDER — DEXMEDETOMIDINE HCL IN NACL 400 MCG/100ML IV SOLN
0.4000 ug/kg/h | INTRAVENOUS | Status: DC
  Administered 2020-11-27 – 2020-11-28 (×3): 0.5 ug/kg/h via INTRAVENOUS
  Filled 2020-11-27 (×4): qty 100

## 2020-11-27 MED ORDER — ONDANSETRON HCL 4 MG/2ML IJ SOLN: 4.0000 mg | Freq: Four times a day (QID) | INTRAMUSCULAR | Status: DC | PRN

## 2020-11-27 MED ORDER — ORAL CARE MOUTH RINSE
15.0000 mL | Freq: Two times a day (BID) | OROMUCOSAL | Status: DC
  Administered 2020-11-27 – 2020-12-03 (×12): 15 mL via OROMUCOSAL

## 2020-11-27 MED ORDER — CHLORHEXIDINE GLUCONATE CLOTH 2 % EX PADS
6.0000 | MEDICATED_PAD | Freq: Every day | CUTANEOUS | Status: DC
  Administered 2020-11-27 – 2020-11-28 (×2): 6 via TOPICAL

## 2020-11-27 NOTE — Progress Notes (Signed)
[facility] Progress Note Patient Name: Steven Howard DOB: 02-07-1951 MRN: [id number]   Date of Service  11/27/2020  HPI/Events of Note  Acute hypercarbic/hypoxic  resp failure Intubated for GCS 3 CAP - Lt basal infx Lactic acidosis -2.6--> 10.6-->3.2 ? etio ETOH intoxication 365, head CT neg  eICU Interventions  Sedation/ vent orders reviewed - ABG improved , peak pr 23 , no autoPEEP Withdraw ETT 2 cm, at carina Await CT abdomen PCCM to consult in am     Intervention Category Evaluation Type: New Patient Evaluation  [name] V. [name] 11/27/2020, 2:11 AM

## 2020-11-27 NOTE — Progress Notes (Signed)
Transported patient to and from CT without incident.

## 2020-11-27 NOTE — Progress Notes (Signed)
RT performed weaning parameters on patient. Patient performed -60 NIF AND FVC 2.8L. MD notified of results and gave orders to extubate patient.Patient extubated at 1300hr to 5L O2 via nasal cannula. RN at bedside, no complications noted. Patient HR 81, RR 17 and SATs 90%. RT will continue to monitor and assess

## 2020-11-27 NOTE — Progress Notes (Signed)
Patient seen and examined.  Admitted after midnight secondary to acute hypoxic and hypercapnic respiratory failure in the setting of alcohol intoxication and lack of compliance with CPAP.  Currently hemodynamically stable, not following commands and has been able to safely been extubated.  Diet will be advanced to clear liquids, continue the use of Precedex and as needed/nightly use of CPAP.  Please refer to H&P written by Dr. Clearence Ped for further info/details.   Plan: -QHS CPAP -follow resp status and clinical response -continue precedex -continue thiamine and folic acid  Barton Dubois MD [phone]

## 2020-11-27 NOTE — ED Notes (Signed)
Pt resting in bed, intubated at this time. Foley in place draining without complication. On cardiac monitoring and pulse oximetry.

## 2020-11-27 NOTE — Progress Notes (Signed)
Patient's tube was almost to 28cm.  Moved patient's tube back to 25 cm due to fact patient had been charted at 26cm per MD order.  Good BS bilaterally.

## 2020-11-27 NOTE — H&P (Signed)
TRH H&P    Patient Demographics:    Steven Howard, is a 70 y.o. male  MRN: [id number]  DOB - 22-Sep-1950  Admit Date - 11/26/2020  Referring MD/NP/PA: Tomi Bamberger  Outpatient Primary MD for the patient is Claretta Fraise, MD  Patient coming from: Home  Chief complaint- Dyspnea/ intoxication   HPI:    Steven Howard  is a 70 y.o. male, with history of sleep apnea, glaucoma, COPD, cancer, presents the ED with a chief complaint of "breathing issues."  History is unclear, but this shortness of breath seems to be the reason that EMS was called out to the home.  When EMS arrived there is reported that he said he was not having any trouble breathing he was just drinking.  Family members had reported that his pulse ox was low, but was able to convince him to be transported by EMS to the hospital.  Patient was initially at his baseline and then had an abrupt change in mental status while with EMS.  Lection saturations dropped and they put him on a nonrebreather, patient became unresponsive.  When he arrived to the ED his oxygen saturations were in the 80s on a nonrebreather, patient had a GCS of 3.  Patient was intubated to protect his airway.  No further history could be obtained.  In the ED Temp 96.7, heart rate 71-119 at time of admission heart rate is 72, blood pressure as low as systolic 10F over 75Z this is likely secondary to propofol as blood pressure normalized when propofol was switched to Precedex, respiratory rate 15-24, O2 saturations 84% to 100%  UA was indicative of UTI, Rocephin started Urine culture and blood culture pending Covid negative No leukocytosis with a white blood cell count of 9.3, hemoglobin 17.5, hemoconcentration?  Platelets 189 Chemistry panel shows hyponatremia at 132, hypochloremia at 90 Patient was given 1 L of normal saline, 1 L of LR, 500 of normal saline Patient was given fentanyl, Versed,  propofol, succinylcholine for intubation sedating purposes After patient was intubated he became more responsive and was trying to pull out his tubes who is put in nonviolent restraints Lactic acid was initially elevated at 2.6, and then repeat 10.6 Alcohol level was 365 UDS negative Initial ABG showed a pH of 7.3 and a hypercapnia at 57 Repeat ABG showed pH of 7.45, PCO2 of 39 Admission requested for further work-up of altered mental status and respiratory failure   Review of systems:    Review of systems could not be performed as patient is intubated.    Past History of the following :    Past Medical History:  Diagnosis Date  . Cancer [facility]) 2011   Prostate  . COPD (chronic obstructive pulmonary disease) ([place])   . Glaucoma   . Sleep apnea       Past Surgical History:  Procedure Laterality Date  . 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  PROSTATE SURGERY  2011   seeds implanted/radiation      Social History:      Social History   Tobacco Use  . Smoking status: Current Every Day Smoker    Packs/day: 0.25    Years: 55.00    Pack years: 13.75    Types: Cigars, Cigarettes  . Smokeless tobacco: Never Used  Substance Use Topics  . Alcohol use: Yes    Comment: occasional       Family History :     Family History  Problem Relation Age of Onset  . Stroke Mother   . Alcohol abuse Brother   . Heart disease Maternal Grandmother   . COPD Brother       Home Medications:   Prior to Admission medications   Medication Sig Start Date End Date Taking? Authorizing Provider  albuterol (VENTOLIN HFA) 108 (90 Base) MCG/ACT inhaler Inhale 2 puffs into the lungs every 4 (four) hours as needed for wheezing or shortness of breath (cough, shortness of breath or wheezing.). 06/03/20   Johnson, Clanford L,  MD  BREO ELLIPTA 100-25 MCG/INH AEPB INHALE 1 PUFF INTO LUNGS DAILY 02/28/20   Claretta Fraise, MD  docusate sodium (COLACE) 100 MG capsule Take 1 capsule (100 mg total) by mouth daily. 06/03/20   Johnson, Clanford L, MD  furosemide (LASIX) 40 MG tablet Take 1 tablet (40 mg total) by mouth daily. Patient not taking: Reported on 11/25/2020 06/08/20   TatShanon Brow, MD  ipratropium-albuterol (DUONEB) 0.5-2.5 (3) MG/3ML SOLN Take 3 mLs by nebulization every 4 (four) hours as needed. 06/03/20   Johnson, Clanford L, MD  metoprolol tartrate (LOPRESSOR) 25 MG tablet Take 0.5 tablets (12.5 mg total) by mouth 2 (two) times daily. Patient not taking: Reported on 11/25/2020 06/07/20   Orson Eva, MD  Multiple Vitamin (MULTIVITAMIN WITH MINERALS) TABS tablet Take 1 tablet by mouth daily. 06/03/20   Johnson, Clanford L, MD  nicotine (NICODERM CQ - DOSED IN MG/24 HOURS) 21 mg/24hr patch Place 1 patch (21 mg total) onto the skin daily as needed (nicotine cravings). Patient not taking: Reported on 06/20/2020 06/03/20   Irwin Brakeman L, MD  polyethylene glycol (MIRALAX / GLYCOLAX) 17 g packet Take 17 g by mouth daily as needed for mild constipation. Patient not taking: Reported on 06/20/2020 06/03/20   Irwin Brakeman L, MD  potassium chloride SA (KLOR-CON) 20 MEQ tablet Take 1 tablet (20 mEq total) by mouth daily. Patient not taking: Reported on 11/25/2020 06/08/20   Orson Eva, MD  sulfamethoxazole-trimethoprim (BACTRIM DS) 800-160 MG tablet Take 1 tablet by mouth 2 (two) times daily. 11/25/20   Ivy Lynn, NP     Allergies:    No Known Allergies   Physical Exam:   Vitals  Blood pressure 103/72, pulse 79, temperature (!) 96.6 F (35.9 C), temperature source Rectal, resp. rate 18, height 6' (1.829 m), weight 90.7 kg, SpO2 100 %.  1.  General: Patient lying supine in bed, resting comfortably on ventilator  2. Psychiatric Cannot be assessed as patient is ventilated  3. Neurologic: Face is symmetric,  bilateral pupils are sluggish, no further assessment can be done as patient is sedated and on mechanical vent  4. HEENMT:  Head is atraumatic, normocephalic, pupils are sluggish but reactive, neck is supple, trachea is midline, mucous membranes are moist, tearing from the right eye,  5. Respiratory : Rhonchi present bilaterally, no wheezes or crackles, no accessory muscle use, no increased work of breathing, no fighting the vent,  clubbing present  6. Cardiovascular : Heart rate is normal, rhythm is regular, no murmurs rubs or gallops, no peripheral edema, peripheral pulses palpated  7. Gastrointestinal:  Abdomen is soft, no masses palpated, no grimace to palpation, bowel sounds active,   Urinary catheter in place, dark urine in Foley bag  8. Skin:  Venous stasis changes in the lower extremities bilaterally, skin is otherwise warm dry and intact without acute lesion  9.Musculoskeletal:  No acute deformity, peripheral pulses palpated, no peripheral edema    Data Review:    CBC Recent Labs  Lab 11/26/20 1740  WBC 9.3  HGB 17.5*  HCT 51.6  PLT 189  MCV 100.8*  MCH 34.2*  MCHC 33.9  RDW 16.0*  LYMPHSABS 4.7*  MONOABS 0.8  EOSABS 0.1  BASOSABS 0.1   ------------------------------------------------------------------------------------------------------------------  Results for orders placed or performed during the hospital encounter of 11/26/20 (from the past 48 hour(s))  Rapid urine drug screen (hospital performed)     Status: None   Collection Time: 11/26/20  4:05 PM  Result Value Ref Range   Opiates NONE DETECTED NONE DETECTED   Cocaine NONE DETECTED NONE DETECTED   Benzodiazepines NONE DETECTED NONE DETECTED   Amphetamines NONE DETECTED NONE DETECTED   Tetrahydrocannabinol NONE DETECTED NONE DETECTED   Barbiturates NONE DETECTED NONE DETECTED    Comment: (NOTE) DRUG SCREEN FOR MEDICAL PURPOSES ONLY.  IF CONFIRMATION IS NEEDED FOR ANY PURPOSE, NOTIFY LAB WITHIN 5  DAYS.  LOWEST DETECTABLE LIMITS FOR URINE DRUG SCREEN Drug Class                     Cutoff (ng/mL) Amphetamine and metabolites    1000 Barbiturate and metabolites    200 Benzodiazepine                 366 Tricyclics and metabolites     300 Opiates and metabolites        300 Cocaine and metabolites        300 THC                            50 Performed at [facility], [address], [place], [place] [address]   CBC with Differential     Status: Abnormal   Collection Time: 11/26/20  5:40 PM  Result Value Ref Range   WBC 9.3 4.0 - 10.5 K/uL   RBC 5.12 4.22 - 5.81 MIL/uL   Hemoglobin 17.5 (H) 13.0 - 17.0 g/dL   HCT 51.6 39.0 - 52.0 %   MCV 100.8 (H) 80.0 - 100.0 fL   MCH 34.2 (H) 26.0 - 34.0 pg   MCHC 33.9 30.0 - 36.0 g/dL   RDW 16.0 (H) 11.5 - 15.5 %   Platelets 189 150 - 400 K/uL   nRBC 0.2 0.0 - 0.2 %   Neutrophils Relative % 38 %   Neutro Abs 3.6 1.7 - 7.7 K/uL   Lymphocytes Relative 51 %   Lymphs Abs 4.7 (H) 0.7 - 4.0 K/uL   Monocytes Relative 9 %   Monocytes Absolute 0.8 0.1 - 1.0 K/uL   Eosinophils Relative 1 %   Eosinophils Absolute 0.1 0.0 - 0.5 K/uL   Basophils Relative 1 %   Basophils Absolute 0.1 0.0 - 0.1 K/uL   Immature Granulocytes 0 %   Abs Immature Granulocytes 0.03 0.00 - 0.07 K/uL    Comment: Performed at [facility], [address], [place], [place]  [address]  Comprehensive metabolic panel     Status: Abnormal   Collection Time: 11/26/20  5:40 PM  Result Value Ref Range   Sodium 132 (L) 135 - 145 mmol/L   Potassium 4.1 3.5 - 5.1 mmol/L   Chloride 90 (L) 98 - 111 mmol/L   CO2 30 22 - 32 mmol/L   Glucose, Bld 108 (H) 70 - 99 mg/dL    Comment: Glucose reference range applies only to samples taken after fasting for at least 8 hours.   BUN 13 8 - 23 mg/dL   Creatinine, Ser 0.64 0.61 - 1.24 mg/dL   Calcium 8.3 (L) 8.9 - 10.3 mg/dL   Total Protein 6.7 6.5 - 8.1 g/dL   Albumin 3.3 (L) 3.5 - 5.0 g/dL   AST 26 15 - 41 U/L   ALT 13 0 - 44 U/L   Alkaline  Phosphatase 88 38 - 126 U/L   Total Bilirubin 0.3 0.3 - 1.2 mg/dL   GFR, Estimated >60 >60 mL/min    Comment: (NOTE) Calculated using the CKD-EPI Creatinine Equation (2021)    Anion gap 12 5 - 15    Comment: Performed at [facility], [address] [place], [place] [address]  Troponin I (High Sensitivity)     Status: None   Collection Time: 11/26/20  5:40 PM  Result Value Ref Range   Troponin I (High Sensitivity) 12 <18 ng/L    Comment: (NOTE) Elevated high sensitivity troponin I (hsTnI) values and significant  changes across serial measurements may suggest ACS but many other  chronic and acute conditions are known to elevate hsTnI results.  Refer to the "Links" section for chest pain algorithms and additional  guidance. Performed at [facility], [address], [place],  [address]   CBG monitoring, ED     Status: Abnormal   Collection Time: 11/26/20  5:42 PM  Result Value Ref Range   Glucose-Capillary 135 (H) 70 - 99 mg/dL    Comment: Glucose reference range applies only to samples taken after fasting for at least 8 hours.  Resp Panel by RT-PCR (Flu A&B, Covid) Nasopharyngeal Swab     Status: None   Collection Time: 11/26/20  5:56 PM   Specimen: Nasopharyngeal Swab; Nasopharyngeal(NP) swabs in vial transport medium  Result Value Ref Range   SARS Coronavirus 2 by RT PCR NEGATIVE NEGATIVE    Comment: (NOTE) SARS-CoV-2 target nucleic acids are NOT DETECTED.  The SARS-CoV-2 RNA is generally detectable in upper respiratory specimens during the acute phase of infection. The lowest concentration of SARS-CoV-2 viral copies this assay can detect is 138 copies/mL. A negative result does not preclude SARS-Cov-2 infection and should not be used as the sole basis for treatment or other patient management decisions. A negative result may occur with  improper specimen collection/handling, submission of specimen other than nasopharyngeal swab, presence of viral mutation(s) within  the areas targeted by this assay, and inadequate number of viral copies(<138 copies/mL). A negative result must be combined with clinical observations, patient history, and epidemiological information. The expected result is Negative.  Fact Sheet for Patients:  EntrepreneurPulse.com.au  Fact Sheet for Healthcare Providers:  IncredibleEmployment.be  This test is no t yet approved or cleared by the [place] FDA and  has been authorized for detection and/or diagnosis of SARS-CoV-2 by FDA under an Emergency Use Authorization (EUA). This EUA will remain  in effect (meaning this test can be used) for the duration of the COVID-19 declaration under Section 564(b)(1) of  the Act, 21 U.S.C.section 360bbb-3(b)(1), unless the authorization is terminated  or revoked sooner.       Influenza A by PCR NEGATIVE NEGATIVE   Influenza B by PCR NEGATIVE NEGATIVE    Comment: (NOTE) The Xpert Xpress SARS-CoV-2/FLU/RSV plus assay is intended as an aid in the diagnosis of influenza from Nasopharyngeal swab specimens and should not be used as a sole basis for treatment. Nasal washings and aspirates are unacceptable for Xpert Xpress SARS-CoV-2/FLU/RSV testing.  Fact Sheet for Patients: EntrepreneurPulse.com.au  Fact Sheet for Healthcare Providers: IncredibleEmployment.be  This test is not yet approved or cleared by the [place] FDA and has been authorized for detection and/or diagnosis of SARS-CoV-2 by FDA under an Emergency Use Authorization (EUA). This EUA will remain in effect (meaning this test can be used) for the duration of the COVID-19 declaration under Section 564(b)(1) of the Act, 21 U.S.C. section 360bbb-3(b)(1), unless the authorization is terminated or revoked.  Performed at [facility], [address], [place], [place] [address]   Protime-INR     Status: None   Collection Time: 11/26/20  6:04 PM  Result  Value Ref Range   Prothrombin Time 12.9 11.4 - 15.2 seconds   INR 1.0 0.8 - 1.2    Comment: (NOTE) INR goal varies based on device and disease states. Performed at [facility], [address], [place], [place] [address]   APTT     Status: None   Collection Time: 11/26/20  6:04 PM  Result Value Ref Range   aPTT 30 24 - 36 seconds    Comment: Performed at [facility], [address] [place], [place] [address]  Urinalysis, Routine w reflex microscopic Urine, Catheterized     Status: Abnormal   Collection Time: 11/26/20  6:05 PM  Result Value Ref Range   Color, Urine YELLOW YELLOW   APPearance CLOUDY (A) CLEAR   Specific Gravity, Urine 1.018 1.005 - 1.030   pH 6.0 5.0 - 8.0   Glucose, UA NEGATIVE NEGATIVE mg/dL   Hgb urine dipstick MODERATE (A) NEGATIVE   Bilirubin Urine NEGATIVE NEGATIVE   Ketones, ur 5 (A) NEGATIVE mg/dL   Protein, ur 100 (A) NEGATIVE mg/dL   Nitrite NEGATIVE NEGATIVE   Leukocytes,Ua SMALL (A) NEGATIVE   RBC / HPF >50 (H) 0 - 5 RBC/hpf   WBC, UA >50 (H) 0 - 5 WBC/hpf   Bacteria, UA MANY (A) NONE SEEN   Squamous Epithelial / LPF 0-5 0 - 5   WBC Clumps PRESENT    Mucus PRESENT    Hyaline Casts, UA PRESENT    Amorphous Crystal PRESENT     Comment: Performed at [facility], [address] [place], [place] [address]  Blood gas, arterial (at [facility])     Status: Abnormal   Collection Time: 11/26/20  6:40 PM  Result Value Ref Range   FIO2 60.00    pH, Arterial 7.310 (L) 7.350 - 7.450   pCO2 arterial 57.2 (H) 32.0 - 48.0 mmHg   pO2, Arterial 168 (H) 83.0 - 108.0 mmHg   Bicarbonate 24.7 20.0 - 28.0 mmol/L   Acid-Base Excess 2.5 (H) 0.0 - 2.0 mmol/L   O2 Saturation 98.1 %   Patient temperature 35.9    Allens test (pass/fail) PASS PASS    Comment: Performed at [facility], [address], [place], [place] [address]  Lactic acid, plasma     Status: Abnormal   Collection Time: 11/26/20  7:46 PM  Result Value Ref Range  Lactic Acid, Venous 2.6 (HH) 0.5 - 1.9  mmol/L    Comment: CRITICAL RESULT CALLED TO, READ BACK BY AND VERIFIED WITH: HARRY,A ON 11/26/20 AT 2050 BY LOY,C Performed at [facility], [address] [place], [place] [address]   Ethanol     Status: Abnormal   Collection Time: 11/26/20  7:50 PM  Result Value Ref Range   Alcohol, Ethyl (B) 365 (HH) <10 mg/dL    Comment: CRITICAL RESULT CALLED TO, READ BACK BY AND VERIFIED WITH: HARRY,A ON 11/26/20 AT 2050 BY LOY,C (NOTE) Lowest detectable limit for serum alcohol is 10 mg/dL.  For medical purposes only. Performed at [facility], [address], [place], [place] [address]   Troponin I (High Sensitivity)     Status: None   Collection Time: 11/26/20  7:50 PM  Result Value Ref Range   Troponin I (High Sensitivity) 12 <18 ng/L    Comment: (NOTE) Elevated high sensitivity troponin I (hsTnI) values and significant  changes across serial measurements may suggest ACS but many other  chronic and acute conditions are known to elevate hsTnI results.  Refer to the "Links" section for chest pain algorithms and additional  guidance. Performed at [facility], [address] [place], [place] [address]   Blood gas, arterial (at [facility])     Status: Abnormal   Collection Time: 11/26/20  8:14 PM  Result Value Ref Range   FIO2 60.00    Delivery systems VENTILATOR    Mode PRESSURE REGULATED VOLUME CONTROL    pH, Arterial 7.456 (H) 7.350 - 7.450   pCO2 arterial 39.1 32.0 - 48.0 mmHg   pO2, Arterial 113 (H) 83.0 - 108.0 mmHg   Bicarbonate 27.5 20.0 - 28.0 mmol/L   Acid-Base Excess 3.6 (H) 0.0 - 2.0 mmol/L   O2 Saturation 97.4 %   Patient temperature 35.9    Allens test (pass/fail) PASS PASS    Comment: Performed at [facility], [address], [place], [place] [address]  Blood culture (routine x 2)     Status: None (Preliminary result)   Collection Time: 11/26/20  8:38 PM   Specimen: Left Antecubital; Blood  Result Value Ref Range   Specimen Description LEFT ANTECUBITAL    Special  Requests      BOTTLES DRAWN AEROBIC AND ANAEROBIC Blood Culture adequate volume Performed at [facility], [address], [place], [place] [address]    Culture PENDING    Report Status PENDING   Lactic acid, plasma     Status: Abnormal   Collection Time: 11/26/20 10:19 PM  Result Value Ref Range   Lactic Acid, Venous 10.6 (HH) 0.5 - 1.9 mmol/L    Comment: CRITICAL VALUE NOTED.  VALUE IS CONSISTENT WITH PREVIOUSLY REPORTED AND CALLED VALUE. Performed at [facility], [address], [place], [place] [address]     Chemistries  Recent Labs  Lab 11/26/20 1740  NA 132*  K 4.1  CL 90*  CO2 30  GLUCOSE 108*  BUN 13  CREATININE 0.64  CALCIUM 8.3*  AST 26  ALT 13  ALKPHOS 88  BILITOT 0.3   ------------------------------------------------------------------------------------------------------------------  ------------------------------------------------------------------------------------------------------------------ GFR: Estimated Creatinine Clearance: 93 mL/min (by C-G formula based on SCr of 0.64 mg/dL). Liver Function Tests: Recent Labs  Lab 11/26/20 1740  AST 26  ALT 13  ALKPHOS 88  BILITOT 0.3  PROT 6.7  ALBUMIN 3.3*   No results for input(s): LIPASE, AMYLASE in the last 168 hours. No results for input(s): AMMONIA in the  last 168 hours. Coagulation Profile: Recent Labs  Lab 11/26/20 1804  INR 1.0   Cardiac Enzymes: No results for input(s): CKTOTAL, CKMB, CKMBINDEX, TROPONINI in the last 168 hours. BNP (last 3 results) No results for input(s): PROBNP in the last 8760 hours. HbA1C: No results for input(s): HGBA1C in the last 72 hours. CBG: Recent Labs  Lab 11/26/20 1742  GLUCAP 135*   Lipid Profile: No results for input(s): CHOL, HDL, LDLCALC, TRIG, CHOLHDL, LDLDIRECT in the last 72 hours. Thyroid Function Tests: No results for input(s): TSH, T4TOTAL, FREET4, T3FREE, THYROIDAB in the last 72 hours. Anemia Panel: No results for input(s): VITAMINB12,  FOLATE, FERRITIN, TIBC, IRON, RETICCTPCT in the last 72 hours.  --------------------------------------------------------------------------------------------------------------- Urine analysis:    Component Value Date/Time   COLORURINE YELLOW 11/26/2020 1805   APPEARANCEUR CLOUDY (A) 11/26/2020 1805   APPEARANCEUR Hazy (A) 07/03/2020 1415   LABSPEC 1.018 11/26/2020 1805   PHURINE 6.0 11/26/2020 1805   GLUCOSEU NEGATIVE 11/26/2020 1805   HGBUR MODERATE (A) 11/26/2020 1805   BILIRUBINUR NEGATIVE 11/26/2020 1805   BILIRUBINUR Negative 07/03/2020 1415   KETONESUR 5 (A) 11/26/2020 1805   PROTEINUR 100 (A) 11/26/2020 1805   NITRITE NEGATIVE 11/26/2020 1805   LEUKOCYTESUR SMALL (A) 11/26/2020 1805      Imaging Results:    CT HEAD WO CONTRAST  Result Date: 11/26/2020 CLINICAL DATA:  Focal neural deficit greater than 6 hours, stroke suspected. Responsive, intubated. History of prostate cancer. EXAM: CT HEAD WITHOUT CONTRAST TECHNIQUE: Contiguous axial images were obtained from the base of the skull through the vertex without intravenous contrast. COMPARISON:  CT head 05/25/2020, MRI head 05/25/2019 FINDINGS: Brain: Cerebral ventricle sizes are concordant with the degree of cerebral volume loss. Patchy and confluent areas of decreased attenuation are noted throughout the deep and periventricular white matter of the cerebral hemispheres bilaterally, compatible with chronic microvascular ischemic disease. No evidence of large-territorial acute infarction. No parenchymal hemorrhage. No mass lesion. No extra-axial collection. No mass effect or midline shift. No hydrocephalus. Basilar cisterns are patent. Vascular: No hyperdense vessel. Atherosclerotic calcifications are present within the cavernous internal carotid arteries. 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 Skull: No acute  fracture or focal lesion. Sinuses/Orbits: Mucosal thickening of the left sphenoid sinus. Mucosal thickening of bilateral maxillary sinuses. Paranasal sinuses and mastoid air cells are clear. The orbits are unremarkable. Other: Partially visualized endotracheal tube. Question coiling of enteric tube within the mouth and neck on scout view. IMPRESSION: 1. No acute intracranial abnormality. 2. Question coiling of enteric tube within the mouth and neck on scout view. Electronically Signed   By: Iven Finn M.D.   On: 11/26/2020 19:07   DG Chest Portable 1 View  Result Date: 11/26/2020 CLINICAL DATA:  Intubated EXAM: PORTABLE CHEST 1 VIEW COMPARISON:  06/05/2020 FINDINGS: Magdalene Molly is poorly visible. Esophageal tube tip appears to be near or at the carina. Additional tubing tip at the superior margin of clavicles, possible esophageal tube. Cardiomegaly with vascular congestion. Patchy atelectasis or minimal infiltrate left base. Aortic atherosclerosis. No pneumothorax. IMPRESSION: 1. Cardiomegaly with vascular congestion. Patchy atelectasis or minimal infiltrate at the left base. 2. Poorly visible carina, suspect that endotracheal tube is at or near the carina 3. Additional tubing tip at superior margin of clavicles, question esophageal tube; if there is esophageal tube placement, this should be repositioned/advanced. Electronically Signed   By: Donavan Foil M.D.   On: 11/26/2020  18:39    My personal review of EKG: Rhythm sinus tachycardia with a rate of 108, QTc 499,no Acute ST changes   Assessment & Plan:    Active Problems:   Acute lower UTI   Hyponatremia   Hypotension   Respiratory failure with hypoxia and hypercapnia (HCC)   Lactic acidosis   Hypochloremia   Community acquired pneumonia   Acute metabolic encephalopathy   1. Acute hypoxic and hypercapnic respiratory failure 1. Most likely secondary to altered mental status compromising the airway 2. There is a minimal developing infiltrate  on chest x-ray, should not be enough to cause this degree of respiratory failure 3. Covid negative 4. Currently patient is on mechanical ventilator, wean off as tolerated 5. Repeat chest x-ray in the a.m. 6. Repeat ABG in the a.m. 7. Initially ABG showed hypercapnia, repeat showed normalized PCO2 8. Monitor in ICU 2. Acute metabolic encephalopathy 1. Multifactorial, with infection, intoxication, and hypoxia all contributing 2. No witnessed seizure activity, but seizure is on the differential with the jump in lactic acid, continue to monitor for any seizure-like activity 3. CT head showed nothing acute 4. UDS negative 5. Alcohol level 365 6. TSH in the a.m. 7. Patient does have UTI and possible pneumonia  -treatments as below 8. Continue to monitor 3. EtOH abuse 1. Alcohol level 365 2. Versed given in ED 3. Precedex drip for sedation 4. When patient comes off of Precedex drip consider adding CIWA protocol 4. Hypotension 1. Blood pressures as low as systolic blood pressure is 80/60 2. Most likely secondary to propofol 3. Propofol discontinued and Precedex started, blood pressure is normalized 4. Continue to monitor closely 5. Holding p.o. blood pressure medications from home 5. UTI 1. UA indicative of UTI 2. Urine culture and blood culture pending 3. Continue Rocephin 6. Community-acquired pneumonia 1. Minimal developing infiltrate on chest x-ray 2. Continue Rocephin, add Zithromax 3. Sputum culture 4. Repeat chest x-ray in the a.m. 7. Lactic acidosis 1. Initial lactic acid 2.6, repeat 10.6 2. Has history is very limited, adding CTA abdomen to rule out ischemic changes in the abdomen or pelvis 3. Monitor for seizure activity 4. Continue to trend lactic acid 5. Continue fluids 8. Hyponatremia/hypochloremia 1. Secondary to alcohol abuse? 2. Secondary to volume down status? 3. 1.5 L NaCl bolus in ED 4. Trend in a.m.   DVT Prophylaxis-Heparin- SCDs  AM Labs Ordered, also  please review Full Orders  Family Communication: No family at bedside code Status: Full  Admission status:Inpatient :The appropriate admission status for this patient is INPATIENT. Inpatient status is judged to be reasonable and necessary in order to provide the required intensity of service to ensure the patient's safety. The patient's presenting symptoms, physical exam findings, and initial radiographic and laboratory data in the context of their chronic comorbidities is felt to place them at high risk for further clinical deterioration. Furthermore, it is not anticipated that the patient will be medically stable for discharge from the hospital within 2 midnights of admission. The following factors support the admission status of inpatient.     The patient's presenting symptoms include altered mental status, respiratory failure The worrisome physical exam findings include tachypnea, tachycardia, hypotension, GCS 3 The initial radiographic and laboratory data are worrisome because of possible pneumonia, UTI, lactic acidosis. The chronic co-morbidities include COPD, sleep apnea       * I certify that at the point of admission it is my clinical judgment that the patient will require inpatient hospital care  spanning beyond 2 midnights from the point of admission due to high intensity of service, high risk for further deterioration and high frequency of surveillance required.*  Time spent in minutes :[place]

## 2020-11-27 NOTE — Progress Notes (Signed)
Asked to consult on patient for ventilator management.  He required intubation in setting of altered mental status from acute alcohol intoxication.  He has since been extubated.  Will defer consultation.  Staff reports that patient has not been compliant with CPAP due to concern about risk of carcinogen exposure.  Explained that this is only an issue with [employer] CPAP devices, and his family should check which type of CPAP machine he has at home.  Chesley Mires, MD [place] Pager - [phone] 11/27/2020, 2:31 PM

## 2020-11-28 ENCOUNTER — Other Ambulatory Visit: Payer: Self-pay

## 2020-11-28 DIAGNOSIS — E872 Acidosis: Secondary | ICD-10-CM

## 2020-11-28 DIAGNOSIS — J189 Pneumonia, unspecified organism: Secondary | ICD-10-CM

## 2020-11-28 DIAGNOSIS — Z01818 Encounter for other preprocedural examination: Secondary | ICD-10-CM

## 2020-11-28 DIAGNOSIS — E871 Hypo-osmolality and hyponatremia: Secondary | ICD-10-CM

## 2020-11-28 LAB — COMPREHENSIVE METABOLIC PANEL
ALT: 9 U/L (ref 0–44)
AST: 19 U/L (ref 15–41)
Albumin: 2.5 g/dL — ABNORMAL LOW (ref 3.5–5.0)
Alkaline Phosphatase: 72 U/L (ref 38–126)
Anion gap: 7 (ref 5–15)
BUN: 5 mg/dL — ABNORMAL LOW (ref 8–23)
CO2: 27 mmol/L (ref 22–32)
Calcium: 8.1 mg/dL — ABNORMAL LOW (ref 8.9–10.3)
Chloride: 99 mmol/L (ref 98–111)
Creatinine, Ser: 0.54 mg/dL — ABNORMAL LOW (ref 0.61–1.24)
GFR, Estimated: >60 mL/min (ref >60)
Glucose, Bld: 139 mg/dL — ABNORMAL HIGH (ref 70–99)
Potassium: 3.3 mmol/L — ABNORMAL LOW (ref 3.5–5.1)
Sodium: 133 mmol/L — ABNORMAL LOW (ref 135–145)
Total Bilirubin: 0.9 mg/dL (ref 0.3–1.2)
Total Protein: 5.6 g/dL — ABNORMAL LOW (ref 6.5–8.1)

## 2020-11-28 LAB — CBC
HCT: 47.1 % (ref 39.0–52.0)
Hemoglobin: 15.8 g/dL (ref 13.0–17.0)
MCH: 33.8 pg (ref 26.0–34.0)
MCHC: 33.5 g/dL (ref 30.0–36.0)
MCV: 100.9 fL — ABNORMAL HIGH (ref 80.0–100.0)
Platelets: 142 10*3/uL — ABNORMAL LOW (ref 150–400)
RBC: 4.67 MIL/uL (ref 4.22–5.81)
RDW: 16 % — ABNORMAL HIGH (ref 11.5–15.5)
WBC: 6.4 10*3/uL (ref 4.0–10.5)
nRBC: 0 % (ref 0.0–0.2)

## 2020-11-28 LAB — URINE CULTURE

## 2020-11-28 LAB — PHOSPHORUS: Phosphorus: 1.5 mg/dL — ABNORMAL LOW (ref 2.5–4.6)

## 2020-11-28 LAB — GLUCOSE, CAPILLARY: Glucose-Capillary: 83 mg/dL (ref 70–99)

## 2020-11-28 LAB — MAGNESIUM: Magnesium: 1.4 mg/dL — ABNORMAL LOW (ref 1.7–2.4)

## 2020-11-28 MED ORDER — FOLIC ACID 1 MG PO TABS
1.0000 mg | ORAL_TABLET | Freq: Every day | ORAL | Status: DC
  Administered 2020-11-29 – 2020-12-03 (×5): 1 mg via ORAL
  Filled 2020-11-28 (×5): qty 1

## 2020-11-28 MED ORDER — MORPHINE SULFATE (PF) 2 MG/ML IV SOLN: 2.0000 mg | INTRAVENOUS | Status: DC | PRN

## 2020-11-28 MED ORDER — THIAMINE HCL 100 MG/ML IJ SOLN
100.0000 mg | Freq: Every day | INTRAMUSCULAR | Status: DC
  Filled 2020-11-28: qty 2

## 2020-11-28 MED ORDER — ADULT MULTIVITAMIN W/MINERALS CH
1.0000 | ORAL_TABLET | Freq: Every day | ORAL | Status: DC
  Administered 2020-11-29 – 2020-12-03 (×5): 1 via ORAL
  Filled 2020-11-28 (×5): qty 1

## 2020-11-28 MED ORDER — LORAZEPAM 2 MG/ML IJ SOLN: 1.0000 mg | INTRAMUSCULAR | Status: AC | PRN

## 2020-11-28 MED ORDER — LORAZEPAM 1 MG PO TABS: 1.0000 mg | ORAL_TABLET | ORAL | Status: AC | PRN

## 2020-11-28 MED ORDER — FLUTICASONE FUROATE-VILANTEROL 100-25 MCG/INH IN AEPB
1.0000 | INHALATION_SPRAY | Freq: Every day | RESPIRATORY_TRACT | Status: DC
  Administered 2020-11-29 – 2020-12-03 (×5): 1 via RESPIRATORY_TRACT
  Filled 2020-11-28: qty 28

## 2020-11-28 MED ORDER — LOPERAMIDE HCL 2 MG PO CAPS
2.0000 mg | ORAL_CAPSULE | ORAL | Status: DC | PRN
  Administered 2020-11-28 – 2020-11-29 (×5): 2 mg via ORAL
  Filled 2020-11-28 (×5): qty 1

## 2020-11-28 MED ORDER — THIAMINE HCL 100 MG PO TABS
100.0000 mg | ORAL_TABLET | Freq: Every day | ORAL | Status: DC
  Administered 2020-11-29 – 2020-12-03 (×5): 100 mg via ORAL
  Filled 2020-11-28 (×5): qty 1

## 2020-11-28 NOTE — Evaluation (Signed)
Physical Therapy Evaluation Patient Details Name: Steven Howard MRN: [id number] DOB: 1950-12-12 Today's Date: 11/28/2020   History of Present Illness  Steven Howard  is a 70 y.o. male, with history of sleep apnea, glaucoma, COPD, cancer, presents the ED with a chief complaint of "breathing issues."  History is unclear, but this shortness of breath seems to be the reason that EMS was called out to the home.  When EMS arrived there is reported that he said he was not having any trouble breathing he was just drinking.  Family members had reported that his pulse ox was low, but was able to convince him to be transported by EMS to the hospital.  Patient was initially at his baseline and then had an abrupt change in mental status while with EMS.  Lection saturations dropped and they put him on a nonrebreather, patient became unresponsive.  When he arrived to the ED his oxygen saturations were in the 80s on a nonrebreather, patient had a GCS of 3.  Patient was intubated to protect his airway.  No further history could be obtained.    Clinical Impression  Patient demonstrates slow labored movement for sitting up at bedside with c/o severe dizziness upon sitting that resolved after 3-4 minutes, very unsteady on feet requiring use of RW to maintain standing balance, limited to a few steps in room with SpO2 dropping from 95% to 80% while on room air and put back on 2 LPM.  Patient tolerated sitting up in chair after therapy - RN notified.  Patient will benefit from continued physical therapy in hospital and recommended venue below to increase strength, balance, endurance for safe ADLs and gait.      Follow Up Recommendations SNF;Supervision for mobility/OOB;Supervision - Intermittent    Equipment Recommendations  None recommended by PT    Recommendations for Other Services       Precautions / Restrictions Precautions Precautions: Fall Restrictions Weight Bearing Restrictions: No      Mobility  Bed  Mobility Overal bed mobility: Needs Assistance Bed Mobility: Supine to Sit     Supine to sit: Min assist     General bed mobility comments: increased time, labored movement    Transfers Overall transfer level: Needs assistance Equipment used: Rolling walker (2 wheeled) Transfers: Sit to/from [facility] Sit to Stand: Min assist Stand pivot transfers: Min assist       General transfer comment: unsteady slow labored movement  Ambulation/Gait Ambulation/Gait assistance: Min assist;Mod assist Gait Distance (Feet): 12 Feet Assistive device: Rolling walker (2 wheeled) Gait Pattern/deviations: Decreased step length - right;Decreased step length - left;Decreased stride length;Wide base of support Gait velocity: decreased   General Gait Details: limited to ambulation in room with slow labored unsteady cadence requiring wide base of support and leaning over RW to maintain balance, limited secondary to fatigue and SpO2 dropping from 95% to 80% on room air  Stairs            Wheelchair Mobility    Modified Rankin (Stroke Patients Only)       Balance Overall balance assessment: Needs assistance Sitting-balance support: Feet supported;No upper extremity supported Sitting balance-Leahy Scale: Fair Sitting balance - Comments: fair/good seated at EOB   Standing balance support: During functional activity;Bilateral upper extremity supported Standing balance-Leahy Scale: Poor Standing balance comment: fair/poor using RW  Pertinent Vitals/Pain Pain Assessment: No/denies pain    Home Living Family/patient expects to be discharged to:: Private residence Living Arrangements: Alone Available Help at Discharge: Family;Available PRN/intermittently Type of Home: House Home Access: Ramped entrance     Home Layout: One level Home Equipment: Crutches;Cane - single point;Grab bars - toilet;Grab bars - tub/shower;Walker - 2  wheels;Shower seat      Prior Function Level of Independence: Independent with assistive device(s)         Comments: [employer], drives     Hand Dominance        Extremity/Trunk Assessment   Upper Extremity Assessment Upper Extremity Assessment: Generalized weakness    Lower Extremity Assessment Lower Extremity Assessment: Generalized weakness    Cervical / Trunk Assessment Cervical / Trunk Assessment: Normal  Communication   Communication: No difficulties  Cognition Arousal/Alertness: Awake/alert Behavior During Therapy: WFL for tasks assessed/performed Overall Cognitive Status: Within Functional Limits for tasks assessed                                        General Comments      Exercises     Assessment/Plan    PT Assessment Patient needs continued PT services  PT Problem List Decreased strength;Decreased activity tolerance;Decreased balance;Decreased mobility       PT Treatment Interventions DME instruction;Gait training;Stair training;Functional mobility training;Therapeutic activities;Therapeutic exercise;Patient/family education;Balance training    PT Goals (Current goals can be found in the Care Plan section)  Acute Rehab PT Goals Patient Stated Goal: return home with family to assist PT Goal Formulation: With patient Time For Goal Achievement: 12/12/20 Potential to Achieve Goals: Good    Frequency Min 3X/week   Barriers to discharge        Co-evaluation               AM-PAC PT "6 Clicks" Mobility  Outcome Measure Help needed turning from your back to your side while in a flat bed without using bedrails?: A Little Help needed moving from lying on your back to sitting on the side of a flat bed without using bedrails?: A Little Help needed moving to and from a bed to a chair (including a wheelchair)?: A Little Help needed standing up from a chair using your arms (e.g., wheelchair or bedside chair)?:  A Little Help needed to walk in hospital room?: A Lot Help needed climbing 3-5 steps with a railing? : A Lot 6 Click Score: 16    End of Session Equipment Utilized During Treatment: Oxygen Activity Tolerance: Patient tolerated treatment well;Patient limited by fatigue Patient left: in chair;with call bell/phone within reach;with chair alarm set Nurse Communication: Mobility status PT Visit Diagnosis: Unsteadiness on feet (R26.81);Other abnormalities of gait and mobility (R26.89);Muscle weakness (generalized) (M62.81)    Time: 3662-9476 PT Time Calculation (min) (ACUTE ONLY): 34 min   Charges:   PT Evaluation $PT Eval Moderate Complexity: 1 Mod PT Treatments $Therapeutic Activity: 23-37 mins        11:37 AM, 11/28/20 Lonell Grandchild, MPT Physical Therapist with [facility] [phone] office [phone] mobile phone

## 2020-11-28 NOTE — Plan of Care (Signed)
  Problem: Acute Rehab PT Goals(only PT should resolve) Goal: Pt Will Go Supine/Side To Sit Outcome: Progressing Flowsheets (Taken 11/28/2020 1138) Pt will go Supine/Side to Sit: with min guard assist Goal: Patient Will Transfer Sit To/From Stand Outcome: Progressing Flowsheets (Taken 11/28/2020 1138) Patient will transfer sit to/from stand: with min guard assist Goal: Pt Will Transfer Bed To Chair/Chair To Bed Outcome: Progressing Flowsheets (Taken 11/28/2020 1138) Pt will Transfer Bed to Chair/Chair to Bed: min guard assist Goal: Pt Will Ambulate Outcome: Progressing Flowsheets (Taken 11/28/2020 1138) Pt will Ambulate:  50 feet  with min guard assist  with rolling walker  with cane   11:39 AM, 11/28/20 Lonell Grandchild, MPT Physical Therapist with [facility] [phone] office [phone] mobile phone

## 2020-11-28 NOTE — Progress Notes (Signed)
PROGRESS NOTE    Steven Howard  ZOX:[id number] DOB: 13-Sep-1950 DOA: 11/26/2020 PCP: Claretta Fraise, MD  No chief complaint on file.   Brief admission narrative:  As per H&P written by Dr. Clearence Ped on 11/27/20 Steven Howard  is a 70 y.o. male, with history of sleep apnea, glaucoma, COPD, cancer, presents the ED with a chief complaint of "breathing issues."  History is unclear, but this shortness of breath seems to be the reason that EMS was called out to the home.  When EMS arrived there is reported that he said he was not having any trouble breathing he was just drinking.  Family members had reported that his pulse ox was low, but was able to convince him to be transported by EMS to the hospital.  Patient was initially at his baseline and then had an abrupt change in mental status while with EMS.  Lection saturations dropped and they put him on a nonrebreather, patient became unresponsive.  When he arrived to the ED his oxygen saturations were in the 80s on a nonrebreather, patient had a GCS of 3.  Patient was intubated to protect his airway.  No further history could be obtained.  In the ED Temp 96.7, heart rate 71-119 at time of admission heart rate is 72, blood pressure as low as systolic 81X over 91Y this is likely secondary to propofol as blood pressure normalized when propofol was switched to Precedex, respiratory rate 15-24, O2 saturations 84% to 100% Chemistry panel shows hyponatremia at 132, hypochloremia at 90 Patient was given 1 L of normal saline, 1 L of LR, 500 of normal saline Patient was given fentanyl, Versed, propofol, succinylcholine for intubation sedating purposes After patient was intubated he became more responsive and was trying to pull out his tubes who is put in nonviolent restraints.  Lactic acid was initially elevated at 2.6, and then repeat 10.6 Alcohol level was 365 UDS negative Initial ABG showed a pH of 7.3 and a hypercapnia at 57 Repeat ABG showed pH of  7.45, PCO2 of 39 Admission requested for further work-up of altered mental status and respiratory failure  Assessment & Plan: 1-acute hypoxic and hypercapnic respiratory failure -In the setting of alcohol intoxication and lack of CPAP usage. -Extubated and with good oxygen saturation on 2 L nasal cannula supplementation -Continue CPAP nightly -Resume home inhalers/bronchodilators -Starting flutter valve.  2-history of COPD -Currently no wheezing -Continue as needed DuoNeb and the use of Breo Ellipta.  3-alcohol abuse/intoxication/withdrawal -Currently off Precedex -Continue CIWA protocol and as needed Ativan -Continue thiamine and folic acid -Cessation counseling provided.  4-left lower lobe pneumonia -Continue Rocephin/Zithromax -As mentioned above, continue flutter valve and mucolytic's.  5-dehydration/hyponatremia/hypochloremia -In the setting of poor oral intake and alcohol abuse -Continue to maintain adequate hydration -Now tolerating diet. -Follow electrolytes trend.  6-acute metabolic encephalopathy in the setting of hypercapnia and alcohol intoxication/withdrawal -Improved; oriented x3 currently -Continue to follow response.  7-physical deconditioning -Patient seen by physical therapy with recommendation for a skilled nursing facility short-term rehab -Patient in agreement -TOC aware.  DVT prophylaxis: Heparin Code Status: Full code. Family Communication: No family at bedside. Disposition:   Status is: Inpatient  Dispo: The patient is from: home              Anticipated d/c is to: SNF              Patient currently no medically stable for discharge; feeling weak, deconditioned and admits to poor balance.  Physical therapy  recommending skilled nursing facility.  No fever, improved air movement and only requiring 2 L nasal cannula supplementation.  Patient will be transferred to telemetry bed, continue CIWA protocol follow clinical response.  Continue  antibiotics for  Left Lower lobe pneumonia.   Difficult to place patient no       Consultants:   Pulmonology initially curbside when patient presented in need of transient intubation.   Procedures:  See below for x-ray report.  Antimicrobials:  Rocephin/Zithromax  Subjective: Following commands appropriately; no chest pain, no nausea, no vomiting.  Tolerating diet.  No frank withdrawal reported.  Off Precedex.  Requiring 2 L nasal cannula supplementation.  Feeling weak and deconditioned.  Objective: Vitals:   11/28/20 1300 11/28/20 1400 11/28/20 1500 11/28/20 1538  BP: (!) 124/59 (!) 146/86 (!) 165/92   Pulse: 92 88 88   Resp: 15 18 17    Temp:    98.1 F (36.7 C)  TempSrc:    Oral  SpO2: 99% 97% 96%   Weight:      Height:        Intake/Output Summary (Last 24 hours) at 11/28/2020 1744 Last data filed at 11/28/2020 1551 Gross per 24 hour  Intake 2653.08 ml  Output 1875 ml  Net 778.08 ml   Filed Weights   11/26/20 1743  Weight: 90.7 kg    Examination:  General exam: Appears calm and comfortable; oriented x3, following commands, chest pain, no nausea, no vomiting.  Currently tolerating diet.  Afebrile Respiratory system: Positive rhonchi bilaterally; no wheezing.  2 L nasal cannula supplementation in place. Cardiovascular system: S1 & S2 heard, RRR. No JVD, murmurs, rubs, gallops or clicks. No pedal edema. Gastrointestinal system: Abdomen is nondistended, soft and nontender. No organomegaly or masses felt. Normal bowel sounds heard. Central nervous system: Alert and oriented. No focal neurological deficits. Extremities: No cyanosis or clubbing. Skin: No petechiae. Psychiatry:  Mood & affect appropriate.     Data Reviewed: I have personally reviewed following labs and imaging studies  CBC: Recent Labs  Lab 11/26/20 1740 11/27/20 0616  WBC 9.3 9.1  NEUTROABS 3.6 5.9  HGB 17.5* 17.1*  HCT 51.6 50.5  MCV 100.8* 100.6*  PLT 189 139*    Basic Metabolic  Panel: Recent Labs  Lab 11/26/20 1740 11/27/20 0616  NA 132* 136  K 4.1 4.0  CL 90* 98  CO2 30 24  GLUCOSE 108* 67*  BUN 13 9  CREATININE 0.64 0.52*  CALCIUM 8.3* 7.9*  MG  --  1.5*  PHOS  --  1.9*    GFR: Estimated Creatinine Clearance: 93 mL/min (A) (by C-G formula based on SCr of 0.52 mg/dL (L)).  Liver Function Tests: Recent Labs  Lab 11/26/20 1740 11/27/20 0616  AST 26 21  ALT 13 12  ALKPHOS 88 81  BILITOT 0.3 0.9  PROT 6.7 5.9*  ALBUMIN 3.3* 2.7*    CBG: Recent Labs  Lab 11/26/20 1742 11/27/20 0739 11/27/20 1107 11/27/20 1936 11/28/20 1540  GLUCAP 135* 75 87 213* 83     Recent Results (from the past 240 hour(s))  Resp Panel by RT-PCR (Flu A&B, Covid) Nasopharyngeal Swab     Status: None   Collection Time: 11/26/20  5:56 PM   Specimen: Nasopharyngeal Swab; Nasopharyngeal(NP) swabs in vial transport medium  Result Value Ref Range Status   SARS Coronavirus 2 by RT PCR NEGATIVE NEGATIVE Final    Comment: (NOTE) SARS-CoV-2 target nucleic acids are NOT DETECTED.  The SARS-CoV-2 RNA is generally detectable  in upper respiratory specimens during the acute phase of infection. The lowest concentration of SARS-CoV-2 viral copies this assay can detect is 138 copies/mL. A negative result does not preclude SARS-Cov-2 infection and should not be used as the sole basis for treatment or other patient management decisions. A negative result may occur with  improper specimen collection/handling, submission of specimen other than nasopharyngeal swab, presence of viral mutation(s) within the areas targeted by this assay, and inadequate number of viral copies(<138 copies/mL). A negative result must be combined with clinical observations, patient history, and epidemiological information. The expected result is Negative.  Fact Sheet for Patients:  EntrepreneurPulse.com.au  Fact Sheet for Healthcare Providers:   IncredibleEmployment.be  This test is no t yet approved or cleared by the [place] FDA and  has been authorized for detection and/or diagnosis of SARS-CoV-2 by FDA under an Emergency Use Authorization (EUA). This EUA will remain  in effect (meaning this test can be used) for the duration of the COVID-19 declaration under Section 564(b)(1) of the Act, 21 U.S.C.section 360bbb-3(b)(1), unless the authorization is terminated  or revoked sooner.       Influenza A by PCR NEGATIVE NEGATIVE Final   Influenza B by PCR NEGATIVE NEGATIVE Final    Comment: (NOTE) The Xpert Xpress SARS-CoV-2/FLU/RSV plus assay is intended as an aid in the diagnosis of influenza from Nasopharyngeal swab specimens and should not be used as a sole basis for treatment. Nasal washings and aspirates are unacceptable for Xpert Xpress SARS-CoV-2/FLU/RSV testing.  Fact Sheet for Patients: EntrepreneurPulse.com.au  Fact Sheet for Healthcare Providers: IncredibleEmployment.be  This test is not yet approved or cleared by the [place] FDA and has been authorized for detection and/or diagnosis of SARS-CoV-2 by FDA under an Emergency Use Authorization (EUA). This EUA will remain in effect (meaning this test can be used) for the duration of the COVID-19 declaration under Section 564(b)(1) of the Act, 21 U.S.C. section 360bbb-3(b)(1), unless the authorization is terminated or revoked.  Performed at [facility], [address], [place], [place] [address]   Urine Culture     Status: Abnormal   Collection Time: 11/26/20  7:41 PM   Specimen: Urine, Catheterized  Result Value Ref Range Status   Specimen Description   Final    Urine Performed at [facility], [address], [place], [place] [address]    Special Requests   Final    NONE Performed at [facility], [address], [place], [place] [address]    Culture MULTIPLE SPECIES PRESENT, SUGGEST  RECOLLECTION (A)  Final   Report Status 11/28/2020 FINAL  Final  Blood culture (routine x 2)     Status: None (Preliminary result)   Collection Time: 11/26/20  7:41 PM   Specimen: BLOOD  Result Value Ref Range Status   Specimen Description BLOOD RIGHT ANTECUBITAL  Final   Special Requests   Final    BOTTLES DRAWN AEROBIC AND ANAEROBIC Blood Culture adequate volume   Culture   Final    NO GROWTH 2 DAYS Performed at [facility], [address] [place], [place] [address]    Report Status PENDING  Incomplete  Blood culture (routine x 2)     Status: None (Preliminary result)   Collection Time: 11/26/20  8:38 PM   Specimen: Left Antecubital; Blood  Result Value Ref Range Status   Specimen Description LEFT ANTECUBITAL  Final   Special Requests   Final    BOTTLES DRAWN AEROBIC AND ANAEROBIC Blood Culture adequate volume  Culture   Final    NO GROWTH 2 DAYS Performed at [facility], [address], [place], [place] [address]    Report Status PENDING  Incomplete  MRSA PCR Screening     Status: None   Collection Time: 11/27/20 12:04 AM   Specimen: Nasal Mucosa; Nasopharyngeal  Result Value Ref Range Status   MRSA by PCR NEGATIVE NEGATIVE Final    Comment:        The GeneXpert MRSA Assay (FDA approved for NASAL specimens only), is one component of a comprehensive MRSA colonization surveillance program. It is not intended to diagnose MRSA infection nor to guide or monitor treatment for MRSA infections. Performed at [facility], [address], [place], [place] [address]   Culture, Respiratory w Gram Stain     Status: None (Preliminary result)   Collection Time: 11/27/20  3:11 AM   Specimen: Tracheal Aspirate  Result Value Ref Range Status   Specimen Description   Final    TRACHEAL ASPIRATE Performed at [facility], [address], [place], [place] [address]    Special Requests   Final    NONE Performed at [facility], [address] [place], [place] [address]    Gram  Stain   Final    ABUNDANT WBC PRESENT,BOTH PMN AND MONONUCLEAR FEW GRAM POSITIVE COCCI    Culture   Final    FEW Normal respiratory flora-no Staph aureus or Pseudomonas seen Performed at [facility] [address] [place], [place] [address]    Report Status PENDING  Incomplete     Radiology Studies: DG Abdomen 1 View  Result Date: 11/27/2020 CLINICAL DATA:  Check gastric catheter placement EXAM: ABDOMEN - 1 VIEW COMPARISON:  None. FINDINGS: Gastric catheter is noted within the stomach. Scattered large and small bowel gas is noted. IMPRESSION: Gastric catheter in the stomach. Electronically Signed   By: Inez Catalina M.D.   On: 11/27/2020 03:35   CT HEAD WO CONTRAST  Result Date: 11/26/2020 CLINICAL DATA:  Focal neural deficit greater than 6 hours, stroke suspected. Responsive, intubated. History of prostate cancer. EXAM: CT HEAD WITHOUT CONTRAST TECHNIQUE: Contiguous axial images were obtained from the base of the skull through the vertex without intravenous contrast. COMPARISON:  CT head 05/25/2020, MRI head 05/25/2019 FINDINGS: Brain: Cerebral ventricle sizes are concordant with the degree of cerebral volume loss. Patchy and confluent areas of decreased attenuation are noted throughout the deep and periventricular white matter of the cerebral hemispheres bilaterally, compatible with chronic microvascular ischemic disease. No evidence of large-territorial acute infarction. No parenchymal hemorrhage. No mass lesion. No extra-axial collection. No mass effect or midline shift. No hydrocephalus. Basilar cisterns are patent. Vascular: No hyperdense vessel. Atherosclerotic calcifications are present within the cavernous internal carotid arteries. 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 Skull: No acute fracture or focal lesion. Sinuses/Orbits: Mucosal thickening of the left  sphenoid sinus. Mucosal thickening of bilateral maxillary sinuses. Paranasal sinuses and mastoid air cells are clear. The orbits are unremarkable. Other: Partially visualized endotracheal tube. Question coiling of enteric tube within the mouth and neck on scout view. IMPRESSION: 1. No acute intracranial abnormality. 2. Question coiling of enteric tube within the mouth and neck on scout view. Electronically Signed   By: Iven Finn M.D.   On: 11/26/2020 19:07   DG CHEST PORT 1 VIEW  Result Date: 11/27/2020 CLINICAL DATA:  Respiratory failure EXAM: PORTABLE CHEST  1 VIEW COMPARISON:  11/26/2020 FINDINGS: Nasogastric tube is been advanced into the upper abdomen beyond the margin of the examination. Endotracheal tube tip is seen 2.2 cm above the carina. Mild diffuse thickening of the pulmonary interstitium is again seen. No confluent pulmonary infiltrate. No pneumothorax or pleural effusion. Cardiac size within normal limits. Pulmonary vascularity is normal. No acute bone abnormality. IMPRESSION: Support tubes in appropriate position. No focal pulmonary infiltrate. Chronic pulmonary interstitial changes. Electronically Signed   By: Fidela Salisbury MD   On: 11/27/2020 03:37   DG Chest Portable 1 View  Result Date: 11/26/2020 CLINICAL DATA:  Intubated EXAM: PORTABLE CHEST 1 VIEW COMPARISON:  06/05/2020 FINDINGS: Magdalene Molly is poorly visible. Esophageal tube tip appears to be near or at the carina. Additional tubing tip at the superior margin of clavicles, possible esophageal tube. Cardiomegaly with vascular congestion. Patchy atelectasis or minimal infiltrate left base. Aortic atherosclerosis. No pneumothorax. IMPRESSION: 1. Cardiomegaly with vascular congestion. Patchy atelectasis or minimal infiltrate at the left base. 2. Poorly visible carina, suspect that endotracheal tube is at or near the carina 3. Additional tubing tip at superior margin of clavicles, question esophageal tube; if there is esophageal tube  placement, this should be repositioned/advanced. Electronically Signed   By: Donavan Foil M.D.   On: 11/26/2020 18:39   CT Angio Abd/Pel w/ and/or w/o  Result Date: 11/27/2020 CLINICAL DATA:  Acute mesenteric ischemia, lactic acidosis EXAM: CTA ABDOMEN AND PELVIS WITHOUT AND WITH CONTRAST TECHNIQUE: Multidetector CT imaging of the abdomen and pelvis was performed using the standard protocol during bolus administration of intravenous contrast. Multiplanar reconstructed images and MIPs were obtained and reviewed to evaluate the vascular anatomy. CONTRAST:  116mL OMNIPAQUE IOHEXOL 300 MG/ML  SOLN COMPARISON:  None. FINDINGS: VASCULAR Aorta: Normal caliber. No aneurysm or dissection. Extensive aortic atherosclerotic calcification. No periaortic inflammatory change identified. Celiac: Widely patent. No aneurysm or dissection. Classic anatomic configuration. SMA: Widely patent.  No aneurysm or dissection. Renals: Single renal arteries bilaterally. Less than 50% stenosis of the right renal artery at its origin. Wide patency of the left renal artery. Normal vascular morphology. No aneurysm. IMA: Widely patent. Inflow: Extensive atherosclerotic calcification. No evidence of hemodynamically significant stenosis. No aneurysm or dissection. Internal iliac arteries are patent bilaterally. Proximal Outflow: Extensive atherosclerotic calcification results in 50% stenosis of the left common femoral artery. Extensive calcification noted within the visualized superficial femoral arteries bilaterally. Veins: Unremarkable Review of the MIP images confirms the above findings. NON-VASCULAR Lower chest: Tiny left pleural effusion. Mild bibasilar atelectasis. Moderate right coronary artery calcification. Global cardiac size is within normal limits. Hepatobiliary: Mild hepatic steatosis. No enhancing liver lesion. Gallbladder unremarkable. No intra or extrahepatic biliary ductal dilation. Pancreas: Unremarkable Spleen: Unremarkable  Adrenals/Urinary Tract: The adrenal glands are unremarkable. The kidneys are normal. Mild bilateral distal hydroureter. The bladder is circumferentially markedly thickened and there is moderate perivesicular inflammatory stranding in keeping with changes of infectious or inflammatory cystitis. The bladder is not distended. A Foley catheter balloon is seen inflated within the prostatic urethra. Stomach/Bowel: Moderate left inguinal hernia contains a single loop of unremarkable sigmoid colon. The stomach, small bowel, and large bowel are otherwise unremarkable. Appendix normal. 20 mm nodule within the a mesentery within the left lower quadrant adjacent to the descending colon likely represents a remote omental infarct. No free intraperitoneal gas or fluid. Lymphatic: No pathologic adenopathy within the abdomen and pelvis. Reproductive: Brachytherapy seeds are seen within the prostate gland. Seminal vesicles are unremarkable. There is calcification of  the vas deferens bilaterally. Other: Moderate presacral and retroperitoneal edema is present, possibly secondary to the inflammatory process within the pelvis. No discrete drainable fluid collection identified. Moderate stool within the rectum. Musculoskeletal: No lytic or blastic bone lesion. No acute bone abnormality. IMPRESSION: VASCULAR Wide patency of the mesenteric arterial and venous vasculature. No evidence of differential bowel perfusion. Extensive aortoiliac atherosclerotic calcification with less than 50% stenosis of the right renal artery origin. Extensive disease of the visualized lower extremity arterial outflow with 50% stenosis of the left common femoral artery. NON-VASCULAR Foley catheter balloon inflated within the prostatic urethra. Marked circumferential bladder wall thickening and extensive perivesicular inflammatory stranding in keeping with infectious or inflammatory cystitis. The bladder is not distended. Moderate retroperitoneal edema within the  pelvis may be reactive in nature. Moderate left inguinal hernia containing and unremarkable loop of sigmoid colon Mild hepatic steatosis Aortic Atherosclerosis (ICD10-I70.0). Electronically Signed   By: Fidela Salisbury MD   On: 11/27/2020 02:08    Scheduled Meds: . Chlorhexidine Gluconate Cloth  6 each Topical Daily  . fluticasone furoate-vilanterol  1 puff Inhalation Daily  . folic acid  1 mg Oral Daily  . heparin  5,000 Units Subcutaneous Q8H  . mouth rinse  15 mL Mouth Rinse BID  . multivitamin with minerals  1 tablet Oral Daily  . thiamine  100 mg Oral Daily   Or  . thiamine  100 mg Intravenous Daily   Continuous Infusions: . sodium chloride 100 mL/hr (11/28/20 1548)  . sodium chloride    . azithromycin 10 mL/hr at 11/28/20 1548  . cefTRIAXone (ROCEPHIN)  IV Stopped (11/27/20 2115)     LOS: 2 days    Time spent: 35 minutes  Barton Dubois, MD [facility]   To contact the attending provider between 7A-7P or the covering provider during after hours 7P-7A, please log into the web site [email or web address] and access using universal [place] password for that web site. If you do not have the password, please call the hospital operator.  11/28/2020, 5:44 PM

## 2020-11-29 LAB — CULTURE, RESPIRATORY W GRAM STAIN: Culture: NORMAL

## 2020-11-29 MED ORDER — CEFDINIR 300 MG PO CAPS
300.0000 mg | ORAL_CAPSULE | Freq: Two times a day (BID) | ORAL | Status: DC
  Administered 2020-11-29 – 2020-12-03 (×8): 300 mg via ORAL
  Filled 2020-11-29 (×8): qty 1

## 2020-11-29 NOTE — Progress Notes (Signed)
PROGRESS NOTE    Steven Howard  OQH:[id number] DOB: 08-01-1951 DOA: 11/26/2020 PCP: Claretta Fraise, MD  No chief complaint on file.   Brief admission narrative:  As per H&P written by Dr. Clearence Ped on 11/27/20 Steven Howard  is a 70 y.o. male, with history of sleep apnea, glaucoma, COPD, cancer, presents the ED with a chief complaint of "breathing issues."  History is unclear, but this shortness of breath seems to be the reason that EMS was called out to the home.  When EMS arrived there is reported that he said he was not having any trouble breathing he was just drinking.  Family members had reported that his pulse ox was low, but was able to convince him to be transported by EMS to the hospital.  Patient was initially at his baseline and then had an abrupt change in mental status while with EMS.  Lection saturations dropped and they put him on a nonrebreather, patient became unresponsive.  When he arrived to the ED his oxygen saturations were in the 80s on a nonrebreather, patient had a GCS of 3.  Patient was intubated to protect his airway.  No further history could be obtained.  In the ED Temp 96.7, heart rate 71-119 at time of admission heart rate is 72, blood pressure as low as systolic 54S over 56C this is likely secondary to propofol as blood pressure normalized when propofol was switched to Precedex, respiratory rate 15-24, O2 saturations 84% to 100% Chemistry panel shows hyponatremia at 132, hypochloremia at 90 Patient was given 1 L of normal saline, 1 L of LR, 500 of normal saline Patient was given fentanyl, Versed, propofol, succinylcholine for intubation sedating purposes After patient was intubated he became more responsive and was trying to pull out his tubes who is put in nonviolent restraints.  Lactic acid was initially elevated at 2.6, and then repeat 10.6 Alcohol level was 365 UDS negative Initial ABG showed a pH of 7.3 and a hypercapnia at 57 Repeat ABG showed pH of  7.45, PCO2 of 39 Admission requested for further work-up of altered mental status and respiratory failure  Assessment & Plan: 1-acute hypoxic and hypercapnic respiratory failure -In the setting of alcohol intoxication and lack of CPAP usage. -Extubated and with good oxygen saturation on 2 L nasal cannula supplementation. -Continue to wean of oxygen supplementation as tolerated. -Continue CPAP nightly; education provided about importance of compliance. -Resume home inhalers/bronchodilators -Starting flutter valve.  2-history of COPD -Currently no wheezing -Continue as needed DuoNeb  -Continue the use of Breo Ellipta.  3-alcohol abuse/intoxication/withdrawal -Currently off Precedex -Continue CIWA protocol and as needed Ativan -Continue thiamine and folic acid -Cessation counseling provided.  4-left lower lobe pneumonia -Continue the use of flutter valve, wean oxygen supplementation as tolerated -Transition antibiotics to Augmentin orally -Continue supportive care.  5-dehydration/hyponatremia/hypochloremia -In the setting of poor oral intake and alcohol abuse -Continue to maintain adequate hydration -Now tolerating diet. -Continue to follow electrolytes trend.  6-acute metabolic encephalopathy in the setting of hypercapnia and alcohol intoxication/withdrawal -Improved; oriented x3 currently -Continue to follow response.  7-physical deconditioning -Patient seen by physical therapy with recommendation for a skilled nursing facility short-term rehab -Patient in agreement -TOC aware.  DVT prophylaxis: Heparin Code Status: Full code. Family Communication: No family at bedside. Disposition:   Status is: Inpatient  Dispo: The patient is from: home              Anticipated d/c is to: SNF  Patient currently is medically stable for discharge to SNF when bed available. Still feeling weak, deconditioned and admits to poor balance.  Physical therapy recommending  skilled nursing facility.  No fever, improved air movement and only requiring 2 L nasal cannula supplementation.  Patient without active withdrawal symptoms; continue CIWA protocol monitoring.  Transition antibiotics to oral route   Difficult to place patient no       Consultants:   Pulmonology initially curbside when patient presented in need of transient intubation.   Procedures:  See below for x-ray report.  Antimicrobials:  Rocephin/Zithromax discontinued on 11/29/20 Cefdinir 11/29/20  Subjective: No fever, no chest pain, no nausea, no vomiting.  Reports feeling weak and tired.  Objective: Vitals:   11/28/20 2234 11/29/20 0039 11/29/20 0445 11/29/20 0853  BP: (!) 145/85  (!) 142/99   Pulse: 94 83 97   Resp: 18 16 19    Temp: 97.8 F (36.6 C)  97.8 F (36.6 C)   TempSrc: Oral  Oral   SpO2: 97% 98% 97% 99%  Weight:      Height:        Intake/Output Summary (Last 24 hours) at 11/29/2020 1803 Last data filed at 11/29/2020 1500 Gross per 24 hour  Intake 1111 ml  Output 3700 ml  Net -2589 ml   Filed Weights   11/26/20 1743  Weight: 90.7 kg    Examination: General exam: Alert, awake, oriented x 3; reporting feeling weak, deconditioned and short winded with activity.  Expressed good urine output and reports no use of CPAP last night. Respiratory system: No wheezing, no crackles, positive rhonchi bilateral.  Good oxygen saturation on 2 L nasal cannula. Cardiovascular system:RRR. No murmurs, rubs, gallops.  No JVD. Gastrointestinal system: Abdomen is nondistended, soft and nontender. No organomegaly or masses felt. Normal bowel sounds heard. Central nervous system: Alert and oriented. No focal neurological deficits. Extremities: No cyanosis or clubbing. Skin: No petechiae. Psychiatry: Judgement and insight appear normal. Mood & affect appropriate.    Data Reviewed: I have personally reviewed following labs and imaging studies  CBC: Recent Labs  Lab 11/26/20 1740  11/27/20 0616 11/28/20 1903  WBC 9.3 9.1 6.4  NEUTROABS 3.6 5.9  --   HGB 17.5* 17.1* 15.8  HCT 51.6 50.5 47.1  MCV 100.8* 100.6* 100.9*  PLT 189 139* 142*    Basic Metabolic Panel: Recent Labs  Lab 11/26/20 1740 11/27/20 0616 11/28/20 1903  NA 132* 136 133*  K 4.1 4.0 3.3*  CL 90* 98 99  CO2 30 24 27   GLUCOSE 108* 67* 139*  BUN 13 9 5*  CREATININE 0.64 0.52* 0.54*  CALCIUM 8.3* 7.9* 8.1*  MG  --  1.5* 1.4*  PHOS  --  1.9* 1.5*    GFR: Estimated Creatinine Clearance: 93 mL/min (A) (by C-G formula based on SCr of 0.54 mg/dL (L)).  Liver Function Tests: Recent Labs  Lab 11/26/20 1740 11/27/20 0616 11/28/20 1903  AST 26 21 19   ALT 13 12 9   ALKPHOS 88 81 72  BILITOT 0.3 0.9 0.9  PROT 6.7 5.9* 5.6*  ALBUMIN 3.3* 2.7* 2.5*    CBG: Recent Labs  Lab 11/26/20 1742 11/27/20 0739 11/27/20 1107 11/27/20 1936 11/28/20 1540  GLUCAP 135* 75 87 213* 83     Recent Results (from the past 240 hour(s))  Resp Panel by RT-PCR (Flu A&B, Covid) Nasopharyngeal Swab     Status: None   Collection Time: 11/26/20  5:56 PM   Specimen: Nasopharyngeal Swab; Nasopharyngeal(NP) swabs  in vial transport medium  Result Value Ref Range Status   SARS Coronavirus 2 by RT PCR NEGATIVE NEGATIVE Final    Comment: (NOTE) SARS-CoV-2 target nucleic acids are NOT DETECTED.  The SARS-CoV-2 RNA is generally detectable in upper respiratory specimens during the acute phase of infection. The lowest concentration of SARS-CoV-2 viral copies this assay can detect is 138 copies/mL. A negative result does not preclude SARS-Cov-2 infection and should not be used as the sole basis for treatment or other patient management decisions. A negative result may occur with  improper specimen collection/handling, submission of specimen other than nasopharyngeal swab, presence of viral mutation(s) within the areas targeted by this assay, and inadequate number of viral copies(<138 copies/mL). A negative result  must be combined with clinical observations, patient history, and epidemiological information. The expected result is Negative.  Fact Sheet for Patients:  EntrepreneurPulse.com.au  Fact Sheet for Healthcare Providers:  IncredibleEmployment.be  This test is no t yet approved or cleared by the [place] FDA and  has been authorized for detection and/or diagnosis of SARS-CoV-2 by FDA under an Emergency Use Authorization (EUA). This EUA will remain  in effect (meaning this test can be used) for the duration of the COVID-19 declaration under Section 564(b)(1) of the Act, 21 U.S.C.section 360bbb-3(b)(1), unless the authorization is terminated  or revoked sooner.       Influenza A by PCR NEGATIVE NEGATIVE Final   Influenza B by PCR NEGATIVE NEGATIVE Final    Comment: (NOTE) The Xpert Xpress SARS-CoV-2/FLU/RSV plus assay is intended as an aid in the diagnosis of influenza from Nasopharyngeal swab specimens and should not be used as a sole basis for treatment. Nasal washings and aspirates are unacceptable for Xpert Xpress SARS-CoV-2/FLU/RSV testing.  Fact Sheet for Patients: EntrepreneurPulse.com.au  Fact Sheet for Healthcare Providers: IncredibleEmployment.be  This test is not yet approved or cleared by the [place] FDA and has been authorized for detection and/or diagnosis of SARS-CoV-2 by FDA under an Emergency Use Authorization (EUA). This EUA will remain in effect (meaning this test can be used) for the duration of the COVID-19 declaration under Section 564(b)(1) of the Act, 21 U.S.C. section 360bbb-3(b)(1), unless the authorization is terminated or revoked.  Performed at [facility], [address] [place], [place] [address]   Urine Culture     Status: Abnormal   Collection Time: 11/26/20  7:41 PM   Specimen: Urine, Catheterized  Result Value Ref Range Status   Specimen Description   Final     Urine Performed at [facility], [address] [place], [place] [address]    Special Requests   Final    NONE Performed at [facility], [address], [place], [place] [address]    Culture MULTIPLE SPECIES PRESENT, SUGGEST RECOLLECTION (A)  Final   Report Status 11/28/2020 FINAL  Final  Blood culture (routine x 2)     Status: None (Preliminary result)   Collection Time: 11/26/20  7:41 PM   Specimen: BLOOD  Result Value Ref Range Status   Specimen Description BLOOD RIGHT ANTECUBITAL  Final   Special Requests   Final    BOTTLES DRAWN AEROBIC AND ANAEROBIC Blood Culture adequate volume   Culture   Final    NO GROWTH 3 DAYS Performed at [facility], [address], [place], [place] [address]    Report Status PENDING  Incomplete  Blood culture (routine x 2)     Status: None (Preliminary result)   Collection Time: 11/26/20  8:38  PM   Specimen: Left Antecubital; Blood  Result Value Ref Range Status   Specimen Description LEFT ANTECUBITAL  Final   Special Requests   Final    BOTTLES DRAWN AEROBIC AND ANAEROBIC Blood Culture adequate volume   Culture   Final    NO GROWTH 3 DAYS Performed at [facility], [address], [place], [place] [address]    Report Status PENDING  Incomplete  MRSA PCR Screening     Status: None   Collection Time: 11/27/20 12:04 AM   Specimen: Nasal Mucosa; Nasopharyngeal  Result Value Ref Range Status   MRSA by PCR NEGATIVE NEGATIVE Final    Comment:        The GeneXpert MRSA Assay (FDA approved for NASAL specimens only), is one component of a comprehensive MRSA colonization surveillance program. It is not intended to diagnose MRSA infection nor to guide or monitor treatment for MRSA infections. Performed at [facility], [address] [place], [place] [address]   Culture, Respiratory w Gram Stain     Status: None   Collection Time: 11/27/20  3:11 AM   Specimen: Tracheal Aspirate  Result Value Ref Range Status   Specimen Description   Final     TRACHEAL ASPIRATE Performed at [facility], [address] [place], [place] [address]    Special Requests   Final    NONE Performed at [facility], [address] [place], [place] [address]    Gram Stain   Final    ABUNDANT WBC PRESENT,BOTH PMN AND MONONUCLEAR FEW GRAM POSITIVE COCCI    Culture   Final    FEW Normal respiratory flora-no Staph aureus or Pseudomonas seen Performed at [facility] [address], [place], [place] [address]    Report Status 11/29/2020 FINAL  Final     Radiology Studies: No results found.  Scheduled Meds: . cefdinir  300 mg Oral Q12H  . Chlorhexidine Gluconate Cloth  6 each Topical Daily  . fluticasone furoate-vilanterol  1 puff Inhalation Daily  . folic acid  1 mg Oral Daily  . heparin  5,000 Units Subcutaneous Q8H  . mouth rinse  15 mL Mouth Rinse BID  . multivitamin with minerals  1 tablet Oral Daily  . thiamine  100 mg Oral Daily   Or  . thiamine  100 mg Intravenous Daily   Continuous Infusions: . sodium chloride 100 mL/hr (11/29/20 4401)  . sodium chloride       LOS: 3 days    Time spent: 35 minutes  Barton Dubois, MD [facility]   To contact the attending provider between 7A-7P or the covering provider during after hours 7P-7A, please log into the web site [email or web address] and access using universal [place] password for that web site. If you do not have the password, please call the hospital operator.  11/29/2020, 6:03 PM

## 2020-11-30 MED ORDER — SACCHAROMYCES BOULARDII 250 MG PO CAPS
250.0000 mg | ORAL_CAPSULE | Freq: Two times a day (BID) | ORAL | Status: DC
  Administered 2020-11-30 – 2020-12-03 (×6): 250 mg via ORAL
  Filled 2020-11-30 (×6): qty 1

## 2020-11-30 MED ORDER — MELATONIN 3 MG PO TABS
3.0000 mg | ORAL_TABLET | Freq: Every day | ORAL | Status: DC
  Administered 2020-11-30 – 2020-12-02 (×3): 3 mg via ORAL
  Filled 2020-11-30 (×3): qty 1

## 2020-11-30 NOTE — NC FL2 (Signed)
[place] LEVEL OF CARE SCREENING TOOL     IDENTIFICATION  Patient Name: Steven Howard Birthdate: 1951-03-12 Sex: male Admission Date (Current Location): 11/26/2020  [facility] and [place] Number:  [employer] and Address:  [place] [address], [place]      Provider Number: [id number]  Attending Physician Name and Address:  Barton Dubois, MD  Relative Name and Phone Number:  Rayne Du [facility])   [phone]    Current Level of Care: Hospital Recommended Level of Care: [place] Prior Approval Number:    Date Approved/Denied: 11/30/20 PASRR Number: Pending  Discharge Plan: SNF    Current Diagnoses: Patient Active Problem List   Diagnosis Date Noted  . Lactic acidosis 11/27/2020  . Hypochloremia 11/27/2020  . Community acquired pneumonia 11/27/2020  . Acute metabolic encephalopathy 02/20/7627  . Respiratory failure with hypoxia and hypercapnia ([place]) 11/26/2020  . Rash 11/25/2020  . Impetigo 11/25/2020  . Hypoxia 06/20/2020  . Hospital discharge follow-up 06/20/2020  . Acute diastolic CHF (congestive heart failure) ([place]) 06/04/2020  . Respiratory failure with hypoxia ([place]) 05/25/2020  . Nicotine abuse 05/25/2020  . COPD (chronic obstructive pulmonary disease)/Emphysema 05/25/2020  . Acute respiratory failure with hypoxia and hypercapnia ([place]) 05/25/2020  . Acute exacerbation of CHF (congestive heart failure) ([place]) 05/25/2020  . COPD with acute exacerbation ([place]) 05/25/2020  . Hypotension 12/18/2018  . Hyponatremia 07/11/2018  . Sleeps in sitting position due to orthopnea 05/19/2018  . OSA and COPD overlap syndrome ([place]) 05/19/2018  . 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 Excessive daytime sleepiness 05/19/2018  . Other sleep apnea 04/20/2018  . Prostate cancer ([place]) 04/18/2018  . Acute lower UTI 04/18/2018  . Pulmonary emphysema ([place]) 04/18/2018  . Essential  hypertension 04/18/2018  . Alcohol abuse 04/18/2018  . Mixed hyperlipidemia 04/18/2018  . Hematuria 04/18/2018  . Recurrent major depressive disorder, in partial remission ([place]) 04/18/2018  . Cigarette nicotine dependence without complication 31/51/7616  . PVD (peripheral vascular disease) ([place]) 04/18/2018    Orientation RESPIRATION BLADDER Height & Weight     Self,Time,Situation,Place  O2 (3L) External catheter Weight: 200 lb (90.7 kg) Height:  6' (182.9 cm)  BEHAVIORAL SYMPTOMS/MOOD NEUROLOGICAL BOWEL NUTRITION STATUS      Continent Diet (5x)  AMBULATORY STATUS COMMUNICATION OF NEEDS Skin   Extensive Assist Verbally Normal                       Personal Care Assistance Level of Assistance  Bathing,Feeding,Dressing Bathing Assistance: Limited assistance Feeding assistance: Maximum assistance Dressing Assistance: Maximum assistance     Functional Limitations Info  Hearing,Sight,Speech Sight Info: Impaired Hearing Info: Adequate Speech Info: Adequate    SPECIAL CARE FACTORS FREQUENCY  PT (By licensed PT)     PT Frequency: 5x              Contractures Contractures Info: Not present    Additional Factors Info  Code Status,Allergies Code Status Info: Full Allergies Info: N/A           Current Medications (11/30/2020):  This is the current hospital active medication list Current Facility-Administered Medications  Medication Dose Route Frequency Provider Last Rate Last Admin  . 0.9 %  sodium chloride infusion  100 mL/hr Intravenous Continuous Barton Dubois, MD 100 mL/hr at 11/30/20 0402 100 mL/hr at 11/30/20 0402  . 0.9 %  sodium chloride infusion  250 mL Intravenous Continuous Dorie Rank, MD      .  acetaminophen (TYLENOL) tablet 650 mg  650 mg Oral Q6H PRN Zierle-Ghosh, Asia B, DO       Or  . acetaminophen (TYLENOL) suppository 650 mg  650 mg Rectal Q6H PRN Zierle-Ghosh, Asia B, DO      . bisacodyl (DULCOLAX) suppository 10 mg  10 mg Rectal Daily PRN  Zierle-Ghosh, Asia B, DO      . cefdinir (OMNICEF) capsule 300 mg  300 mg Oral Q12H Barton Dubois, MD   300 mg at 11/30/20 0913  . Chlorhexidine Gluconate Cloth 2 % PADS 6 each  6 each Topical Daily Zierle-Ghosh, Asia B, DO   6 each at 11/28/20 1143  . fluticasone furoate-vilanterol (BREO ELLIPTA) 100-25 MCG/INH 1 puff  1 puff Inhalation Daily Barton Dubois, MD   1 puff at 11/30/20 [phone]  . folic acid (FOLVITE) tablet 1 mg  1 mg Oral Daily Barton Dubois, MD   1 mg at 11/30/20 0913  . heparin injection 5,000 Units  5,000 Units Subcutaneous Q8H Zierle-Ghosh, Asia B, DO   5,000 Units at 11/30/20 1740  . ipratropium-albuterol (DUONEB) 0.5-2.5 (3) MG/3ML nebulizer solution 3 mL  3 mL Nebulization Q4H PRN Zierle-Ghosh, Asia B, DO      . loperamide (IMODIUM) capsule 2 mg  2 mg Oral PRN Barton Dubois, MD   2 mg at 11/29/20 2104  . LORazepam (ATIVAN) tablet 1-4 mg  1-4 mg Oral Q1H PRN Barton Dubois, MD       Or  . LORazepam (ATIVAN) injection 1-4 mg  1-4 mg Intravenous Q1H PRN Barton Dubois, MD      . MEDLINE mouth rinse  15 mL Mouth Rinse BID Barton Dubois, MD   15 mL at 11/30/20 0913  . metoprolol tartrate (LOPRESSOR) injection 5 mg  5 mg Intravenous Q6H PRN Zierle-Ghosh, Asia B, DO      . morphine 2 MG/ML injection 2 mg  2 mg Intravenous Q4H PRN Barton Dubois, MD      . multivitamin with minerals tablet 1 tablet  1 tablet Oral Daily Barton Dubois, MD   1 tablet at 11/30/20 0913  . nicotine (NICODERM CQ - dosed in mg/24 hours) patch 21 mg  21 mg Transdermal Daily PRN Zierle-Ghosh, Asia B, DO   21 mg at 11/29/20 1834  . ondansetron (ZOFRAN) tablet 4 mg  4 mg Oral Q6H PRN Zierle-Ghosh, Asia B, DO       Or  . ondansetron (ZOFRAN) injection 4 mg  4 mg Intravenous Q6H PRN Zierle-Ghosh, Asia B, DO      . thiamine tablet 100 mg  100 mg Oral Daily Barton Dubois, MD   100 mg at 11/30/20 8144   Or  . thiamine (B-1) injection 100 mg  100 mg Intravenous Daily Barton Dubois, MD         Discharge  Medications: Please see discharge summary for a list of discharge medications.  Relevant Imaging Results:  Relevant Lab Results:   Additional Information Pt YJE:[id number]  Natasha Bence, LCSW

## 2020-11-30 NOTE — TOC Progression Note (Signed)
Transition of Care [facility]) - Progression Note    Patient Details  Name: Steven Howard MRN: [id number] Date of Birth: 07/20/1951  [facility] CM/SW Contact  Natasha Bence, LCSW Phone Number: 11/30/2020, 5:40 PM  Clinical Narrative:    Patient agreeable to SNF. CSW faxed out patient. [facility] reported that facility is to start auth. CSW followed up with patient's previous SNF [facility]. Clementeen Graham reported that she will review patient, but doesn't believe he had any behavioral issues and will most likely be able to make a bed offer. TOC to follow.      Barriers to Discharge: Continued Medical Work up  Expected Discharge Plan and Services                                                 Social Determinants of Health (SDOH) Interventions    Readmission Risk Interventions Readmission Risk Prevention Plan 06/07/2020  Transportation Screening Complete  PCP or Specialist Appt within 5-7 Days Complete  Home Care Screening Complete  Medication Review (RN CM) Complete  Some recent data might be hidden

## 2020-11-30 NOTE — Progress Notes (Signed)
PROGRESS NOTE    Steven Howard  MHD:[id number] DOB: March 01, 1951 DOA: 11/26/2020 PCP: Claretta Fraise, MD  No chief complaint on file.   Brief admission narrative:  As per H&P written by Dr. Clearence Howard on 11/27/20 Steven Howard  is a 70 y.o. male, with history of sleep apnea, glaucoma, COPD, cancer, presents the ED with a chief complaint of "breathing issues."  History is unclear, but this shortness of breath seems to be the reason that EMS was called out to the home.  When EMS arrived there is reported that he said he was not having any trouble breathing he was just drinking.  Family members had reported that his pulse ox was low, but was able to convince him to be transported by EMS to the hospital.  Patient was initially at his baseline and then had an abrupt change in mental status while with EMS.  Lection saturations dropped and they put him on a nonrebreather, patient became unresponsive.  When he arrived to the ED his oxygen saturations were in the 80s on a nonrebreather, patient had a GCS of 3.  Patient was intubated to protect his airway.  No further history could be obtained.  In the ED Temp 96.7, heart rate 71-119 at time of admission heart rate is 72, blood pressure as low as systolic 21J over 94R this is likely secondary to propofol as blood pressure normalized when propofol was switched to Precedex, respiratory rate 15-24, O2 saturations 84% to 100% Chemistry panel shows hyponatremia at 132, hypochloremia at 90 Patient was given 1 L of normal saline, 1 L of LR, 500 of normal saline Patient was given fentanyl, Versed, propofol, succinylcholine for intubation sedating purposes After patient was intubated he became more responsive and was trying to pull out his tubes who is put in nonviolent restraints.  Lactic acid was initially elevated at 2.6, and then repeat 10.6 Alcohol level was 365 UDS negative Initial ABG showed a pH of 7.3 and a hypercapnia at 57 Repeat ABG showed pH of  7.45, PCO2 of 39 Admission requested for further work-up of altered mental status and respiratory failure  Assessment & Plan: 1-acute hypoxic and hypercapnic respiratory failure -In the setting of alcohol intoxication and lack of CPAP usage. -Extubated and with good oxygen saturation on 2 L nasal cannula supplementation. -Assess oxygen desaturation screening. -Continue to wean of oxygen supplementation as tolerated. -Continue CPAP nightly; education provided about importance of compliance. -Continue home inhalers/bronchodilators -Starting flutter valve.  2-history of COPD -Currently no wheezing -Continue as needed DuoNeb  -Continue the use of Breo Ellipta.  3-alcohol abuse/intoxication/withdrawal -Currently off Precedex -Continue CIWA protocol and as needed Ativan -Continue thiamine and folic acid -Cessation counseling provided.  4-left lower lobe pneumonia -Continue the use of flutter valve, wean oxygen supplementation as tolerated -Transition antibiotics to Augmentin orally -Continue supportive care.  5-dehydration/hyponatremia/hypochloremia -In the setting of poor oral intake and alcohol abuse -Continue to maintain adequate hydration -Now tolerating diet. -Continue to follow electrolytes trend.  6-acute metabolic encephalopathy in the setting of hypercapnia and alcohol intoxication/withdrawal -Improved; oriented x3 currently -Continue to follow response.  7-physical deconditioning -Patient seen by physical therapy with recommendation for a skilled nursing facility short-term rehab -Patient in agreement; continue avoiding bed availability/concern catheterization. -TOC aware.  8-restless night/insomnia -Will use melatonin nightly  DVT prophylaxis: Heparin Code Status: Full code. Family Communication: No family at bedside. Disposition:   Status is: Inpatient  Dispo: The patient is from: home  Anticipated d/c is to: SNF              Patient  currently is medically stable for discharge to SNF when bed available. Still feeling weak, deconditioned and admits to poor balance.  Physical therapy recommending skilled nursing facility.  No fever, improved air movement and only requiring 2 L nasal cannula supplementation.  Patient without active withdrawal symptoms; continue CIWA protocol monitoring.  Transition antibiotics to oral route   Difficult to place patient no       Consultants:   Pulmonology initially curbside when patient presented in need of transient intubation.   Procedures:  See below for x-ray report.  Antimicrobials:  Rocephin/Zithromax discontinued on 11/29/20 Cefdinir 11/29/20  Subjective: No fever, no chest pain, no nausea or vomiting.  Reports restless night and difficulty tolerating CPAP.  Objective: Vitals:   11/30/20 0635 11/30/20 1500 11/30/20 1600 11/30/20 1622  BP: (!) 173/87     Pulse: 94     Resp: 20     Temp:      TempSrc:      SpO2: 94% 99% 97% 97%  Weight:      Height:        Intake/Output Summary (Last 24 hours) at 11/30/2020 1722 Last data filed at 11/30/2020 1500 Gross per 24 hour  Intake 150 ml  Output 2350 ml  Net -2200 ml   Filed Weights   11/26/20 1743  Weight: 90.7 kg    Examination: General exam: Alert, awake, oriented x 3; reports feeling weak and tired.  No chest pain, no nausea, no vomiting.  Restless night and difficulty sleeping/tolerating CPAP; he expressed using it for over 5 hours. Respiratory system: Positive scattered rhonchi; no wheezing, no crackles.  Camitta movement bilaterally.  Good saturation on 2 L. Cardiovascular system:RRR. No murmurs, rubs, gallops.  No JVD. Gastrointestinal system: Abdomen is nondistended, soft and nontender. No organomegaly or masses felt. Normal bowel sounds heard. Central nervous system: Alert and oriented. No focal neurological deficits. Extremities: No cyanosis or clubbing. Skin: No petechiae. Psychiatry: Judgement and insight  appear normal. Mood & affect appropriate.   Data Reviewed: I have personally reviewed following labs and imaging studies  CBC: Recent Labs  Lab 11/26/20 1740 11/27/20 0616 11/28/20 1903  WBC 9.3 9.1 6.4  NEUTROABS 3.6 5.9  --   HGB 17.5* 17.1* 15.8  HCT 51.6 50.5 47.1  MCV 100.8* 100.6* 100.9*  PLT 189 139* 142*    Basic Metabolic Panel: Recent Labs  Lab 11/26/20 1740 11/27/20 0616 11/28/20 1903  NA 132* 136 133*  K 4.1 4.0 3.3*  CL 90* 98 99  CO2 30 24 27   GLUCOSE 108* 67* 139*  BUN 13 9 5*  CREATININE 0.64 0.52* 0.54*  CALCIUM 8.3* 7.9* 8.1*  MG  --  1.5* 1.4*  PHOS  --  1.9* 1.5*    GFR: Estimated Creatinine Clearance: 93 mL/min (A) (by C-G formula based on SCr of 0.54 mg/dL (L)).  Liver Function Tests: Recent Labs  Lab 11/26/20 1740 11/27/20 0616 11/28/20 1903  AST 26 21 19   ALT 13 12 9   ALKPHOS 88 81 72  BILITOT 0.3 0.9 0.9  PROT 6.7 5.9* 5.6*  ALBUMIN 3.3* 2.7* 2.5*    CBG: Recent Labs  Lab 11/26/20 1742 11/27/20 0739 11/27/20 1107 11/27/20 1936 11/28/20 1540  GLUCAP 135* 75 87 213* 83     Recent Results (from the past 240 hour(s))  Resp Panel by RT-PCR (Flu A&B, Covid) Nasopharyngeal Swab  Status: None   Collection Time: 11/26/20  5:56 PM   Specimen: Nasopharyngeal Swab; Nasopharyngeal(NP) swabs in vial transport medium  Result Value Ref Range Status   SARS Coronavirus 2 by RT PCR NEGATIVE NEGATIVE Final    Comment: (NOTE) SARS-CoV-2 target nucleic acids are NOT DETECTED.  The SARS-CoV-2 RNA is generally detectable in upper respiratory specimens during the acute phase of infection. The lowest concentration of SARS-CoV-2 viral copies this assay can detect is 138 copies/mL. A negative result does not preclude SARS-Cov-2 infection and should not be used as the sole basis for treatment or other patient management decisions. A negative result may occur with  improper specimen collection/handling, submission of specimen other than  nasopharyngeal swab, presence of viral mutation(s) within the areas targeted by this assay, and inadequate number of viral copies(<138 copies/mL). A negative result must be combined with clinical observations, patient history, and epidemiological information. The expected result is Negative.  Fact Sheet for Patients:  EntrepreneurPulse.com.au  Fact Sheet for Healthcare Providers:  IncredibleEmployment.be  This test is no t yet approved or cleared by the [place] FDA and  has been authorized for detection and/or diagnosis of SARS-CoV-2 by FDA under an Emergency Use Authorization (EUA). This EUA will remain  in effect (meaning this test can be used) for the duration of the COVID-19 declaration under Section 564(b)(1) of the Act, 21 U.S.C.section 360bbb-3(b)(1), unless the authorization is terminated  or revoked sooner.       Influenza A by PCR NEGATIVE NEGATIVE Final   Influenza B by PCR NEGATIVE NEGATIVE Final    Comment: (NOTE) The Xpert Xpress SARS-CoV-2/FLU/RSV plus assay is intended as an aid in the diagnosis of influenza from Nasopharyngeal swab specimens and should not be used as a sole basis for treatment. Nasal washings and aspirates are unacceptable for Xpert Xpress SARS-CoV-2/FLU/RSV testing.  Fact Sheet for Patients: EntrepreneurPulse.com.au  Fact Sheet for Healthcare Providers: IncredibleEmployment.be  This test is not yet approved or cleared by the [place] FDA and has been authorized for detection and/or diagnosis of SARS-CoV-2 by FDA under an Emergency Use Authorization (EUA). This EUA will remain in effect (meaning this test can be used) for the duration of the COVID-19 declaration under Section 564(b)(1) of the Act, 21 U.S.C. section 360bbb-3(b)(1), unless the authorization is terminated or revoked.  Performed at [facility], [address] [place], [place] [address]   Urine  Culture     Status: Abnormal   Collection Time: 11/26/20  7:41 PM   Specimen: Urine, Catheterized  Result Value Ref Range Status   Specimen Description   Final    Urine Performed at [facility], [address] [place], [place] [address]    Special Requests   Final    NONE Performed at [facility], [address] [place], [place] [address]    Culture MULTIPLE SPECIES PRESENT, SUGGEST RECOLLECTION (A)  Final   Report Status 11/28/2020 FINAL  Final  Blood culture (routine x 2)     Status: None (Preliminary result)   Collection Time: 11/26/20  7:41 PM   Specimen: BLOOD  Result Value Ref Range Status   Specimen Description BLOOD RIGHT ANTECUBITAL  Final   Special Requests   Final    BOTTLES DRAWN AEROBIC AND ANAEROBIC Blood Culture adequate volume   Culture   Final    NO GROWTH 4 DAYS Performed at [facility], [address], [place], [place]  [address]    Report Status PENDING  Incomplete  Blood culture (routine  x 2)     Status: None (Preliminary result)   Collection Time: 11/26/20  8:38 PM   Specimen: Left Antecubital; Blood  Result Value Ref Range Status   Specimen Description LEFT ANTECUBITAL  Final   Special Requests   Final    BOTTLES DRAWN AEROBIC AND ANAEROBIC Blood Culture adequate volume   Culture   Final    NO GROWTH 4 DAYS Performed at [facility], [address] [place], [place] [address]    Report Status PENDING  Incomplete  MRSA PCR Screening     Status: None   Collection Time: 11/27/20 12:04 AM   Specimen: Nasal Mucosa; Nasopharyngeal  Result Value Ref Range Status   MRSA by PCR NEGATIVE NEGATIVE Final    Comment:        The GeneXpert MRSA Assay (FDA approved for NASAL specimens only), is one component of a comprehensive MRSA colonization surveillance program. It is not intended to diagnose MRSA infection nor to guide or monitor treatment for MRSA infections. Performed at [facility], [address] [place], [place] [address]   Culture, Respiratory  w Gram Stain     Status: None   Collection Time: 11/27/20  3:11 AM   Specimen: Tracheal Aspirate  Result Value Ref Range Status   Specimen Description   Final    TRACHEAL ASPIRATE Performed at [facility], [address], [place], [place] [address]    Special Requests   Final    NONE Performed at [facility], [address], [place], [place] [address]    Gram Stain   Final    ABUNDANT WBC PRESENT,BOTH PMN AND MONONUCLEAR FEW GRAM POSITIVE COCCI    Culture   Final    FEW Normal respiratory flora-no Staph aureus or Pseudomonas seen Performed at [facility] [address] [place], [place] [address]    Report Status 11/29/2020 FINAL  Final     Radiology Studies: No results found.  Scheduled Meds: . cefdinir  300 mg Oral Q12H  . fluticasone furoate-vilanterol  1 puff Inhalation Daily  . folic acid  1 mg Oral Daily  . heparin  5,000 Units Subcutaneous Q8H  . mouth rinse  15 mL Mouth Rinse BID  . multivitamin with minerals  1 tablet Oral Daily  . saccharomyces boulardii  250 mg Oral BID  . thiamine  100 mg Oral Daily   Or  . thiamine  100 mg Intravenous Daily   Continuous Infusions: . sodium chloride 100 mL/hr (11/30/20 0402)  . sodium chloride       LOS: 4 days    Time spent: 30 minutes  Barton Dubois, MD [facility]   To contact the attending provider between 7A-7P or the covering provider during after hours 7P-7A, please log into the web site [email or web address] and access using universal [place] password for that web site. If you do not have the password, please call the hospital operator.  11/30/2020, 5:22 PM

## 2020-12-01 LAB — CULTURE, BLOOD (ROUTINE X 2)
Culture: NO GROWTH
Culture: NO GROWTH
Special Requests: ADEQUATE
Special Requests: ADEQUATE

## 2020-12-01 LAB — CBC
HCT: 50 % (ref 39.0–52.0)
Hemoglobin: 16.6 g/dL (ref 13.0–17.0)
MCH: 33.8 pg (ref 26.0–34.0)
MCHC: 33.2 g/dL (ref 30.0–36.0)
MCV: 101.8 fL — ABNORMAL HIGH (ref 80.0–100.0)
Platelets: 144 10*3/uL — ABNORMAL LOW (ref 150–400)
RBC: 4.91 MIL/uL (ref 4.22–5.81)
RDW: 15.8 % — ABNORMAL HIGH (ref 11.5–15.5)
WBC: 6.1 10*3/uL (ref 4.0–10.5)
nRBC: 0 % (ref 0.0–0.2)

## 2020-12-01 LAB — BASIC METABOLIC PANEL
Anion gap: 9 (ref 5–15)
BUN: 5 mg/dL — ABNORMAL LOW (ref 8–23)
CO2: 31 mmol/L (ref 22–32)
Calcium: 8.4 mg/dL — ABNORMAL LOW (ref 8.9–10.3)
Chloride: 96 mmol/L — ABNORMAL LOW (ref 98–111)
Creatinine, Ser: 0.45 mg/dL — ABNORMAL LOW (ref 0.61–1.24)
GFR, Estimated: >60 mL/min (ref >60)
Glucose, Bld: 83 mg/dL (ref 70–99)
Potassium: 3.1 mmol/L — ABNORMAL LOW (ref 3.5–5.1)
Sodium: 136 mmol/L (ref 135–145)

## 2020-12-01 MED ORDER — POTASSIUM CHLORIDE CRYS ER 20 MEQ PO TBCR
40.0000 meq | EXTENDED_RELEASE_TABLET | Freq: Every day | ORAL | Status: DC
  Administered 2020-12-01 – 2020-12-03 (×3): 40 meq via ORAL
  Filled 2020-12-01 (×3): qty 2

## 2020-12-01 NOTE — TOC Initial Note (Signed)
Transition of Care [facility]) - Initial/Assessment Note    Patient Details  Name: Steven Howard MRN: [id number] Date of Birth: 1951-01-13  Transition of Care [facility]) CM/SW Contact:    Boneta Lucks, RN Phone Number: 12/01/2020, 1:52 PM  Clinical Narrative:      [name] will make bed offer to take patient back to [facility].  Patient is accepting the bed offer. [facility] will start insurance Authorization.  TOC left Sister a message. TOC also had a consult for [facility] resources.  Patient states he is no longer drinking and does not need that information.           Expected Discharge Plan: Skilled Nursing Facility Barriers to Discharge: Continued Medical Work up,Insurance Authorization   Patient Goals and CMS Choice Patient states their goals for this hospitalization and ongoing recovery are:: to go to SNF CMS Medicare.gov Compare Post Acute Care list provided to:: Patient Represenative (must comment) Choice offered to / list presented to : Sibling  Expected Discharge Plan and Services Expected Discharge Plan: [place]     Prior Living Arrangements/Services     Patient language and need for interpreter reviewed:: Yes        Need for Family Participation in Patient Care: Yes (Comment) Care giver support system in place?: Yes (comment)   Criminal Activity/Legal Involvement Pertinent to Current Situation/Hospitalization: No - Comment as needed  Activities of Daily Living Home Assistive Devices/Equipment: CPAP,Eyeglasses ADL Screening (condition at time of admission) Patient's cognitive ability adequate to safely complete daily activities?: Yes Is the patient deaf or have difficulty hearing?: No Does the patient have difficulty seeing, even when wearing glasses/contacts?: No Does the patient have difficulty concentrating, remembering, or making decisions?: No Patient able to express need for assistance with ADLs?: Yes Does the patient have difficulty dressing or  bathing?: No Independently performs ADLs?: Yes (appropriate for developmental age) Does the patient have difficulty walking or climbing stairs?: No Weakness of Legs: Both Weakness of Arms/Hands: Both  Permission Sought/Granted   Emotional Assessment    Alcohol / Substance Use: Alcohol Use Psych Involvement: No (comment)  Admission diagnosis:  Respiratory acidosis [E87.2] Acute cystitis without hematuria [U82.80] Alcoholic intoxication with complication (HCC) [K34.917] Respiratory failure with hypoxia and hypercapnia (HCC) [J96.91, J96.92] Altered mental status, unspecified altered mental status type [R41.82] Patient Active Problem List   Diagnosis Date Noted  . Lactic acidosis 11/27/2020  . Hypochloremia 11/27/2020  . Community acquired pneumonia 11/27/2020  . Acute metabolic encephalopathy 91/50/5697  . Respiratory failure with hypoxia and hypercapnia ([place]) 11/26/2020  . Rash 11/25/2020  . Impetigo 11/25/2020  . Hypoxia 06/20/2020  . Hospital discharge follow-up 06/20/2020  . Acute diastolic CHF (congestive heart failure) ([place]) 06/04/2020  . Respiratory failure with hypoxia ([place]) 05/25/2020  . Nicotine abuse 05/25/2020  . COPD (chronic obstructive pulmonary disease)/Emphysema 05/25/2020  . Acute respiratory failure with hypoxia and hypercapnia ([place]) 05/25/2020  . Acute exacerbation of CHF (congestive heart failure) ([place]) 05/25/2020  . COPD with acute exacerbation ([place]) 05/25/2020  . Hypotension 12/18/2018  . Hyponatremia 07/11/2018  . Sleeps in sitting position due to orthopnea 05/19/2018  . OSA and COPD overlap syndrome ([place]) 05/19/2018  . 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 Excessive daytime sleepiness 05/19/2018  . Other sleep apnea 04/20/2018  . Prostate cancer ([place]) 04/18/2018  . Acute lower UTI 04/18/2018  . Pulmonary emphysema ([place]) 04/18/2018  . Essential hypertension 04/18/2018  . Alcohol abuse 04/18/2018  . Mixed hyperlipidemia  04/18/2018  .  Hematuria 04/18/2018  . Recurrent major depressive disorder, in partial remission ([place]) 04/18/2018  . Cigarette nicotine dependence without complication 14/38/8875  . PVD (peripheral vascular disease) ([place]) 04/18/2018   PCP:  Claretta Fraise, MD Pharmacy:   [facility], [place] [address] Phone: [phone] Fax: [phone]  Readmission Risk Interventions Readmission Risk Prevention Plan 12/01/2020 06/07/2020  Transportation Screening Complete Complete  PCP or Specialist Appt within 5-7 Days - Complete  Home Care Screening - Complete  Medication Review (RN CM) - Complete  HRI or Home Care Consult Complete -  Social Work Consult for Recovery Care Planning/Counseling Complete -  Palliative Care Screening Not Applicable -  Medication Review (RN Care Manager) Complete -  Some recent data might be hidden

## 2020-12-01 NOTE — Care Management Important Message (Signed)
Important Message  Patient Details  Name: Steven Howard MRN: [id number] Date of Birth: Mar 03, 1951   Medicare Important Message Given:  Yes     Tommy Medal 12/01/2020, 1:55 PM

## 2020-12-01 NOTE — Progress Notes (Signed)
SATURATION QUALIFICATIONS: (This note is used to comply with regulatory documentation for home oxygen)  Patient Saturations on Room Air at Rest = 89%   Patient Saturations on Room Air while Ambulating = 86%  Patient Saturations on 1 Liters of oxygen while Ambulating = 91%  Please briefly explain why patient needs home oxygen: SOB at rest and with exertion.

## 2020-12-01 NOTE — Progress Notes (Signed)
Patient requested to not use BiPAP tonight as he has been having trouble sleeping with it on.

## 2020-12-01 NOTE — Progress Notes (Signed)
PROGRESS NOTE    Steven Howard  NKN:[id number] DOB: 1951-07-31 DOA: 11/26/2020 PCP: Claretta Fraise, MD  No chief complaint on file.   Brief admission narrative:  As per H&P written by Dr. Clearence Ped on 11/27/20 Steven Howard  is a 70 y.o. male, with history of sleep apnea, glaucoma, COPD, cancer, presents the ED with a chief complaint of "breathing issues."  History is unclear, but this shortness of breath seems to be the reason that EMS was called out to the home.  When EMS arrived there is reported that he said he was not having any trouble breathing he was just drinking.  Family members had reported that his pulse ox was low, but was able to convince him to be transported by EMS to the hospital.  Patient was initially at his baseline and then had an abrupt change in mental status while with EMS.  Lection saturations dropped and they put him on a nonrebreather, patient became unresponsive.  When he arrived to the ED his oxygen saturations were in the 80s on a nonrebreather, patient had a GCS of 3.  Patient was intubated to protect his airway.  No further history could be obtained.  In the ED Temp 96.7, heart rate 71-119 at time of admission heart rate is 72, blood pressure as low as systolic 37T over 02I this is likely secondary to propofol as blood pressure normalized when propofol was switched to Precedex, respiratory rate 15-24, O2 saturations 84% to 100% Chemistry panel shows hyponatremia at 132, hypochloremia at 90 Patient was given 1 L of normal saline, 1 L of LR, 500 of normal saline Patient was given fentanyl, Versed, propofol, succinylcholine for intubation sedating purposes After patient was intubated he became more responsive and was trying to pull out his tubes who is put in nonviolent restraints.  Lactic acid was initially elevated at 2.6, and then repeat 10.6 Alcohol level was 365 UDS negative Initial ABG showed a pH of 7.3 and a hypercapnia at 57 Repeat ABG showed pH of  7.45, PCO2 of 39 Admission requested for further work-up of altered mental status and respiratory failure  Assessment & Plan: 1-acute hypoxic and hypercapnic respiratory failure -In the setting of alcohol intoxication and lack of CPAP usage. -Extubated and with good oxygen saturation on 2 L nasal cannula supplementation. -Assess oxygen desaturation screening. -Continue to wean of oxygen supplementation as tolerated. -Continue CPAP nightly; education provided about importance of compliance. -Continue home inhalers/bronchodilators -Starting flutter valve.  2-history of COPD -Currently no wheezing -Continue as needed DuoNeb  -Continue the use of Breo Ellipta.  3-alcohol abuse/intoxication/withdrawal -Currently off Precedex -Continue CIWA protocol and as needed Ativan -Continue thiamine and folic acid -Cessation counseling provided.  4-left lower lobe pneumonia -Continue the use of flutter valve, wean oxygen supplementation as tolerated -Transition antibiotics to Augmentin orally -Continue supportive care.  5-dehydration/hyponatremia/hypochloremia -In the setting of poor oral intake and alcohol abuse -Continue to maintain adequate hydration -Now tolerating diet. -Continue to follow electrolytes trend.  6-acute metabolic encephalopathy in the setting of hypercapnia and alcohol intoxication/withdrawal -Improved; oriented x3 currently -Continue to follow response.  7-physical deconditioning -Patient seen by physical therapy with recommendation for a skilled nursing facility short-term rehab -Patient in agreement; continue avoiding bed availability/concern catheterization. -TOC aware.  8-restless night/insomnia -Will use melatonin nightly  DVT prophylaxis: Heparin Code Status: Full code. Family Communication: No family at bedside. Disposition:   Status is: Inpatient  Dispo: The patient is from: home  Anticipated d/c is to: SNF              Patient  currently is medically stable for discharge to SNF when bed available. Still feeling weak, deconditioned and admits to poor balance.  Physical therapy recommending skilled nursing facility.  No fever, improved air movement and only requiring 2 L nasal cannula supplementation.  Patient without active withdrawal symptoms; continue CIWA protocol monitoring.  Transition antibiotics to oral route   Difficult to place patient no       Consultants:   Pulmonology initially curbside when patient presented in need of transient intubation.   Procedures:  See below for x-ray report.  Antimicrobials:  Rocephin/Zithromax discontinued on 11/29/20 Cefdinir 11/29/20  Subjective: Reports sleeping better after using melatonin; but failed to use CPAP.  Denies chest pain, no nausea, no vomiting, no abdominal pain or concerns for alcohol withdrawal symptoms currently.  Objective: Vitals:   11/30/20 2055 12/01/20 0533 12/01/20 1209 12/01/20 1418  BP: (!) 137/94 136/87  122/78  Pulse: 88 96  91  Resp: 18 18  17   Temp: 97.8 F (36.6 C) 98 F (36.7 C)  99.3 F (37.4 C)  TempSrc: Oral Oral  Oral  SpO2: 97% 95% 96% 95%  Weight:      Height:        Intake/Output Summary (Last 24 hours) at 12/01/2020 1804 Last data filed at 12/01/2020 1700 Gross per 24 hour  Intake 4148.21 ml  Output 5400 ml  Net -1251.79 ml   Filed Weights   11/26/20 1743  Weight: 90.7 kg    Examination: General exam: Alert, awake, oriented x 3; reports no using CPAP last night; no chest pain, no nausea, no vomiting.  No active control currently appreciated.  Still feeling weak, off balance and deconditioned. Respiratory system: Positive rhonchi bilaterally; requiring 1-2 L nasal cannula supplementation.  No wheezing appreciated on exam.  No using accessory muscle. Cardiovascular system:RRR.  No rubs, no gallops, no JVD.  No murmurs appreciated on exam. Gastrointestinal system: Abdomen is nondistended, soft and nontender. No  organomegaly or masses felt. Normal bowel sounds heard. Central nervous system: Alert and oriented. No focal neurological deficits. Extremities: No cyanosis or clubbing. Skin: No petechiae. Psychiatry: Judgement and insight appear normal. Mood & affect appropriate.   Data Reviewed: I have personally reviewed following labs and imaging studies  CBC: Recent Labs  Lab 11/26/20 1740 11/27/20 0616 11/28/20 1903 12/01/20 0429  WBC 9.3 9.1 6.4 6.1  NEUTROABS 3.6 5.9  --   --   HGB 17.5* 17.1* 15.8 16.6  HCT 51.6 50.5 47.1 50.0  MCV 100.8* 100.6* 100.9* 101.8*  PLT 189 139* 142* 144*    Basic Metabolic Panel: Recent Labs  Lab 11/26/20 1740 11/27/20 0616 11/28/20 1903 12/01/20 0429  NA 132* 136 133* 136  K 4.1 4.0 3.3* 3.1*  CL 90* 98 99 96*  CO2 30 24 27 31   GLUCOSE 108* 67* 139* 83  BUN 13 9 5* 5*  CREATININE 0.64 0.52* 0.54* 0.45*  CALCIUM 8.3* 7.9* 8.1* 8.4*  MG  --  1.5* 1.4*  --   PHOS  --  1.9* 1.5*  --     GFR: Estimated Creatinine Clearance: 93 mL/min (A) (by C-G formula based on SCr of 0.45 mg/dL (L)).  Liver Function Tests: Recent Labs  Lab 11/26/20 1740 11/27/20 0616 11/28/20 1903  AST 26 21 19   ALT 13 12 9   ALKPHOS 88 81 72  BILITOT 0.3 0.9 0.9  PROT 6.7  5.9* 5.6*  ALBUMIN 3.3* 2.7* 2.5*    CBG: Recent Labs  Lab 11/26/20 1742 11/27/20 0739 11/27/20 1107 11/27/20 1936 11/28/20 1540  GLUCAP 135* 75 87 213* 83     Recent Results (from the past 240 hour(s))  Resp Panel by RT-PCR (Flu A&B, Covid) Nasopharyngeal Swab     Status: None   Collection Time: 11/26/20  5:56 PM   Specimen: Nasopharyngeal Swab; Nasopharyngeal(NP) swabs in vial transport medium  Result Value Ref Range Status   SARS Coronavirus 2 by RT PCR NEGATIVE NEGATIVE Final    Comment: (NOTE) SARS-CoV-2 target nucleic acids are NOT DETECTED.  The SARS-CoV-2 RNA is generally detectable in upper respiratory specimens during the acute phase of infection. The lowest concentration  of SARS-CoV-2 viral copies this assay can detect is 138 copies/mL. A negative result does not preclude SARS-Cov-2 infection and should not be used as the sole basis for treatment or other patient management decisions. A negative result may occur with  improper specimen collection/handling, submission of specimen other than nasopharyngeal swab, presence of viral mutation(s) within the areas targeted by this assay, and inadequate number of viral copies(<138 copies/mL). A negative result must be combined with clinical observations, patient history, and epidemiological information. The expected result is Negative.  Fact Sheet for Patients:  EntrepreneurPulse.com.au  Fact Sheet for Healthcare Providers:  IncredibleEmployment.be  This test is no t yet approved or cleared by the [place] FDA and  has been authorized for detection and/or diagnosis of SARS-CoV-2 by FDA under an Emergency Use Authorization (EUA). This EUA will remain  in effect (meaning this test can be used) for the duration of the COVID-19 declaration under Section 564(b)(1) of the Act, 21 U.S.C.section 360bbb-3(b)(1), unless the authorization is terminated  or revoked sooner.       Influenza A by PCR NEGATIVE NEGATIVE Final   Influenza B by PCR NEGATIVE NEGATIVE Final    Comment: (NOTE) The Xpert Xpress SARS-CoV-2/FLU/RSV plus assay is intended as an aid in the diagnosis of influenza from Nasopharyngeal swab specimens and should not be used as a sole basis for treatment. Nasal washings and aspirates are unacceptable for Xpert Xpress SARS-CoV-2/FLU/RSV testing.  Fact Sheet for Patients: EntrepreneurPulse.com.au  Fact Sheet for Healthcare Providers: IncredibleEmployment.be  This test is not yet approved or cleared by the [place] FDA and has been authorized for detection and/or diagnosis of SARS-CoV-2 by FDA under an Emergency Use  Authorization (EUA). This EUA will remain in effect (meaning this test can be used) for the duration of the COVID-19 declaration under Section 564(b)(1) of the Act, 21 U.S.C. section 360bbb-3(b)(1), unless the authorization is terminated or revoked.  Performed at [facility], [address], [place], [place] [address]   Urine Culture     Status: Abnormal   Collection Time: 11/26/20  7:41 PM   Specimen: Urine, Catheterized  Result Value Ref Range Status   Specimen Description   Final    Urine Performed at [facility], [address] [place], [place] [address]    Special Requests   Final    NONE Performed at [facility], [address], [place], [place] [address]    Culture MULTIPLE SPECIES PRESENT, SUGGEST RECOLLECTION (A)  Final   Report Status 11/28/2020 FINAL  Final  Blood culture (routine x 2)     Status: None   Collection Time: 11/26/20  7:41 PM   Specimen: BLOOD  Result Value Ref Range Status   Specimen Description BLOOD RIGHT ANTECUBITAL  Final  Special Requests   Final    BOTTLES DRAWN AEROBIC AND ANAEROBIC Blood Culture adequate volume   Culture   Final    NO GROWTH 5 DAYS Performed at [facility], [address], [place], [place] [address]    Report Status 12/01/2020 FINAL  Final  Blood culture (routine x 2)     Status: None   Collection Time: 11/26/20  8:38 PM   Specimen: Left Antecubital; Blood  Result Value Ref Range Status   Specimen Description LEFT ANTECUBITAL  Final   Special Requests   Final    BOTTLES DRAWN AEROBIC AND ANAEROBIC Blood Culture adequate volume   Culture   Final    NO GROWTH 5 DAYS Performed at [facility], [address], [place], [place] [address]    Report Status 12/01/2020 FINAL  Final  MRSA PCR Screening     Status: None   Collection Time: 11/27/20 12:04 AM   Specimen: Nasal Mucosa; Nasopharyngeal  Result Value Ref Range Status   MRSA by PCR NEGATIVE NEGATIVE Final    Comment:        The GeneXpert MRSA Assay (FDA approved for  NASAL specimens only), is one component of a comprehensive MRSA colonization surveillance program. It is not intended to diagnose MRSA infection nor to guide or monitor treatment for MRSA infections. Performed at [facility], [address] [place], [place] [address]   Culture, Respiratory w Gram Stain     Status: None   Collection Time: 11/27/20  3:11 AM   Specimen: Tracheal Aspirate  Result Value Ref Range Status   Specimen Description   Final    TRACHEAL ASPIRATE Performed at [facility], [address] [place], [place] [address]    Special Requests   Final    NONE Performed at [facility], [address], [place], [place] [address]    Gram Stain   Final    ABUNDANT WBC PRESENT,BOTH PMN AND MONONUCLEAR FEW GRAM POSITIVE COCCI    Culture   Final    FEW Normal respiratory flora-no Staph aureus or Pseudomonas seen Performed at [facility] [address], [place], [place] [address]    Report Status 11/29/2020 FINAL  Final     Radiology Studies: No results found.  Scheduled Meds: . cefdinir  300 mg Oral Q12H  . fluticasone furoate-vilanterol  1 puff Inhalation Daily  . folic acid  1 mg Oral Daily  . heparin  5,000 Units Subcutaneous Q8H  . mouth rinse  15 mL Mouth Rinse BID  . melatonin  3 mg Oral QHS  . multivitamin with minerals  1 tablet Oral Daily  . saccharomyces boulardii  250 mg Oral BID  . thiamine  100 mg Oral Daily   Or  . thiamine  100 mg Intravenous Daily   Continuous Infusions: . sodium chloride 100 mL/hr (12/01/20 1507)  . sodium chloride       LOS: 5 days    Time spent: 30 minutes  Barton Dubois, MD [facility]   To contact the attending provider between 7A-7P or the covering provider during after hours 7P-7A, please log into the web site [email or web address] and access using universal [place] password for that web site. If you do not have the password, please call the hospital operator.  12/01/2020, 6:04 PM

## 2020-12-01 NOTE — Progress Notes (Signed)
Patient has refused CPAP for tonight. 

## 2020-12-02 DIAGNOSIS — F10929 Alcohol use, unspecified with intoxication, unspecified: Secondary | ICD-10-CM

## 2020-12-02 DIAGNOSIS — G4733 Obstructive sleep apnea (adult) (pediatric): Secondary | ICD-10-CM

## 2020-12-02 LAB — RESP PANEL BY RT-PCR (FLU A&B, COVID) ARPGX2
Influenza A by PCR: NEGATIVE
Influenza B by PCR: NEGATIVE
SARS Coronavirus 2 by RT PCR: NEGATIVE

## 2020-12-02 MED ORDER — THIAMINE HCL 100 MG PO TABS: 100.0000 mg | ORAL_TABLET | Freq: Every day | ORAL | Status: DC

## 2020-12-02 MED ORDER — FOLIC ACID 1 MG PO TABS: 1.0000 mg | ORAL_TABLET | Freq: Every day | ORAL | Status: DC

## 2020-12-02 MED ORDER — SACCHAROMYCES BOULARDII 250 MG PO CAPS: 250.0000 mg | ORAL_CAPSULE | Freq: Two times a day (BID) | ORAL | Status: DC

## 2020-12-02 MED ORDER — POTASSIUM CHLORIDE CRYS ER 20 MEQ PO TBCR: 20.0000 meq | EXTENDED_RELEASE_TABLET | Freq: Every day | ORAL | Status: DC

## 2020-12-02 MED ORDER — MUPIROCIN CALCIUM 2 % EX CREA: TOPICAL_CREAM | CUTANEOUS | Status: DC

## 2020-12-02 MED ORDER — CEFDINIR 300 MG PO CAPS: 300.0000 mg | ORAL_CAPSULE | Freq: Two times a day (BID) | ORAL | Status: AC

## 2020-12-02 MED ORDER — MELATONIN 3 MG PO TABS
3.0000 mg | ORAL_TABLET | Freq: Every day | ORAL | Status: DC
Start: 2020-12-02 — End: 2021-10-06

## 2020-12-02 NOTE — Progress Notes (Signed)
Patient has been refusing CPAP and Bipap.  Unit is at bedside.  No distress noted at this time but will continue to monitor.

## 2020-12-02 NOTE — Plan of Care (Signed)
Alert and oriented x 4. Denies pain or discomfort. Continues on IV fluids, tolerated well. No concerns voiced. Condom catheter in use with good output. Continue plan of care.  Problem: Safety: Goal: Non-violent Restraint(s) 12/02/2020 0645 by Yevonne Pax, RN Outcome: Progressing 12/02/2020 0645 by Yevonne Pax, RN Outcome: Progressing   Problem: Education: Goal: Knowledge of General Education information will improve Description: Including pain rating scale, medication(s)/side effects and non-pharmacologic comfort measures 12/02/2020 0645 by Yevonne Pax, RN Outcome: Progressing 12/02/2020 0645 by Yevonne Pax, RN Outcome: Progressing   Problem: Health Behavior/Discharge Planning: Goal: Ability to manage health-related needs will improve 12/02/2020 0645 by Yevonne Pax, RN Outcome: Progressing 12/02/2020 0645 by Yevonne Pax, RN Outcome: Progressing   Problem: Clinical Measurements: Goal: Ability to maintain clinical measurements within normal limits will improve 12/02/2020 0645 by Yevonne Pax, RN Outcome: Progressing 12/02/2020 0645 by Yevonne Pax, RN Outcome: Progressing Goal: Will remain free from infection 12/02/2020 0645 by Yevonne Pax, RN Outcome: Progressing 12/02/2020 0645 by Yevonne Pax, RN Outcome: Progressing Goal: Diagnostic test results will improve 12/02/2020 0645 by Yevonne Pax, RN Outcome: Progressing 12/02/2020 0645 by Yevonne Pax, RN Outcome: Progressing Goal: Respiratory complications will improve 12/02/2020 0645 by Yevonne Pax, RN Outcome: Progressing 12/02/2020 0645 by Yevonne Pax, RN Outcome: Progressing Goal: Cardiovascular complication will be avoided 12/02/2020 0645 by Yevonne Pax, RN Outcome: Progressing 12/02/2020 0645 by Yevonne Pax, RN Outcome: Progressing   Problem: Activity: Goal: Risk for activity intolerance will decrease 12/02/2020 0645 by Yevonne Pax, RN Outcome:  Progressing 12/02/2020 0645 by Yevonne Pax, RN Outcome: Progressing   Problem: Nutrition: Goal: Adequate nutrition will be maintained 12/02/2020 0645 by Yevonne Pax, RN Outcome: Progressing 12/02/2020 0645 by Yevonne Pax, RN Outcome: Progressing   Problem: Coping: Goal: Level of anxiety will decrease 12/02/2020 0645 by Yevonne Pax, RN Outcome: Progressing 12/02/2020 0645 by Yevonne Pax, RN Outcome: Progressing   Problem: Elimination: Goal: Will not experience complications related to bowel motility 12/02/2020 0645 by Yevonne Pax, RN Outcome: Progressing 12/02/2020 0645 by Yevonne Pax, RN Outcome: Progressing Goal: Will not experience complications related to urinary retention 12/02/2020 0645 by Yevonne Pax, RN Outcome: Progressing 12/02/2020 0645 by Yevonne Pax, RN Outcome: Progressing   Problem: Pain Managment: Goal: General experience of comfort will improve 12/02/2020 0645 by Yevonne Pax, RN Outcome: Progressing 12/02/2020 0645 by Yevonne Pax, RN Outcome: Progressing   Problem: Safety: Goal: Ability to remain free from injury will improve 12/02/2020 0645 by Yevonne Pax, RN Outcome: Progressing 12/02/2020 0645 by Yevonne Pax, RN Outcome: Progressing   Problem: Skin Integrity: Goal: Risk for impaired skin integrity will decrease 12/02/2020 0645 by Yevonne Pax, RN Outcome: Progressing 12/02/2020 0645 by Yevonne Pax, RN Outcome: Progressing

## 2020-12-02 NOTE — TOC Progression Note (Signed)
30 day  Note    Patient Details  Name: Steven Howard MRN: [id number] Date of Birth: 1951/06/23  Transition of Care [facility]) CM/SW Contact  Boneta Lucks, RN Phone Number: 12/02/2020, 4:09 PM  To Whom it may concern: Please be advised that the above name patient will require a short-term nursing home stay, anticipated 30 days or less rehabilitation and strengthening. The plan is for return home.   Expected Discharge Plan: Skilled Nursing Facility Barriers to Discharge: Barriers Resolved  Expected Discharge Plan and Services Expected Discharge Plan: [place]

## 2020-12-02 NOTE — Progress Notes (Signed)
Physical Therapy Treatment Patient Details Name: Steven Howard MRN: [id number] DOB: 1951/05/09 Today's Date: 12/02/2020    History of Present Illness Steven Howard  is a 70 y.o. male, with history of sleep apnea, glaucoma, COPD, cancer, presents the ED with a chief complaint of "breathing issues."  History is unclear, but this shortness of breath seems to be the reason that EMS was called out to the home.  When EMS arrived there is reported that he said he was not having any trouble breathing he was just drinking.  Family members had reported that his pulse ox was low, but was able to convince him to be transported by EMS to the hospital.  Patient was initially at his baseline and then had an abrupt change in mental status while with EMS.  Lection saturations dropped and they put him on a nonrebreather, patient became unresponsive.  When he arrived to the ED his oxygen saturations were in the 80s on a nonrebreather, patient had a GCS of 3.  Patient was intubated to protect his airway.  No further history could be obtained.    PT Comments    Pt agreeable to therapy today and actually requests to go to bathroom to attempt BM.  Pt required cues to transfer to EOB with some unsteadiness/dizziness upon initial sitting.  Cues to push with UE's to stand from bed and ambulated using RW to and from bathroom.  Pt required min assist to complete hygiene.  Returned to bed with cues but without physical assist and able to position himself correctly in bed.  Pt with noted fatigue due to weakness but without drop in pulse oxygenation.     Follow Up Recommendations  SNF;Supervision for mobility/OOB;Supervision - Intermittent     Equipment Recommendations       Recommendations for Other Services       Precautions / Restrictions Precautions Precautions: Fall Precaution Comments: pt usually ambulates with bilatera knee braces due to knee weakness Restrictions Weight Bearing Restrictions: No    Mobility   Bed Mobility Overal bed mobility: Needs Assistance Bed Mobility: Supine to Sit     Supine to sit: Supervision     General bed mobility comments: increased time, labored movement, cues for energy conservation    Transfers Overall transfer level: Needs assistance Equipment used: Rolling walker (2 wheeled) Transfers: Sit to/from Stand Sit to Stand: Min guard         General transfer comment: Transfer safety as tends to pull up rather than push with his UE's, noted weakness upon standing  Ambulation/Gait Ambulation/Gait assistance: Min assist Gait Distance (Feet): 20 Feet Assistive device: Rolling walker (2 wheeled) Gait Pattern/deviations: Decreased step length - right;Decreased step length - left;Decreased stride length;Wide base of support     General Gait Details: limited ambulation in room; transfer to commode to have BM.  Weakness, unsteadiness needing RW to maintain.  Fatigue but no drop in oxygen on 1 liter        Cognition Arousal/Alertness: Awake/alert Behavior During Therapy: WFL for tasks assessed/performed Overall Cognitive Status: Within Functional Limits for tasks assessed                                                      Pertinent Vitals/Pain Pain Assessment: No/denies pain           PT Goals (  current goals can now be found in the care plan section) Progress towards PT goals: Progressing toward goals    Frequency    Min 3X/week      PT Plan Current plan remains appropriate       AM-PAC PT "6 Clicks" Mobility   Outcome Measure  Help needed turning from your back to your side while in a flat bed without using bedrails?: None Help needed moving from lying on your back to sitting on the side of a flat bed without using bedrails?: A Little Help needed moving to and from a bed to a chair (including a wheelchair)?: A Little Help needed standing up from a chair using your arms (e.g., wheelchair or bedside chair)?: A  Little Help needed to walk in hospital room?: A Little Help needed climbing 3-5 steps with a railing? : A Lot 6 Click Score: 18    End of Session Equipment Utilized During Treatment: Gait belt;Oxygen Activity Tolerance: Patient limited by fatigue Patient left: in bed;with call bell/phone within reach   PT Visit Diagnosis: Unsteadiness on feet (R26.81);Other abnormalities of gait and mobility (R26.89);Muscle weakness (generalized) (M62.81)     Time: 4103-0131 PT Time Calculation (min) (ACUTE ONLY): 17 min  Charges:  $Therapeutic Activity: 8-22 mins                     Teena Irani, PTA/CLT [place], Raesha Coonrod B 12/02/2020, 4:56 PM

## 2020-12-02 NOTE — Discharge Summary (Signed)
Physician Discharge Summary  Steven Howard ZHG:[id number] DOB: [birth year] DOA: 11/26/2020  PCP: Claretta Fraise, MD  Admit date: 11/26/2020 Discharge date: 12/02/2020  Time spent: 35 minutes  Recommendations for Outpatient Follow-up:  1. Repeat chest x-ray in 6-8 weeks to assure complete resolution of infiltrates. 2. Repeat basic metabolic panel in 1 week to follow electrolytes and renal function 3. Physical therapy and rehabilitation as per the skilled nursing facility protocol. 4. Continue assisting patient with tobacco and alcohol cessation. 5. Wean off oxygen supplementation as tolerated.   Discharge Diagnoses:  Active Problems:   Acute lower UTI   OSA (obstructive sleep apnea)   Hyponatremia   Hypotension   Respiratory failure with hypoxia and hypercapnia (HCC)   Lactic acidosis   Hypochloremia   Community acquired pneumonia   Acute metabolic encephalopathy   Alcoholic intoxication with complication ([place])   Discharge Condition: Stable and improved.  Discharged to skilled nursing facility for further care and rehabilitation.   Code status: Full code  Diet recommendation: Heart healthy diet.  Filed Weights   11/26/20 1743  Weight: 90.7 kg    History of present illness:  As per H&P written by Dr. Clearence Ped on 11/27/20 [name] a54 y.o.male,with history of sleep apnea, glaucoma, COPD, cancer, presents the ED with a chief complaint of "breathing issues."History is unclear, but this shortness of breath seems to be the reason that EMS was called out to the home. When EMS arrived there is reported that he said he was not having any trouble breathing he was just drinking. Family members had reported that his pulse ox was low, but was able to convince him to be transported by EMS to the hospital. Patient was initially at his baseline and then had an abrupt change in mental status while with EMS. Lection saturations dropped and they put him on a nonrebreather,  patient became unresponsive. When he arrived to the ED his oxygen saturations were in the 80s on a nonrebreather, patient had a GCS of 3. Patient was intubated to protect his airway. No further history could be obtained.  In the ED Temp 96.7, heart rate 71-119at time of admission heart rate is 72, blood pressure as low as systolic 19Q over 22W this is likely secondary to propofol as blood pressure normalized when propofol was switched to Precedex, respiratory rate 15-24, O2 saturations 84% to 100% Chemistry panel shows hyponatremia at 132, hypochloremia at 90 Patient was given 1 L of normal saline, 1 L of LR, 500 of normal saline Patient was given fentanyl, Versed, propofol, succinylcholine for intubation sedating purposes After patient was intubated he became more responsive and was trying to pull out his tubes who is put in nonviolent restraints.  Lactic acid was initially elevated at 2.6, and then repeat 10.6 Alcohol level was 365 UDS negative Initial ABG showed a pH of 7.3 and a hypercapnia at 57 Repeat ABG showed pH of 7.45, PCO2 of 39 Admission requested for further work-up of altered mental status and respiratory failure  Hospital Course:  1-acute hypoxic and hypercapnic respiratory failure -In the setting of alcohol intoxication and lack of CPAP usage. -Extubated and with good oxygen saturation on 2 L nasal cannula supplementation. -Assess oxygen desaturation screening. -Continue to wean of oxygen supplementation as tolerated. -Continue CPAP nightly; education provided about importance of compliance. -Continue home inhalers/bronchodilators -Continue to use flutter valve  2-history of COPD -Currently no wheezing -Continue as needed bronchodilators -Continue the use of Breo Ellipta as part of maintenance therapy.  3-alcohol  abuse/intoxication/withdrawal -Currently off Precedex -No active withdrawal symptoms appreciated -Completed Librium therapy. -Continue thiamine  and folic acid -Cessation counseling provided.  4-left lower lobe pneumonia -Continue the use of flutter valve -Transition antibiotics to cefdinir orally -Continue supportive care. -Continue to wean oxygen supplementation as tolerated.  At discharge using 1 L nasal cannula.  5-dehydration/hyponatremia/hypochloremia -In the setting of poor oral intake and alcohol abuse -Continue to maintain adequate hydration -Now tolerating diet. -Electrolytes within normal limits -Repeat basic metabolic panel follow-up visit.  6-acute metabolic encephalopathy in the setting of hypercapnia and alcohol intoxication/withdrawal -Improved; oriented x3 currently  7-physical deconditioning -Patient seen by physical therapy with recommendation for a skilled nursing facility short-term rehab -Patient in agreement; continue avoiding bed availability/concern catheterization. -Patient will be discharge to [facility] in [place] for further rehabilitation and care.  8-restless night/insomnia -Will continue using melatonin nightly  9-back skin lesions -Maintain area clean and dry -will treat with bactroban   Procedures:  See below for x-ray reports.  Consultations:  Pulmonology initially curbside when patient presented in need of transient intubation.   Discharge Exam: Vitals:   12/02/20 0505 12/02/20 0727  BP: (!) 130/92   Pulse: 98   Resp: 18   Temp: 97.8 F (36.6 C)   SpO2: 97% 92%    General exam: Alert, awake, oriented x 3; reports having trouble with hospital CPAP machine; no chest pain, no nausea, no vomiting.  No active control currently appreciated.  Still feeling weak, off balance and deconditioned. Respiratory system: Positive rhonchi bilaterally; requiring 1L nasal cannula supplementation.  No wheezing appreciated on exam.  No using accessory muscle. Cardiovascular system:RRR.  No rubs, no gallops, no JVD.  No murmurs appreciated on exam. Gastrointestinal system: Abdomen is  nondistended, soft and nontender. No organomegaly or masses felt. Normal bowel sounds heard. Central nervous system: Alert and oriented. No focal neurological deficits. Extremities: No cyanosis or clubbing. Skin: No petechiae. Patient with diffuse skin lesions (resembling impetigo and folliculitis) in his back. Lesions are currently dry and without active drainage. Psychiatry: Judgement and insight appear normal. Mood & affect appropriate.    Discharge Instructions   Discharge Instructions    Diet - low sodium heart healthy   Complete by: As directed    Discharge instructions   Complete by: As directed    Heart healthy diet Maintain adequate hydration Repeat basic metabolic panel in the following 3 months normal function Take medications as prescribed and make sure that antibiotics are provided with food to minimize GI upset symptoms. Follow-up with PCP in 2 weeks after discharge from the skilled nursing facility. Physical therapy and conditioning as per the skilled nursing facility rehab protocol.     Allergies as of 12/02/2020   No Known Allergies     Medication List    STOP taking these medications   docusate sodium 100 MG capsule Commonly known as: COLACE   polyethylene glycol 17 g packet Commonly known as: MIRALAX / GLYCOLAX   sulfamethoxazole-trimethoprim 800-160 MG tablet Commonly known as: Bactrim DS     TAKE these medications   albuterol 108 (90 Base) MCG/ACT inhaler Commonly known as: VENTOLIN HFA Inhale 2 puffs into the lungs every 4 (four) hours as needed for wheezing or shortness of breath (cough, shortness of breath or wheezing.).   Breo Ellipta 100-25 MCG/INH Aepb Generic drug: fluticasone furoate-vilanterol INHALE 1 PUFF INTO LUNGS DAILY   cefdinir 300 MG capsule Commonly known as: OMNICEF Take 1 capsule (300 mg total) by mouth every 12 (twelve)  hours for 3 days.   folic acid 1 MG tablet Commonly known as: FOLVITE Take 1 tablet (1 mg total) by  mouth daily. Start taking on: December 03, 2020   furosemide 40 MG tablet Commonly known as: LASIX Take 1 tablet (40 mg total) by mouth daily.   ipratropium-albuterol 0.5-2.5 (3) MG/3ML Soln Commonly known as: DUONEB Take 3 mLs by nebulization every 4 (four) hours as needed.   melatonin 3 MG Tabs tablet Take 1 tablet (3 mg total) by mouth at bedtime.   metoprolol tartrate 25 MG tablet Commonly known as: LOPRESSOR Take 0.5 tablets (12.5 mg total) by mouth 2 (two) times daily.   multivitamin with minerals Tabs tablet Take 1 tablet by mouth daily.   mupirocin cream 2 % Commonly known as: Bactroban Apply to affected area on his back 3 times daily   nicotine 21 mg/24hr patch Commonly known as: NICODERM CQ - dosed in mg/24 hours Place 1 patch (21 mg total) onto the skin daily as needed (nicotine cravings).   potassium chloride SA 20 MEQ tablet Commonly known as: KLOR-CON Take 1 tablet (20 mEq total) by mouth daily. Start taking on: December 03, 2020   saccharomyces boulardii 250 MG capsule Commonly known as: Florastor Take 1 capsule (250 mg total) by mouth 2 (two) times daily.   thiamine 100 MG tablet Take 1 tablet (100 mg total) by mouth daily. Start taking on: December 03, 2020      No Known Allergies  Contact information for follow-up providers    Claretta Fraise, MD. Schedule an appointment as soon as possible for a visit in 2 week(s).   Specialty: Family Medicine Why: after Discharge from the skilled nursing facility. Contact information: [place] [address] [phone]        Minus Breeding, MD .   Specialty: Cardiology Contact information: [place] [address] [phone]            Contact information for after-discharge care    [facility] .   Service: Skilled Nursing Contact information: [address] [place] [phone]                  The  results of significant diagnostics from this hospitalization (including imaging, microbiology, ancillary and laboratory) are listed below for reference.    Significant Diagnostic Studies: DG Abdomen 1 View  Result Date: 11/27/2020 CLINICAL DATA:  Check gastric catheter placement EXAM: ABDOMEN - 1 VIEW COMPARISON:  None. FINDINGS: Gastric catheter is noted within the stomach. Scattered large and small bowel gas is noted. IMPRESSION: Gastric catheter in the stomach. Electronically Signed   By: Inez Catalina M.D.   On: 11/27/2020 03:35   CT HEAD WO CONTRAST  Result Date: 11/26/2020 CLINICAL DATA:  Focal neural deficit greater than 6 hours, stroke suspected. Responsive, intubated. History of prostate cancer. EXAM: CT HEAD WITHOUT CONTRAST TECHNIQUE: Contiguous axial images were obtained from the base of the skull through the vertex without intravenous contrast. COMPARISON:  CT head 05/25/2020, MRI head 05/25/2019 FINDINGS: Brain: Cerebral ventricle sizes are concordant with the degree of cerebral volume loss. Patchy and confluent areas of decreased attenuation are noted throughout the deep and periventricular white matter of the cerebral hemispheres bilaterally, compatible with chronic microvascular ischemic disease. No evidence of large-territorial acute infarction. No parenchymal hemorrhage. No mass lesion. No extra-axial collection. No mass effect or midline shift. No hydrocephalus. Basilar cisterns are  patent. Vascular: No hyperdense vessel. Atherosclerotic calcifications are present within the cavernous internal carotid arteries. 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 Skull: No acute fracture or focal lesion. Sinuses/Orbits: Mucosal thickening of the left sphenoid sinus. Mucosal thickening of bilateral maxillary sinuses. Paranasal sinuses and mastoid air cells are clear. The orbits  are unremarkable. Other: Partially visualized endotracheal tube. Question coiling of enteric tube within the mouth and neck on scout view. IMPRESSION: 1. No acute intracranial abnormality. 2. Question coiling of enteric tube within the mouth and neck on scout view. Electronically Signed   By: Iven Finn M.D.   On: 11/26/2020 19:07   DG CHEST PORT 1 VIEW  Result Date: 11/27/2020 CLINICAL DATA:  Respiratory failure EXAM: PORTABLE CHEST 1 VIEW COMPARISON:  11/26/2020 FINDINGS: Nasogastric tube is been advanced into the upper abdomen beyond the margin of the examination. Endotracheal tube tip is seen 2.2 cm above the carina. Mild diffuse thickening of the pulmonary interstitium is again seen. No confluent pulmonary infiltrate. No pneumothorax or pleural effusion. Cardiac size within normal limits. Pulmonary vascularity is normal. No acute bone abnormality. IMPRESSION: Support tubes in appropriate position. No focal pulmonary infiltrate. Chronic pulmonary interstitial changes. Electronically Signed   By: Fidela Salisbury MD   On: 11/27/2020 03:37   DG Chest Portable 1 View  Result Date: 11/26/2020 CLINICAL DATA:  Intubated EXAM: PORTABLE CHEST 1 VIEW COMPARISON:  06/05/2020 FINDINGS: Magdalene Molly is poorly visible. Esophageal tube tip appears to be near or at the carina. Additional tubing tip at the superior margin of clavicles, possible esophageal tube. Cardiomegaly with vascular congestion. Patchy atelectasis or minimal infiltrate left base. Aortic atherosclerosis. No pneumothorax. IMPRESSION: 1. Cardiomegaly with vascular congestion. Patchy atelectasis or minimal infiltrate at the left base. 2. Poorly visible carina, suspect that endotracheal tube is at or near the carina 3. Additional tubing tip at superior margin of clavicles, question esophageal tube; if there is esophageal tube placement, this should be repositioned/advanced. Electronically Signed   By: Donavan Foil M.D.   On: 11/26/2020 18:39   CT Angio  Abd/Pel w/ and/or w/o  Result Date: 11/27/2020 CLINICAL DATA:  Acute mesenteric ischemia, lactic acidosis EXAM: CTA ABDOMEN AND PELVIS WITHOUT AND WITH CONTRAST TECHNIQUE: Multidetector CT imaging of the abdomen and pelvis was performed using the standard protocol during bolus administration of intravenous contrast. Multiplanar reconstructed images and MIPs were obtained and reviewed to evaluate the vascular anatomy. CONTRAST:  162mL OMNIPAQUE IOHEXOL 300 MG/ML  SOLN COMPARISON:  None. FINDINGS: VASCULAR Aorta: Normal caliber. No aneurysm or dissection. Extensive aortic atherosclerotic calcification. No periaortic inflammatory change identified. Celiac: Widely patent. No aneurysm or dissection. Classic anatomic configuration. SMA: Widely patent.  No aneurysm or dissection. Renals: Single renal arteries bilaterally. Less than 50% stenosis of the right renal artery at its origin. Wide patency of the left renal artery. Normal vascular morphology. No aneurysm. IMA: Widely patent. Inflow: Extensive atherosclerotic calcification. No evidence of hemodynamically significant stenosis. No aneurysm or dissection. Internal iliac arteries are patent bilaterally. Proximal Outflow: Extensive atherosclerotic calcification results in 50% stenosis of the left common femoral artery. Extensive calcification noted within the visualized superficial femoral arteries bilaterally. Veins: Unremarkable Review of the MIP images confirms the above findings. NON-VASCULAR Lower chest: Tiny left pleural effusion. Mild bibasilar atelectasis. Moderate right coronary artery calcification. Global cardiac size is within normal limits. Hepatobiliary: Mild hepatic steatosis. No enhancing liver lesion. Gallbladder unremarkable. No intra or extrahepatic  biliary ductal dilation. Pancreas: Unremarkable Spleen: Unremarkable Adrenals/Urinary Tract: The adrenal glands are unremarkable. The kidneys are normal. Mild bilateral distal hydroureter. The bladder is  circumferentially markedly thickened and there is moderate perivesicular inflammatory stranding in keeping with changes of infectious or inflammatory cystitis. The bladder is not distended. A Foley catheter balloon is seen inflated within the prostatic urethra. Stomach/Bowel: Moderate left inguinal hernia contains a single loop of unremarkable sigmoid colon. The stomach, small bowel, and large bowel are otherwise unremarkable. Appendix normal. 20 mm nodule within the a mesentery within the left lower quadrant adjacent to the descending colon likely represents a remote omental infarct. No free intraperitoneal gas or fluid. Lymphatic: No pathologic adenopathy within the abdomen and pelvis. Reproductive: Brachytherapy seeds are seen within the prostate gland. Seminal vesicles are unremarkable. There is calcification of the vas deferens bilaterally. Other: Moderate presacral and retroperitoneal edema is present, possibly secondary to the inflammatory process within the pelvis. No discrete drainable fluid collection identified. Moderate stool within the rectum. Musculoskeletal: No lytic or blastic bone lesion. No acute bone abnormality. IMPRESSION: VASCULAR Wide patency of the mesenteric arterial and venous vasculature. No evidence of differential bowel perfusion. Extensive aortoiliac atherosclerotic calcification with less than 50% stenosis of the right renal artery origin. Extensive disease of the visualized lower extremity arterial outflow with 50% stenosis of the left common femoral artery. NON-VASCULAR Foley catheter balloon inflated within the prostatic urethra. Marked circumferential bladder wall thickening and extensive perivesicular inflammatory stranding in keeping with infectious or inflammatory cystitis. The bladder is not distended. Moderate retroperitoneal edema within the pelvis may be reactive in nature. Moderate left inguinal hernia containing and unremarkable loop of sigmoid colon Mild hepatic steatosis  Aortic Atherosclerosis (ICD10-I70.0). Electronically Signed   By: Fidela Salisbury MD   On: 11/27/2020 02:08    Microbiology: Recent Results (from the past 240 hour(s))  Resp Panel by RT-PCR (Flu A&B, Covid) Nasopharyngeal Swab     Status: None   Collection Time: 11/26/20  5:56 PM   Specimen: Nasopharyngeal Swab; Nasopharyngeal(NP) swabs in vial transport medium  Result Value Ref Range Status   SARS Coronavirus 2 by RT PCR NEGATIVE NEGATIVE Final    Comment: (NOTE) SARS-CoV-2 target nucleic acids are NOT DETECTED.  The SARS-CoV-2 RNA is generally detectable in upper respiratory specimens during the acute phase of infection. The lowest concentration of SARS-CoV-2 viral copies this assay can detect is 138 copies/mL. A negative result does not preclude SARS-Cov-2 infection and should not be used as the sole basis for treatment or other patient management decisions. A negative result may occur with  improper specimen collection/handling, submission of specimen other than nasopharyngeal swab, presence of viral mutation(s) within the areas targeted by this assay, and inadequate number of viral copies(<138 copies/mL). A negative result must be combined with clinical observations, patient history, and epidemiological information. The expected result is Negative.  Fact Sheet for Patients:  EntrepreneurPulse.com.au  Fact Sheet for Healthcare Providers:  IncredibleEmployment.be  This test is no t yet approved or cleared by the [place] FDA and  has been authorized for detection and/or diagnosis of SARS-CoV-2 by FDA under an Emergency Use Authorization (EUA). This EUA will remain  in effect (meaning this test can be used) for the duration of the COVID-19 declaration under Section 564(b)(1) of the Act, 21 U.S.C.section 360bbb-3(b)(1), unless the authorization is terminated  or revoked sooner.       Influenza A by PCR NEGATIVE NEGATIVE Final    Influenza B by PCR NEGATIVE  NEGATIVE Final    Comment: (NOTE) The Xpert Xpress SARS-CoV-2/FLU/RSV plus assay is intended as an aid in the diagnosis of influenza from Nasopharyngeal swab specimens and should not be used as a sole basis for treatment. Nasal washings and aspirates are unacceptable for Xpert Xpress SARS-CoV-2/FLU/RSV testing.  Fact Sheet for Patients: EntrepreneurPulse.com.au  Fact Sheet for Healthcare Providers: IncredibleEmployment.be  This test is not yet approved or cleared by the [place] FDA and has been authorized for detection and/or diagnosis of SARS-CoV-2 by FDA under an Emergency Use Authorization (EUA). This EUA will remain in effect (meaning this test can be used) for the duration of the COVID-19 declaration under Section 564(b)(1) of the Act, 21 U.S.C. section 360bbb-3(b)(1), unless the authorization is terminated or revoked.  Performed at [facility], [address] [place], [place] [address]   Urine Culture     Status: Abnormal   Collection Time: 11/26/20  7:41 PM   Specimen: Urine, Catheterized  Result Value Ref Range Status   Specimen Description   Final    Urine Performed at [facility], [address], [place], [place] [address]    Special Requests   Final    NONE Performed at [facility], [address] [place], [place] [address]    Culture MULTIPLE SPECIES PRESENT, SUGGEST RECOLLECTION (A)  Final   Report Status 11/28/2020 FINAL  Final  Blood culture (routine x 2)     Status: None   Collection Time: 11/26/20  7:41 PM   Specimen: BLOOD  Result Value Ref Range Status   Specimen Description BLOOD RIGHT ANTECUBITAL  Final   Special Requests   Final    BOTTLES DRAWN AEROBIC AND ANAEROBIC Blood Culture adequate volume   Culture   Final    NO GROWTH 5 DAYS Performed at [facility], [address], [place], [place] [address]    Report Status 12/01/2020 FINAL  Final  Blood culture (routine x 2)      Status: None   Collection Time: 11/26/20  8:38 PM   Specimen: Left Antecubital; Blood  Result Value Ref Range Status   Specimen Description LEFT ANTECUBITAL  Final   Special Requests   Final    BOTTLES DRAWN AEROBIC AND ANAEROBIC Blood Culture adequate volume   Culture   Final    NO GROWTH 5 DAYS Performed at [facility], [address] [place], [place] [address]    Report Status 12/01/2020 FINAL  Final  MRSA PCR Screening     Status: None   Collection Time: 11/27/20 12:04 AM   Specimen: Nasal Mucosa; Nasopharyngeal  Result Value Ref Range Status   MRSA by PCR NEGATIVE NEGATIVE Final    Comment:        The GeneXpert MRSA Assay (FDA approved for NASAL specimens only), is one component of a comprehensive MRSA colonization surveillance program. It is not intended to diagnose MRSA infection nor to guide or monitor treatment for MRSA infections. Performed at [facility], [address] [place], [place] [address]   Culture, Respiratory w Gram Stain     Status: None   Collection Time: 11/27/20  3:11 AM   Specimen: Tracheal Aspirate  Result Value Ref Range Status   Specimen Description   Final    TRACHEAL ASPIRATE Performed at [facility], [address], [place], [place] [address]    Special Requests   Final    NONE Performed at [facility], [address] [place], [place] [address]    Gram Stain  Final    ABUNDANT WBC PRESENT,BOTH PMN AND MONONUCLEAR FEW GRAM POSITIVE COCCI    Culture   Final    FEW Normal respiratory flora-no Staph aureus or Pseudomonas seen Performed at [facility], [address], [place], [place] [address]    Report Status 11/29/2020 FINAL  Final  Resp Panel by RT-PCR (Flu A&B, Covid) Nasopharyngeal Swab     Status: None   Collection Time: 12/02/20 11:13 AM   Specimen: Nasopharyngeal Swab; Nasopharyngeal(NP) swabs in vial transport medium  Result Value Ref Range Status   SARS Coronavirus 2 by RT PCR NEGATIVE NEGATIVE Final    Comment:  (NOTE) SARS-CoV-2 target nucleic acids are NOT DETECTED.  The SARS-CoV-2 RNA is generally detectable in upper respiratory specimens during the acute phase of infection. The lowest concentration of SARS-CoV-2 viral copies this assay can detect is 138 copies/mL. A negative result does not preclude SARS-Cov-2 infection and should not be used as the sole basis for treatment or other patient management decisions. A negative result may occur with  improper specimen collection/handling, submission of specimen other than nasopharyngeal swab, presence of viral mutation(s) within the areas targeted by this assay, and inadequate number of viral copies(<138 copies/mL). A negative result must be combined with clinical observations, patient history, and epidemiological information. The expected result is Negative.  Fact Sheet for Patients:  EntrepreneurPulse.com.au  Fact Sheet for Healthcare Providers:  IncredibleEmployment.be  This test is no t yet approved or cleared by the [place] FDA and  has been authorized for detection and/or diagnosis of SARS-CoV-2 by FDA under an Emergency Use Authorization (EUA). This EUA will remain  in effect (meaning this test can be used) for the duration of the COVID-19 declaration under Section 564(b)(1) of the Act, 21 U.S.C.section 360bbb-3(b)(1), unless the authorization is terminated  or revoked sooner.       Influenza A by PCR NEGATIVE NEGATIVE Final   Influenza B by PCR NEGATIVE NEGATIVE Final    Comment: (NOTE) The Xpert Xpress SARS-CoV-2/FLU/RSV plus assay is intended as an aid in the diagnosis of influenza from Nasopharyngeal swab specimens and should not be used as a sole basis for treatment. Nasal washings and aspirates are unacceptable for Xpert Xpress SARS-CoV-2/FLU/RSV testing.  Fact Sheet for Patients: EntrepreneurPulse.com.au  Fact Sheet for Healthcare  Providers: IncredibleEmployment.be  This test is not yet approved or cleared by the [place] FDA and has been authorized for detection and/or diagnosis of SARS-CoV-2 by FDA under an Emergency Use Authorization (EUA). This EUA will remain in effect (meaning this test can be used) for the duration of the COVID-19 declaration under Section 564(b)(1) of the Act, 21 U.S.C. section 360bbb-3(b)(1), unless the authorization is terminated or revoked.  Performed at [facility], [address], [place],  [address]      Labs: Basic Metabolic Panel: Recent Labs  Lab 11/26/20 1740 11/27/20 0616 11/28/20 1903 12/01/20 0429  NA 132* 136 133* 136  K 4.1 4.0 3.3* 3.1*  CL 90* 98 99 96*  CO2 30 24 27 31   GLUCOSE 108* 67* 139* 83  BUN 13 9 5* 5*  CREATININE 0.64 0.52* 0.54* 0.45*  CALCIUM 8.3* 7.9* 8.1* 8.4*  MG  --  1.5* 1.4*  --   PHOS  --  1.9* 1.5*  --    Liver Function Tests: Recent Labs  Lab 11/26/20 1740 11/27/20 0616 11/28/20 1903  AST 26 21 19   ALT 13 12 9   ALKPHOS 88 81 72  BILITOT 0.3  0.9 0.9  PROT 6.7 5.9* 5.6*  ALBUMIN 3.3* 2.7* 2.5*   CBC: Recent Labs  Lab 11/26/20 1740 11/27/20 0616 11/28/20 1903 12/01/20 0429  WBC 9.3 9.1 6.4 6.1  NEUTROABS 3.6 5.9  --   --   HGB 17.5* 17.1* 15.8 16.6  HCT 51.6 50.5 47.1 50.0  MCV 100.8* 100.6* 100.9* 101.8*  PLT 189 139* 142* 144*   BNP: BNP (last 3 results) Recent Labs    05/25/20 0830 06/06/20 0722  BNP 455.0* 340.0*   CBG: Recent Labs  Lab 11/26/20 1742 11/27/20 0739 11/27/20 1107 11/27/20 1936 11/28/20 1540  GLUCAP 135* 75 87 213* 83    Signed:  Barton Dubois MD.  [facility] 12/02/2020, 2:11 PM

## 2020-12-02 NOTE — TOC Transition Note (Addendum)
Transition of Care [facility]) - CM/SW Discharge Note   Patient Details  Name: Steven Howard MRN: [id number] Date of Birth: Feb 08, 1951  Transition [facility]) CM/SW Contact:  Boneta Lucks, RN Phone Number: 12/02/2020, 12:45 PM   Clinical Narrative:   Patient is medically ready to go to [facility]. [name] provided number for report. [name] is still waiting on INS AUTH, we are providing a LOG for discharge.  [name] updated TOC that [place] Must need uploaded documents. TOC sending requested documents now. Medical necessity printed and TOC will call EMS when RN is ready.   Addendum [name] putting a hold on Discharge. The LOG does not have rated included and needs a signature line for their administrator. Also [facility] is requesting updated PT note. PT will send someone to assess, it will be after 4:30.  wanting 30 day note, TOC to follow [facility] for PASSR.   Final next level of care: Skilled Nursing Facility Barriers to Discharge: Barriers Resolved   Patient Goals and CMS Choice Patient states their goals for this hospitalization and ongoing recovery are:: to go to SNF CMS Medicare.gov Compare Post Acute Care list provided to:: Patient Represenative (must comment) Choice offered to / list presented to : Sibling  Discharge Placement              Patient chooses bed at:  [facility]) Patient to be transferred to facility by: EMS   Patient and family notified of of transfer: 12/02/20  Discharge Plan and Services        Readmission Risk Interventions Readmission Risk Prevention Plan 12/02/2020 12/01/2020 06/07/2020  Transportation Screening Complete Complete Complete  PCP or Specialist Appt within 5-7 Days - - Complete  PCP or Specialist Appt within 3-5 Days Complete - -  Home Care Screening - - Complete  Medication Review (RN CM) - - Complete  HRI or Home Care Consult Complete Complete -  Social Work Consult for [place] Planning/Counseling Complete Complete -  Palliative Care Screening Not  Applicable Not Applicable -  Medication Review Press photographer) Complete Complete -  Some recent data might be hidden

## 2020-12-03 DIAGNOSIS — J189 Pneumonia, unspecified organism: Secondary | ICD-10-CM | POA: Diagnosis not present

## 2020-12-03 DIAGNOSIS — J449 Chronic obstructive pulmonary disease, unspecified: Secondary | ICD-10-CM | POA: Diagnosis not present

## 2020-12-03 DIAGNOSIS — R262 Difficulty in walking, not elsewhere classified: Secondary | ICD-10-CM | POA: Diagnosis not present

## 2020-12-03 DIAGNOSIS — M6281 Muscle weakness (generalized): Secondary | ICD-10-CM | POA: Diagnosis not present

## 2020-12-03 DIAGNOSIS — E871 Hypo-osmolality and hyponatremia: Secondary | ICD-10-CM | POA: Diagnosis not present

## 2020-12-03 DIAGNOSIS — G4733 Obstructive sleep apnea (adult) (pediatric): Secondary | ICD-10-CM | POA: Diagnosis not present

## 2020-12-03 DIAGNOSIS — G9341 Metabolic encephalopathy: Secondary | ICD-10-CM

## 2020-12-03 DIAGNOSIS — F10129 Alcohol abuse with intoxication, unspecified: Secondary | ICD-10-CM | POA: Diagnosis not present

## 2020-12-03 DIAGNOSIS — I5031 Acute diastolic (congestive) heart failure: Secondary | ICD-10-CM | POA: Diagnosis not present

## 2020-12-03 DIAGNOSIS — Z7289 Other problems related to lifestyle: Secondary | ICD-10-CM | POA: Diagnosis not present

## 2020-12-03 DIAGNOSIS — J9691 Respiratory failure, unspecified with hypoxia: Secondary | ICD-10-CM | POA: Diagnosis not present

## 2020-12-03 DIAGNOSIS — J441 Chronic obstructive pulmonary disease with (acute) exacerbation: Secondary | ICD-10-CM | POA: Diagnosis not present

## 2020-12-03 DIAGNOSIS — Z7401 Bed confinement status: Secondary | ICD-10-CM | POA: Diagnosis not present

## 2020-12-03 DIAGNOSIS — G4700 Insomnia, unspecified: Secondary | ICD-10-CM | POA: Diagnosis not present

## 2020-12-03 DIAGNOSIS — R5381 Other malaise: Secondary | ICD-10-CM | POA: Diagnosis not present

## 2020-12-03 DIAGNOSIS — J9692 Respiratory failure, unspecified with hypercapnia: Secondary | ICD-10-CM | POA: Diagnosis not present

## 2020-12-03 DIAGNOSIS — R69 Illness, unspecified: Secondary | ICD-10-CM | POA: Diagnosis not present

## 2020-12-03 DIAGNOSIS — J9601 Acute respiratory failure with hypoxia: Secondary | ICD-10-CM | POA: Diagnosis not present

## 2020-12-03 NOTE — Plan of Care (Signed)

## 2020-12-03 NOTE — TOC Progression Note (Addendum)
Transition of Care [facility]) - Progression Note    Patient Details  Name: EVART MCDONNELL MRN: [id number] Date of Birth: [birth year]  Transition of Care [facility]) CM/SW Contact  Salome Arnt, [place] Phone Number: 12/03/2020, 11:28 AM  Clinical Narrative:   LCSW sent updated PT notes to [facility]. [place] evaluation to be completed over phone with pt at 3:30 this afternoon. Pt states he has his CPAP with him from home that he can take to SNF. TOC to follow.     Expected Discharge Plan: Skilled Nursing Facility Barriers to Discharge: Barriers Resolved  Expected Discharge Plan and Services Expected Discharge Plan: [place]         Expected Discharge Date: 12/02/20                                     Social Determinants of Health (SDOH) Interventions    Readmission Risk Interventions Readmission Risk Prevention Plan 12/02/2020 12/01/2020 06/07/2020  Transportation Screening Complete Complete Complete  PCP or Specialist Appt within 5-7 Days - - Complete  PCP or Specialist Appt within 3-5 Days Complete - -  Home Care Screening - - Complete  Medication Review (RN CM) - - Complete  HRI or Home Care Consult Complete Complete -  Social Work Consult for [place] Planning/Counseling Complete Complete -  Palliative Care Screening Not Applicable Not Applicable -  Medication Review Press photographer) Complete Complete -  Some recent data might be hidden

## 2020-12-03 NOTE — Progress Notes (Signed)
Pt discharged via stretcher with [facility] to [place]. Pt belongings given to EMS for transport, including CPAP machine & clothing. Pt had cellphone with him.

## 2020-12-03 NOTE — Progress Notes (Signed)
Patient seen and evaluated at bedside on day of discharge with no acute concerns or complaints noted.  Please refer to discharge summary on 12/02/2020.  No new changes or acute events noted since DC summary completed.  Total care time: 20 minutes.

## 2020-12-04 ENCOUNTER — Ambulatory Visit: Payer: Medicare HMO | Admitting: Family Medicine

## 2020-12-07 DIAGNOSIS — G4733 Obstructive sleep apnea (adult) (pediatric): Secondary | ICD-10-CM | POA: Diagnosis not present

## 2020-12-07 DIAGNOSIS — G9341 Metabolic encephalopathy: Secondary | ICD-10-CM | POA: Diagnosis not present

## 2020-12-07 DIAGNOSIS — J9691 Respiratory failure, unspecified with hypoxia: Secondary | ICD-10-CM | POA: Diagnosis not present

## 2020-12-07 DIAGNOSIS — J9692 Respiratory failure, unspecified with hypercapnia: Secondary | ICD-10-CM | POA: Diagnosis not present

## 2020-12-07 DIAGNOSIS — J189 Pneumonia, unspecified organism: Secondary | ICD-10-CM | POA: Diagnosis not present

## 2020-12-07 DIAGNOSIS — E871 Hypo-osmolality and hyponatremia: Secondary | ICD-10-CM | POA: Diagnosis not present

## 2020-12-07 DIAGNOSIS — Z7289 Other problems related to lifestyle: Secondary | ICD-10-CM | POA: Diagnosis not present

## 2020-12-07 DIAGNOSIS — J449 Chronic obstructive pulmonary disease, unspecified: Secondary | ICD-10-CM | POA: Diagnosis not present

## 2020-12-09 ENCOUNTER — Ambulatory Visit: Payer: Medicare HMO | Admitting: Family Medicine

## 2020-12-18 ENCOUNTER — Encounter: Payer: Self-pay | Admitting: Family Medicine

## 2020-12-22 DIAGNOSIS — I951 Orthostatic hypotension: Secondary | ICD-10-CM | POA: Diagnosis not present

## 2020-12-22 DIAGNOSIS — H409 Unspecified glaucoma: Secondary | ICD-10-CM | POA: Diagnosis not present

## 2020-12-22 DIAGNOSIS — F32A Depression, unspecified: Secondary | ICD-10-CM | POA: Diagnosis not present

## 2020-12-22 DIAGNOSIS — J189 Pneumonia, unspecified organism: Secondary | ICD-10-CM | POA: Diagnosis not present

## 2020-12-22 DIAGNOSIS — N390 Urinary tract infection, site not specified: Secondary | ICD-10-CM | POA: Diagnosis not present

## 2020-12-22 DIAGNOSIS — I110 Hypertensive heart disease with heart failure: Secondary | ICD-10-CM | POA: Diagnosis not present

## 2020-12-22 DIAGNOSIS — I5030 Unspecified diastolic (congestive) heart failure: Secondary | ICD-10-CM | POA: Diagnosis not present

## 2020-12-22 DIAGNOSIS — G9341 Metabolic encephalopathy: Secondary | ICD-10-CM | POA: Diagnosis not present

## 2020-12-22 DIAGNOSIS — J440 Chronic obstructive pulmonary disease with acute lower respiratory infection: Secondary | ICD-10-CM | POA: Diagnosis not present

## 2020-12-23 ENCOUNTER — Other Ambulatory Visit: Payer: Self-pay | Admitting: *Deleted

## 2020-12-23 MED ORDER — FUROSEMIDE 40 MG PO TABS: 40.0000 mg | ORAL_TABLET | Freq: Every day | ORAL | 0 refills | Status: DC

## 2020-12-29 ENCOUNTER — Ambulatory Visit (INDEPENDENT_AMBULATORY_CARE_PROVIDER_SITE_OTHER): Payer: Medicare HMO

## 2020-12-29 ENCOUNTER — Other Ambulatory Visit: Payer: Self-pay

## 2020-12-29 DIAGNOSIS — F17210 Nicotine dependence, cigarettes, uncomplicated: Secondary | ICD-10-CM

## 2020-12-29 DIAGNOSIS — J189 Pneumonia, unspecified organism: Secondary | ICD-10-CM | POA: Diagnosis not present

## 2020-12-29 DIAGNOSIS — I951 Orthostatic hypotension: Secondary | ICD-10-CM

## 2020-12-29 DIAGNOSIS — G4700 Insomnia, unspecified: Secondary | ICD-10-CM

## 2020-12-29 DIAGNOSIS — N390 Urinary tract infection, site not specified: Secondary | ICD-10-CM | POA: Diagnosis not present

## 2020-12-29 DIAGNOSIS — I5030 Unspecified diastolic (congestive) heart failure: Secondary | ICD-10-CM

## 2020-12-29 DIAGNOSIS — H409 Unspecified glaucoma: Secondary | ICD-10-CM

## 2020-12-29 DIAGNOSIS — E871 Hypo-osmolality and hyponatremia: Secondary | ICD-10-CM

## 2020-12-29 DIAGNOSIS — E876 Hypokalemia: Secondary | ICD-10-CM

## 2020-12-29 DIAGNOSIS — Z8546 Personal history of malignant neoplasm of prostate: Secondary | ICD-10-CM

## 2020-12-29 DIAGNOSIS — Z7951 Long term (current) use of inhaled steroids: Secondary | ICD-10-CM

## 2020-12-29 DIAGNOSIS — G4733 Obstructive sleep apnea (adult) (pediatric): Secondary | ICD-10-CM

## 2020-12-29 DIAGNOSIS — G9341 Metabolic encephalopathy: Secondary | ICD-10-CM | POA: Diagnosis not present

## 2020-12-29 DIAGNOSIS — J440 Chronic obstructive pulmonary disease with acute lower respiratory infection: Secondary | ICD-10-CM | POA: Diagnosis not present

## 2020-12-29 DIAGNOSIS — F32A Depression, unspecified: Secondary | ICD-10-CM

## 2020-12-29 DIAGNOSIS — E785 Hyperlipidemia, unspecified: Secondary | ICD-10-CM

## 2020-12-29 DIAGNOSIS — I110 Hypertensive heart disease with heart failure: Secondary | ICD-10-CM | POA: Diagnosis not present

## 2021-01-01 ENCOUNTER — Other Ambulatory Visit: Payer: Self-pay

## 2021-01-01 ENCOUNTER — Encounter: Payer: Self-pay | Admitting: Family Medicine

## 2021-01-01 ENCOUNTER — Ambulatory Visit (INDEPENDENT_AMBULATORY_CARE_PROVIDER_SITE_OTHER): Payer: Medicare HMO | Admitting: Family Medicine

## 2021-01-01 VITALS — BP 86/60 | HR 102 | Temp 98.2°F | Ht 72.0 in | Wt 201.8 lb

## 2021-01-01 DIAGNOSIS — E559 Vitamin D deficiency, unspecified: Secondary | ICD-10-CM | POA: Diagnosis not present

## 2021-01-01 DIAGNOSIS — G4733 Obstructive sleep apnea (adult) (pediatric): Secondary | ICD-10-CM | POA: Diagnosis not present

## 2021-01-01 DIAGNOSIS — E782 Mixed hyperlipidemia: Secondary | ICD-10-CM

## 2021-01-01 DIAGNOSIS — Z125 Encounter for screening for malignant neoplasm of prostate: Secondary | ICD-10-CM | POA: Diagnosis not present

## 2021-01-01 DIAGNOSIS — I10 Essential (primary) hypertension: Secondary | ICD-10-CM | POA: Diagnosis not present

## 2021-01-01 DIAGNOSIS — J441 Chronic obstructive pulmonary disease with (acute) exacerbation: Secondary | ICD-10-CM

## 2021-01-01 MED ORDER — ALBUTEROL SULFATE HFA 108 (90 BASE) MCG/ACT IN AERS: 2.0000 | INHALATION_SPRAY | RESPIRATORY_TRACT | 1 refills | Status: DC | PRN

## 2021-01-01 MED ORDER — FUROSEMIDE 40 MG PO TABS: 40.0000 mg | ORAL_TABLET | Freq: Every day | ORAL | 2 refills | Status: DC

## 2021-01-01 MED ORDER — DICLOFENAC SODIUM 1 % EX GEL: 2.0000 g | Freq: Four times a day (QID) | CUTANEOUS | 1 refills | Status: DC

## 2021-01-01 MED ORDER — BREO ELLIPTA 100-25 MCG/INH IN AEPB: 1.0000 | INHALATION_SPRAY | Freq: Every day | RESPIRATORY_TRACT | 11 refills | Status: DC

## 2021-01-01 NOTE — Progress Notes (Signed)
Subjective:  Patient ID: Steven Howard, male    DOB: 1951-01-21  Age: 70 y.o. MRN: [id number]  CC: Medical Management of Chronic Issues   HPI OKEY ZELEK presents for Annual check. Sneezed and felt stinging at the groin.   Drinking 2-4 beers daily. Smoking 1/2 pack daily. Dyspnea currently at baseline. Using inhalers as below   Swelling in the feet and ankles.    presents for  follow-up of hypertension. Patient has no history of headache chest pain or shortness of breath or recent cough. Patient also denies symptoms of TIA such as focal numbness or weakness. Patient denies side effects from medication. States taking it regularly.     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   He has a past surgical history that includes Prostate surgery (2011) and Cystoscopy with urethral dilatation (N/A, 06/27/2018).   His family history includes Alcohol abuse in his brother; COPD in his brother; Heart disease in his maternal grandmother; Stroke in his mother.He reports that he has been smoking cigars and cigarettes. He has a 13.75 pack-year smoking history. He has never used smokeless tobacco. He reports  current alcohol use. He reports that he does not use drugs.    ROS Review of Systems  Constitutional: Negative.   HENT: Negative.   Eyes: Negative for visual disturbance.  Respiratory: Negative for cough and shortness of breath.   Cardiovascular: Negative for chest pain and leg swelling.  Gastrointestinal: Negative for abdominal pain, diarrhea, nausea and vomiting.  Genitourinary: Negative for difficulty urinating.  Musculoskeletal: Negative for arthralgias and myalgias.  Skin: Negative for rash.  Neurological: Negative for headaches.  Psychiatric/Behavioral: Negative for sleep disturbance.    Objective:  BP (!) 86/60   Pulse (!) 102   Temp 98.2 F (36.8 C)   Ht 6' (1.829 m)   Wt 201 lb 12.8 oz (91.5 kg)   SpO2 93%   BMI 27.37 kg/m   BP Readings from Last 3 Encounters:  01/01/21 (!) 86/60  12/03/20 (!) 144/98  11/25/20 95/62    Wt Readings from Last 3 Encounters:  01/01/21 201 lb 12.8 oz (91.5 kg)  11/26/20 200 lb (90.7 kg)  07/03/20 210 lb 3.2 oz (95.3 kg)     Physical Exam Constitutional:      General: He is not in acute distress.    Appearance: He is well-developed.  HENT:     Head: Normocephalic and atraumatic.     Right Ear: External ear normal.     Left Ear: External ear normal.     Nose: Nose normal.  Eyes:     Conjunctiva/sclera: Conjunctivae normal.     Pupils: Pupils are equal, round, and  reactive to light.  Cardiovascular:     Rate and Rhythm: Normal rate and regular rhythm.     Heart sounds: Normal heart sounds. No murmur heard.   Pulmonary:     Effort: Pulmonary effort is normal. No respiratory distress.     Breath sounds: Normal breath sounds. No wheezing or rales.  Abdominal:     Palpations: Abdomen is soft.     Tenderness: There is no abdominal tenderness.  Musculoskeletal:        General: Swelling and tenderness (left adductors at origin) present. Normal range of motion.     Cervical back: Normal range of motion and neck supple.   Skin:    General: Skin is warm and dry.  Neurological:     Mental Status: He is alert and oriented to person, place, and time.     Deep Tendon Reflexes: Reflexes are normal and symmetric.  Psychiatric:        Behavior: Behavior normal.        Thought Content: Thought content normal.        Judgment: Judgment normal.       Assessment & Plan:   Ishaq was seen today for medical management of chronic issues.  Diagnoses and all orders for this visit:  Chronic obstructive pulmonary disease with acute exacerbation ([place]) -     CBC with Differential/Platelet -     CMP14+EGFR  Essential hypertension -     CBC with Differential/Platelet -     CMP14+EGFR  OSA (obstructive sleep apnea) -     CBC with Differential/Platelet -     CMP14+EGFR  Screening for prostate cancer -     CBC with Differential/Platelet -     CMP14+EGFR -     PSA, total and free  Vitamin D deficiency -     CBC with Differential/Platelet -     CMP14+EGFR -     VITAMIN D 25 Hydroxy (Vit-D Deficiency, Fractures)  Mixed hyperlipidemia -     Lipid panel  Other orders -     fluticasone furoate-vilanterol (BREO ELLIPTA) 100-25 MCG/INH AEPB; Inhale 1 puff into the lungs daily. -     furosemide (LASIX) 40 MG tablet; Take 1 tablet (40 mg total) by mouth daily. -     albuterol (VENTOLIN HFA) 108 (90 Base) MCG/ACT inhaler; Inhale 2 puffs into the lungs every 4 (four) hours as needed for wheezing or shortness of breath (cough, shortness of breath or wheezing.). -     diclofenac Sodium (VOLTAREN) 1 % GEL; Apply 2 g topically 4 (four) times daily.       I have changed Keaun F. Cheramie's Breo Ellipta. I am also having him start on diclofenac Sodium. Additionally, I am having him maintain his ipratropium-albuterol, multivitamin with minerals, nicotine, metoprolol tartrate, saccharomyces boulardii, mupirocin cream, folic acid, melatonin, potassium chloride SA, thiamine, furosemide, and albuterol.  Allergies as of  01/01/2021   No Known Allergies     Medication List       Accurate as of Jan 01, 2021 11:59 PM. If you have any questions, ask your nurse or doctor.        albuterol 108 (90 Base) MCG/ACT inhaler Commonly known as: VENTOLIN HFA Inhale 2 puffs into the lungs every 4 (four) hours as needed for wheezing or shortness of breath (cough, shortness of breath or wheezing.).   Breo Ellipta 100-25 MCG/INH Aepb Generic drug: fluticasone furoate-vilanterol Inhale 1 puff into the lungs daily. What changed: See the new instructions. Changed  by: Claretta Fraise, MD   diclofenac Sodium 1 % Gel Commonly known as: VOLTAREN Apply 2 g topically 4 (four) times daily. Started by: Claretta Fraise, MD   folic acid 1 MG tablet Commonly known as: FOLVITE Take 1 tablet (1 mg total) by mouth daily.   furosemide 40 MG tablet Commonly known as: LASIX Take 1 tablet (40 mg total) by mouth daily.   ipratropium-albuterol 0.5-2.5 (3) MG/3ML Soln Commonly known as: DUONEB Take 3 mLs by nebulization every 4 (four) hours as needed.   melatonin 3 MG Tabs tablet Take 1 tablet (3 mg total) by mouth at bedtime.   metoprolol tartrate 25 MG tablet Commonly known as: LOPRESSOR Take 0.5 tablets (12.5 mg total) by mouth 2 (two) times daily.   multivitamin with minerals Tabs tablet Take 1 tablet by mouth daily.   mupirocin cream 2 % Commonly known as: Bactroban Apply to affected area on his back 3 times daily   nicotine 21 mg/24hr patch Commonly known as: NICODERM CQ - dosed in mg/24 hours Place 1 patch (21 mg total) onto the skin daily as needed (nicotine cravings).   potassium chloride SA 20 MEQ tablet Commonly known as: KLOR-CON Take 1 tablet (20 mEq total) by mouth daily.   saccharomyces boulardii 250 MG capsule Commonly known as: Florastor Take 1 capsule (250 mg total) by mouth 2 (two) times daily.   thiamine 100 MG tablet Take 1 tablet (100 mg total) by mouth daily.        Follow-up: Return in  about 6 months (around 07/04/2021).  Claretta Fraise, M.D.

## 2021-01-02 LAB — CBC WITH DIFFERENTIAL/PLATELET
Basophils Absolute: 0.1 10*3/uL (ref 0.0–0.2)
Basos: 1 %
EOS (ABSOLUTE): 0.4 10*3/uL (ref 0.0–0.4)
Eos: 5 %
Hematocrit: 47.7 % (ref 37.5–51.0)
Hemoglobin: 16.1 g/dL (ref 13.0–17.7)
Immature Grans (Abs): 0 10*3/uL (ref 0.0–0.1)
Immature Granulocytes: 0 %
Lymphocytes Absolute: 3 10*3/uL (ref 0.7–3.1)
Lymphs: 40 %
MCH: 33.5 pg — ABNORMAL HIGH (ref 26.6–33.0)
MCHC: 33.8 g/dL (ref 31.5–35.7)
MCV: 99 fL — ABNORMAL HIGH (ref 79–97)
Monocytes Absolute: 0.9 10*3/uL (ref 0.1–0.9)
Monocytes: 12 %
Neutrophils Absolute: 3.2 10*3/uL (ref 1.4–7.0)
Neutrophils: 42 %
Platelets: 218 10*3/uL (ref 150–450)
RBC: 4.8 x10E6/uL (ref 4.14–5.80)
RDW: 13.1 % (ref 11.6–15.4)
WBC: 7.5 10*3/uL (ref 3.4–10.8)

## 2021-01-02 LAB — CMP14+EGFR
ALT: 9 IU/L (ref 0–44)
AST: 18 IU/L (ref 0–40)
Albumin/Globulin Ratio: 1.3 (ref 1.2–2.2)
Albumin: 3.4 g/dL — ABNORMAL LOW (ref 3.7–4.7)
Alkaline Phosphatase: 63 IU/L (ref 44–121)
BUN/Creatinine Ratio: 15 (ref 10–24)
BUN: 9 mg/dL (ref 8–27)
Bilirubin Total: 0.6 mg/dL (ref 0.0–1.2)
CO2: 27 mmol/L (ref 20–29)
Calcium: 9.5 mg/dL (ref 8.6–10.2)
Chloride: 95 mmol/L — ABNORMAL LOW (ref 96–106)
Creatinine, Ser: 0.62 mg/dL — ABNORMAL LOW (ref 0.76–1.27)
Globulin, Total: 2.7 g/dL (ref 1.5–4.5)
Glucose: 79 mg/dL (ref 65–99)
Potassium: 4.5 mmol/L (ref 3.5–5.2)
Sodium: 135 mmol/L (ref 134–144)
Total Protein: 6.1 g/dL (ref 6.0–8.5)
eGFR: 102 mL/min/{1.73_m2} (ref >59)

## 2021-01-02 LAB — LIPID PANEL
Chol/HDL Ratio: 3.7 ratio (ref 0.0–5.0)
Cholesterol, Total: 189 mg/dL (ref 100–199)
HDL: 51 mg/dL (ref >39)
LDL Chol Calc (NIH): 120 mg/dL — ABNORMAL HIGH (ref 0–99)
Triglycerides: 101 mg/dL (ref 0–149)
VLDL Cholesterol Cal: 18 mg/dL (ref 5–40)

## 2021-01-02 LAB — PSA, TOTAL AND FREE
PSA, Free: <0.01 ng/mL
Prostate Specific Ag, Serum: <0.1 ng/mL (ref 0.0–4.0)

## 2021-01-02 LAB — VITAMIN D 25 HYDROXY (VIT D DEFICIENCY, FRACTURES): Vit D, 25-Hydroxy: 13.6 ng/mL — ABNORMAL LOW (ref 30.0–100.0)

## 2021-01-06 ENCOUNTER — Ambulatory Visit: Payer: Medicare HMO | Admitting: Urology

## 2021-01-08 ENCOUNTER — Ambulatory Visit: Payer: Medicare HMO | Admitting: Family Medicine

## 2021-03-08 NOTE — Progress Notes (Signed)
History of Present Illness: He is here today for follow-up of treated prostate cancer and history of bulbous urethral stricture  Initial diagnosis of prostate cancer on 5.25.2012 At that time, PSA 9.5, prostatic volume 36.5 g, PSA density 0.26. He did have a prostatic nodule.   Initial pathology--2/12 cores positive, both revealing GS 3+4, one of the cores had 99% involvement. He was initiated on neoadjuvant androgen deprivation therapy on 7.2.20.12 He underwent combination radiotherapy, completed in October, 2012.  7.12.2022: He has had an excellent PSA response, most recently undetectable in May of this year.  He has had no blood in his urine or stool.   Bulbous urethral stricture   He underwent cystoscopy, balloon dilation of bulbous urethral stricture on 10.29.2019.   7.12.2022: He still catheterizes once a week.  Never difficult doing this.  He does not have large residuals.  He does have urinary frequency, urgency and urgency incontinence.  He has limited mobility.  Past Medical History:  Diagnosis Date   Acute metabolic encephalopathy 2/67/1245   Cancer ([place]) 2011   Prostate   Circadian rhythm sleep disorder, shift work type 05/19/2018   Community acquired pneumonia 11/27/2020   COPD (chronic obstructive pulmonary disease) ([place])    Glaucoma    Hypoxia 06/20/2020   Lactic acidosis 11/27/2020   Recurrent major depressive disorder, in partial remission ([place]) 04/18/2018   Sleep apnea     Past Surgical History:  Procedure Laterality Date   CYSTOSCOPY WITH URETHRAL DILATATION N/A 06/27/2018   Procedure: CYSTOSCOPY WITH BALLOON DILATATION OF URETHRAL STRICTURE;  Surgeon: Franchot Gallo, MD;  Location: [facility];  Service: Urology;  Laterality: N/A;   PROSTATE SURGERY  2011   seeds implanted/radiation    Home Medications:  Allergies as of 03/10/2021   No Known Allergies      Medication List        Accurate as of March 08, 2021  8:40 PM. If you have any questions, ask  your nurse or doctor.          albuterol 108 (90 Base) MCG/ACT inhaler Commonly known as: VENTOLIN HFA Inhale 2 puffs into the lungs every 4 (four) hours as needed for wheezing or shortness of breath (cough, shortness of breath or wheezing.).   Breo Ellipta 100-25 MCG/INH Aepb Generic drug: fluticasone furoate-vilanterol Inhale 1 puff into the lungs daily.   diclofenac Sodium 1 % Gel Commonly known as: VOLTAREN Apply 2 g topically 4 (four) times daily.   folic acid 1 MG tablet Commonly known as: FOLVITE Take 1 tablet (1 mg total) by mouth daily.   furosemide 40 MG tablet Commonly known as: LASIX Take 1 tablet (40 mg total) by mouth daily.   ipratropium-albuterol 0.5-2.5 (3) MG/3ML Soln Commonly known as: DUONEB Take 3 mLs by nebulization every 4 (four) hours as needed.   melatonin 3 MG Tabs tablet Take 1 tablet (3 mg total) by mouth at bedtime.   metoprolol tartrate 25 MG tablet Commonly known as: LOPRESSOR Take 0.5 tablets (12.5 mg total) by mouth 2 (two) times daily.   multivitamin with minerals Tabs tablet Take 1 tablet by mouth daily.   mupirocin cream 2 % Commonly known as: Bactroban Apply to affected area on his back 3 times daily   nicotine 21 mg/24hr patch Commonly known as: NICODERM CQ - dosed in mg/24 hours Place 1 patch (21 mg total) onto the skin daily as needed (nicotine cravings).   potassium chloride SA 20 MEQ tablet Commonly known as: KLOR-CON Take 1 tablet (  20 mEq total) by mouth daily.   saccharomyces boulardii 250 MG capsule Commonly known as: Florastor Take 1 capsule (250 mg total) by mouth 2 (two) times daily.   thiamine 100 MG tablet Take 1 tablet (100 mg total) by mouth daily.        Allergies: No Known Allergies  Family History  Problem Relation Age of Onset   Stroke Mother    Alcohol abuse Brother    Heart disease Maternal Grandmother    COPD Brother     Social History:  reports that he has been smoking cigars and  cigarettes. He has a 13.75 pack-year smoking history. He has never used smokeless tobacco. He reports current alcohol use. He reports that he does not use drugs.  ROS: A complete review of systems was performed.  All systems are negative except for pertinent findings as noted.  Physical Exam:  Vital signs in last 24 hours: There were no vitals taken for this visit. Constitutional:  Alert and oriented, No acute distress Cardiovascular: Regular rate  Respiratory: Normal respiratory effort GI: Abdomen is soft, nontender, nondistended, no abdominal masses.  Genitourinary: Normal anal sphincter tone.  Prostate flat, smooth, no nodularity. Lymphatic: No lymphadenopathy Neurologic: Grossly intact, no focal deficits Psychiatric: Normal mood and affect  I have reviewed prior pt notes  I have reviewed urinalysis results  I have reviewed prior PSA results       Impression/Assessment:  1.  History of prostate cancer, status post combination radiotherapy and ADT.  No evidence of recurrence now 10 years out  2.  Bulbous urethral stricture, status post intervention after his radiation.  He still is on self-catheterization but voids well  3.  Overactive bladder with leakage  Plan:  1.  I think he can back off on self-catheterization to every other week  2.  I gave him samples of Myrbetriq 25 mg.  He will take for 6 weeks.  If this helps he will call for prescription of Myrbetriq or a substitute  3.  I will have him come back in 1 year with PSA

## 2021-03-10 ENCOUNTER — Other Ambulatory Visit: Payer: Self-pay

## 2021-03-10 ENCOUNTER — Ambulatory Visit: Payer: Medicare HMO | Admitting: Urology

## 2021-03-10 ENCOUNTER — Encounter: Payer: Self-pay | Admitting: Urology

## 2021-03-10 VITALS — BP 80/52 | HR 105 | Temp 98.2°F

## 2021-03-10 DIAGNOSIS — R350 Frequency of micturition: Secondary | ICD-10-CM

## 2021-03-10 DIAGNOSIS — C61 Malignant neoplasm of prostate: Secondary | ICD-10-CM | POA: Diagnosis not present

## 2021-03-10 DIAGNOSIS — R3915 Urgency of urination: Secondary | ICD-10-CM

## 2021-03-10 DIAGNOSIS — Z8546 Personal history of malignant neoplasm of prostate: Secondary | ICD-10-CM

## 2021-03-10 NOTE — Progress Notes (Signed)
Urological Symptom Review  Patient is experiencing the following symptoms: Frequent urination Hard to postpone urination Get up at night to urinate Leakage of urine   Review of Systems  Gastrointestinal (upper)  : Negative for upper GI symptoms  Gastrointestinal (lower) : Negative for lower GI symptoms  Constitutional : Fatigue  Skin: Negative for skin symptoms  Eyes: Negative for eye symptoms  Ear/Nose/Throat : Negative for Ear/Nose/Throat symptoms  Hematologic/Lymphatic: Bruise/bleedeasily  Cardiovascular : Negative for cardiovascular symptoms  Respiratory : Shortness of breath  Endocrine: Negative for endocrine symptoms  Musculoskeletal: Joint pain  Neurological: Dizziness  Psychologic: Negative for psychiatric symptoms

## 2021-05-06 DIAGNOSIS — S50811A Abrasion of right forearm, initial encounter: Secondary | ICD-10-CM | POA: Diagnosis not present

## 2021-05-06 DIAGNOSIS — R42 Dizziness and giddiness: Secondary | ICD-10-CM | POA: Diagnosis not present

## 2021-05-06 DIAGNOSIS — R319 Hematuria, unspecified: Secondary | ICD-10-CM | POA: Diagnosis not present

## 2021-05-06 DIAGNOSIS — S0081XA Abrasion of other part of head, initial encounter: Secondary | ICD-10-CM | POA: Diagnosis not present

## 2021-05-06 DIAGNOSIS — N390 Urinary tract infection, site not specified: Secondary | ICD-10-CM | POA: Diagnosis not present

## 2021-05-06 DIAGNOSIS — S62312A Displaced fracture of base of third metacarpal bone, right hand, initial encounter for closed fracture: Secondary | ICD-10-CM | POA: Diagnosis not present

## 2021-05-06 DIAGNOSIS — R4182 Altered mental status, unspecified: Secondary | ICD-10-CM | POA: Diagnosis not present

## 2021-05-06 DIAGNOSIS — S62342A Nondisplaced fracture of base of third metacarpal bone, right hand, initial encounter for closed fracture: Secondary | ICD-10-CM | POA: Diagnosis not present

## 2021-05-06 DIAGNOSIS — Z20822 Contact with and (suspected) exposure to covid-19: Secondary | ICD-10-CM | POA: Diagnosis not present

## 2021-05-06 DIAGNOSIS — J439 Emphysema, unspecified: Secondary | ICD-10-CM | POA: Diagnosis not present

## 2021-05-06 DIAGNOSIS — S0990XA Unspecified injury of head, initial encounter: Secondary | ICD-10-CM | POA: Diagnosis not present

## 2021-05-06 DIAGNOSIS — M7989 Other specified soft tissue disorders: Secondary | ICD-10-CM | POA: Diagnosis not present

## 2021-05-06 DIAGNOSIS — W1839XA Other fall on same level, initial encounter: Secondary | ICD-10-CM | POA: Diagnosis not present

## 2021-05-06 DIAGNOSIS — Z043 Encounter for examination and observation following other accident: Secondary | ICD-10-CM | POA: Diagnosis not present

## 2021-05-06 DIAGNOSIS — J329 Chronic sinusitis, unspecified: Secondary | ICD-10-CM | POA: Diagnosis not present

## 2021-05-06 DIAGNOSIS — S62310A Displaced fracture of base of second metacarpal bone, right hand, initial encounter for closed fracture: Secondary | ICD-10-CM | POA: Diagnosis not present

## 2021-05-11 DIAGNOSIS — S62310A Displaced fracture of base of second metacarpal bone, right hand, initial encounter for closed fracture: Secondary | ICD-10-CM | POA: Diagnosis not present

## 2021-05-11 DIAGNOSIS — S62309A Unspecified fracture of unspecified metacarpal bone, initial encounter for closed fracture: Secondary | ICD-10-CM | POA: Diagnosis not present

## 2021-05-11 DIAGNOSIS — S62201A Unspecified fracture of first metacarpal bone, right hand, initial encounter for closed fracture: Secondary | ICD-10-CM | POA: Diagnosis not present

## 2021-05-11 DIAGNOSIS — S62316A Displaced fracture of base of fifth metacarpal bone, right hand, initial encounter for closed fracture: Secondary | ICD-10-CM | POA: Diagnosis not present

## 2021-05-11 DIAGNOSIS — S62312A Displaced fracture of base of third metacarpal bone, right hand, initial encounter for closed fracture: Secondary | ICD-10-CM | POA: Diagnosis not present

## 2021-06-03 DIAGNOSIS — S62309A Unspecified fracture of unspecified metacarpal bone, initial encounter for closed fracture: Secondary | ICD-10-CM | POA: Diagnosis not present

## 2021-06-03 DIAGNOSIS — S62301A Unspecified fracture of second metacarpal bone, left hand, initial encounter for closed fracture: Secondary | ICD-10-CM | POA: Diagnosis not present

## 2021-07-02 ENCOUNTER — Encounter: Payer: Self-pay | Admitting: Family Medicine

## 2021-07-02 ENCOUNTER — Ambulatory Visit (INDEPENDENT_AMBULATORY_CARE_PROVIDER_SITE_OTHER): Payer: Medicare HMO | Admitting: Family Medicine

## 2021-07-02 ENCOUNTER — Other Ambulatory Visit: Payer: Self-pay

## 2021-07-02 VITALS — Ht 72.0 in

## 2021-07-02 DIAGNOSIS — E782 Mixed hyperlipidemia: Secondary | ICD-10-CM | POA: Diagnosis not present

## 2021-07-02 DIAGNOSIS — I10 Essential (primary) hypertension: Secondary | ICD-10-CM | POA: Diagnosis not present

## 2021-07-02 LAB — LIPID PANEL
Chol/HDL Ratio: 2.2 ratio (ref 0.0–5.0)
Cholesterol, Total: 172 mg/dL (ref 100–199)
HDL: 79 mg/dL (ref >39)
LDL Chol Calc (NIH): 77 mg/dL (ref 0–99)
Triglycerides: 87 mg/dL (ref 0–149)
VLDL Cholesterol Cal: 16 mg/dL (ref 5–40)

## 2021-07-02 LAB — CMP14+EGFR
ALT: 19 IU/L (ref 0–44)
AST: 31 IU/L (ref 0–40)
Albumin/Globulin Ratio: 1.7 (ref 1.2–2.2)
Albumin: 3.8 g/dL (ref 3.7–4.7)
Alkaline Phosphatase: 78 IU/L (ref 44–121)
BUN/Creatinine Ratio: 15 (ref 10–24)
BUN: 11 mg/dL (ref 8–27)
Bilirubin Total: 1 mg/dL (ref 0.0–1.2)
CO2: 31 mmol/L — ABNORMAL HIGH (ref 20–29)
Calcium: 9.4 mg/dL (ref 8.6–10.2)
Chloride: 88 mmol/L — ABNORMAL LOW (ref 96–106)
Creatinine, Ser: 0.75 mg/dL — ABNORMAL LOW (ref 0.76–1.27)
Globulin, Total: 2.3 g/dL (ref 1.5–4.5)
Glucose: 138 mg/dL — ABNORMAL HIGH (ref 70–99)
Potassium: 4.3 mmol/L (ref 3.5–5.2)
Sodium: 132 mmol/L — ABNORMAL LOW (ref 134–144)
Total Protein: 6.1 g/dL (ref 6.0–8.5)
eGFR: 96 mL/min/{1.73_m2} (ref >59)

## 2021-07-02 LAB — CBC WITH DIFFERENTIAL/PLATELET
Basophils Absolute: 0.1 10*3/uL (ref 0.0–0.2)
Basos: 1 %
EOS (ABSOLUTE): 0.2 10*3/uL (ref 0.0–0.4)
Eos: 2 %
Hematocrit: 52.5 % — ABNORMAL HIGH (ref 37.5–51.0)
Hemoglobin: 18.3 g/dL — ABNORMAL HIGH (ref 13.0–17.7)
Immature Grans (Abs): 0 10*3/uL (ref 0.0–0.1)
Immature Granulocytes: 0 %
Lymphocytes Absolute: 2.3 10*3/uL (ref 0.7–3.1)
Lymphs: 35 %
MCH: 34.5 pg — ABNORMAL HIGH (ref 26.6–33.0)
MCHC: 34.9 g/dL (ref 31.5–35.7)
MCV: 99 fL — ABNORMAL HIGH (ref 79–97)
Monocytes Absolute: 0.7 10*3/uL (ref 0.1–0.9)
Monocytes: 11 %
Neutrophils Absolute: 3.4 10*3/uL (ref 1.4–7.0)
Neutrophils: 51 %
Platelets: 206 10*3/uL (ref 150–450)
RBC: 5.31 x10E6/uL (ref 4.14–5.80)
RDW: 12.3 % (ref 11.6–15.4)
WBC: 6.6 10*3/uL (ref 3.4–10.8)

## 2021-07-02 MED ORDER — FLUTICASONE FUROATE-VILANTEROL 200-25 MCG/ACT IN AEPB: 1.0000 | INHALATION_SPRAY | Freq: Every day | RESPIRATORY_TRACT | 11 refills | Status: DC

## 2021-07-02 MED ORDER — ALBUTEROL SULFATE HFA 108 (90 BASE) MCG/ACT IN AERS: 2.0000 | INHALATION_SPRAY | RESPIRATORY_TRACT | 11 refills | Status: DC | PRN

## 2021-07-02 NOTE — Patient Instructions (Signed)
Ward Givens, NP   Nurse Practitioner  Neurology  Progress Notes     Signed  Encounter Date:  08/31/2018           Signed      Expand All Collapse All                                                                                                                                                                                                                  PATIENT: Steven Howard DOB: June 22, 1951   REASON FOR VISIT: follow up HISTORY FROM: patient   HISTORY OF PRESENT ILLNESS: Today 08/31/18:   Steven Howard is a 70 year old male with a history of obstructive sleep apnea on BiPAP.  He returns today for follow-up.  His download indicates that he uses machine 25 out of 30 days for compliance of 83%.  He uses machine greater than 4 hours 15 days for compliance of 50%.  On average he uses his machine 4 hours and 14 minutes.  His residual AHI is 9.2 on inspiratory pressure of 24 cm of water and expiratory pressure of 19 cm of water.  His leak in the 95th percentile is 85.3 L/min.  The patient states that his mask is continuously leaking.  He states that he has tried adjusting the straps but the mask continues to leak.  Also with the original order for [place] 2 L of oxygen was ordered however the patient states that he has never been put on oxygen.  He returns today for evaluation.   HISTORY Steven Howard is a 70 y.o. male patient, seen here in a referral from Dr. Livia Snellen. The patient moved from [facility] to [place] almost a year ago after being widowed, has just established himself at the new PCP . He had a sleep study in [facility], [place] [facility] [place], [place]. Sleep study took place between the years 2008 on 2010, and apparently the patient was diagnosed with apnea and ordered a BiPAP machine, within 2 years or 3 years of using it there were insurance issues and he finally had to stop it around 8 years ago.    Dr. Livia Snellen also stated that the patient has a history of comp prostate cancer as well as benign prostate hyperplasia and has issues with urinary hesitancy and nocturia, hematuria have been reported.  He has been treated for hypertension and elevated cholesterol but has not been taking any prescription medication.  Has been a smoker for many years used  an inhaler in the past and has dyspnea on exertion.  According to her previous records COPD was diagnosed.      Chief complaint according to patient : He has difficulties getting a good night sleep, awakens multiple times and does not feel refreshed and restored the following morning.  He feels fatigued and excessively daytime sleepy.     Sleep habits are as follows: the patient's dinnertime is around 7 PM, and he will spend the evening hours after dinner watching TV, he reports not falling asleep while watching TV in the living room.  He will move over to his bedroom around 9:30 PM, his bedroom is cool, not quiet and not dark.  He does not sleep without TV and he sleeps poorly with TV. He sleeps supine on an adjustable bed- 25 degrees. He uses 4 pillows- he almost sits up in bed- orthopnea.   He does not read in bed usually, and he reports that he wakes up so frequently after going to sleep at 10- PM, he will wake up about every 2 hours also, has to go to the bathroom, up to 5 times each night- he denies any palpitations, pain, discomfort or shortness of breath. He dreams Sometimes.  No lightheadedness.  He does not wake with headaches no new headaches wake him.  He rises between 8 and 9 AM, waking up spontaneously.  Each bathroom breaks takes him about half an hour before he can go back to sleep, and he estimates that he will get about 7 hours of sleep out of the 12-hour time in bed.   Sleep medical history and family sleep history:  " Nobody else has apnea".      Social history:  Widowed after " 38 years of marriage in 2014/12/11" , he must have been 21 at  the time.  2 grown sons, 2 granddaughters..  Originally from [place] her return to this state  12-10-16 after his wife died. Drinks daily 2-3 beers , and additionally vodka or bourbon at bed time.  Cut way back on smoking to 1/2 ppd, used to smoke 1.5 ppd, she started at age 52. He worked swing shifts for decades. [employer]. Retired in 05-2017.      REVIEW OF SYSTEMS: Out of a complete 14 system review of symptoms, the patient complains only of the following symptoms, and all other reviewed systems are negative.   Epworth sleepiness score 6   ALLERGIES: No Known Allergies   HOME MEDICATIONS:       Outpatient Medications Prior to Visit  Medication Sig Dispense Refill   albuterol (PROVENTIL HFA;VENTOLIN HFA) 108 (90 Base) MCG/ACT inhaler Inhale 2 puffs into the lungs every 6 (six) hours as needed for wheezing or shortness of breath. 1 Inhaler 0    No facility-administered medications prior to visit.       PAST MEDICAL HISTORY:     Past Medical History:  Diagnosis Date   Cancer ([place]) Dec 10, 2009   COPD (chronic obstructive pulmonary disease) ([place])     Glaucoma     Sleep apnea        PAST SURGICAL HISTORY:      Past Surgical History:  Procedure Laterality Date   CYSTOSCOPY WITH URETHRAL DILATATION N/A 06/27/2018    Procedure: CYSTOSCOPY WITH BALLOON DILATATION OF URETHRAL STRICTURE;  Surgeon: Franchot Gallo, MD;  Location: [facility];  Service: Urology;  Laterality: N/A;   PROSTATE SURGERY   12-10-2009    seeds implanted/radiation      FAMILY HISTORY:  Family History  Problem Relation Age of Onset   Alcohol abuse Brother     COPD Brother        SOCIAL HISTORY: Social History         Socioeconomic History   Marital status: Widowed      Spouse name: Not on file   Number of children: Not on file   Years of education: Not on file   Highest education level: Not on file  Occupational History   Not on file  Social Needs   Financial resource strain: Not on file   Food insecurity:       Worry: Not on file      Inability: Not on file   Transportation needs:      Medical: Not on file      Non-medical: Not on file  Tobacco Use   Smoking status: Current Every Day Smoker      Packs/day: 0.50      Years: 30.00      Pack years: 15.00      Types: Cigars, Cigarettes   Smokeless tobacco: Never Used  Substance and Sexual Activity   Alcohol use: Yes      Alcohol/week: 5.0 standard drinks      Types: 5 Standard drinks or equivalent per week   Drug use: Never   Sexual activity: Not Currently  Lifestyle   Physical activity:      Days per week: Not on file      Minutes per session: Not on file   Stress: Not on file  Relationships   Social connections:      Talks on phone: Not on file      Gets together: Not on file      Attends religious service: Not on file      Active member of club or organization: Not on file      Attends meetings of clubs or organizations: Not on file      Relationship status: Not on file   Intimate partner violence:      Fear of current or ex partner: Not on file      Emotionally abused: Not on file      Physically abused: Not on file      Forced sexual activity: Not on file  Other Topics Concern   Not on file  Social History Narrative   Not on file          PHYSICAL EXAM      Vitals:    08/31/18 1422  BP: 139/86  Pulse: 100  Weight: 228 lb 6.4 oz (103.6 kg)  Height: 6' (1.829 m)    Body mass index is 30.98 kg/m.   Generalized: Well developed, in no acute distress    Neurological examination  Mentation: Alert oriented to time, place, history taking. Follows all commands speech and language fluent Cranial nerve II-XII:  Extraocular movements were full, visual field were full on confrontational test. Facial sensation and strength were normal. Uvula tongue midline. Head turning and shoulder shrug  were normal and symmetric.  Mallampati 3+ Motor: The motor testing reveals 5 over 5 strength of all 4 extremities. Good symmetric  motor tone is noted throughout.  Sensory: Sensory testing is intact to soft touch on all 4 extremities. No evidence of extinction is noted.    Gait and station: Gait is normal.    DIAGNOSTIC DATA (LABS, IMAGING, TESTING) - I reviewed patient records, labs, notes, testing and imaging myself where available.  Recent Labs       Lab Results  Component Value Date    WBC 6.6 07/11/2018    HGB 17.8 (H) 07/11/2018    HCT 51.3 (H) 07/11/2018    MCV 100 (H) 07/11/2018    PLT 279 07/11/2018      Labs (Brief)          Component Value Date/Time    NA 136 07/11/2018 1044    K 4.4 07/11/2018 1044    CL 94 (L) 07/11/2018 1044    CO2 26 07/11/2018 1044    GLUCOSE 74 07/11/2018 1044    GLUCOSE 91 06/21/2018 1401    BUN 6 (L) 07/11/2018 1044    CREATININE 0.61 (L) 07/11/2018 1044    CALCIUM 9.6 07/11/2018 1044    PROT 6.4 07/11/2018 1044    ALBUMIN 3.8 07/11/2018 1044    AST 28 07/11/2018 1044    ALT 18 07/11/2018 1044    ALKPHOS 89 07/11/2018 1044    BILITOT 0.5 07/11/2018 1044    GFRNONAA 102 07/11/2018 1044    GFRAA 118 07/11/2018 1044      Recent Labs       Lab Results  Component Value Date    CHOL 182 04/18/2018    HDL 67 04/18/2018    LDLCALC 96 04/18/2018    TRIG 95 04/18/2018    CHOLHDL 2.7 04/18/2018      Recent Labs  No results found for: HGBA1C   Recent Labs       Lab Results  Component Value Date    VITAMINB12 1,843 (H) 04/18/2018      Recent Labs       Lab Results  Component Value Date    TSH 1.790 04/18/2018            ASSESSMENT AND PLAN 70 y.o. year old male  has a past medical history of Cancer ([place]) (2011), COPD (chronic obstructive pulmonary disease) ([place]), Glaucoma, and Sleep apnea. here with:   1.  Obstructive Sleep Apnea on BiPAP    The patient's download shows suboptimal compliance with an increase in residual AHI at 9.2.  The patient also has a significant leak with his mask.  I will provide an order for mask refitting.  I have also  asked the DME company to provide 2 L of oxygen as recommended in the original order for BiPAP.  Patient is advised that if his symptoms worsen or he develops new symptoms he should let us know.  He will follow-up in 6 months or sooner if needed.   I spent 15 minutes with the patient. 50% of this time was spent reviewing [place], MSN, NP-C 08/31/2018, 2:35 PM [facility] [address] [place] [address] [phone]            Electronically signed by Ward Givens, NP at 08/31/2018  2:58 PM Office Visit on 08/31/2018  Office Visit on 08/31/2018   Detailed Report  Note shared with patient Additional Documentation  Vitals:  BP 139/86 Pulse 100 Ht 6' (1.829 m) Wt 228 lb 6.4 oz (103.6 kg) BMI 30.98 kg/m BSA 2.29 m  More Vitals  Flowsheets:  Clinical Intake, Falls, MEWS Score, Anthropometrics   Encounter Info:  Billing Info, History, Allergies, Detailed Report    Orders Placed  For home use only DME continuous positive airway pressure (CPAP) Medication Changes   None   Medication List   Visit Diagnoses  Obstructive sleep apnea treated with bilevel positive airway pressure (BiPAP)  OSA and COPD overlap syndrome ([place])   Problem List

## 2021-07-02 NOTE — Progress Notes (Signed)
Subjective:  Patient ID: Steven Howard, male    DOB: 1951/06/13  Age: 70 y.o. MRN: [id number]  CC: Medical Management of Chronic Issues   HPI Steven Howard presents for follow-up of elevated cholesterol. Doing well without complaints on current medication. Denies side effects of statin including myalgia and arthralgia and nausea. Also in today for liver function testing. Currently no chest pain, shortness of breath or other cardiovascular related symptoms noted.   in for follow-up of elevated cholesterol. Doing well without complaints on current medication. Denies side effects of statin including myalgia and arthralgia and nausea. Currently no chest pain, shortness of breath or other cardiovascular related symptoms noted.  COPD uder treatment with Breo & albuterol.  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   He has a past surgical history that includes Prostate surgery (2011) and Cystoscopy with urethral dilatation (N/A, 06/27/2018).   His family history includes Alcohol abuse in his brother; COPD in his brother; Heart disease in his maternal grandmother; Stroke in his mother.He reports that he has been smoking cigars and cigarettes. He has a 13.75 pack-year smoking history. He has never used smokeless tobacco. He reports current alcohol use. He reports that he does not use drugs.  Current Outpatient Medications on File Prior to Visit  Medication Sig Dispense Refill   melatonin 3 MG TABS tablet Take 1 tablet (3 mg total) by mouth at bedtime.     Multiple Vitamin (MULTIVITAMIN WITH MINERALS) TABS tablet Take 1 tablet by  mouth daily.     mupirocin cream (BACTROBAN) 2 % Apply to affected area on his back 3 times daily     folic acid (FOLVITE) 1 MG tablet Take 1 tablet (1 mg total) by mouth daily. (Patient not taking: No sig reported)     saccharomyces boulardii (FLORASTOR) 250 MG capsule Take 1 capsule (250 mg total) by mouth 2 (two) times daily. (Patient not taking: No sig reported)     thiamine 100 MG tablet Take 1 tablet (100 mg total) by mouth daily. (Patient not taking: No sig reported)     No current facility-administered medications on file prior to visit.    ROS Review of Systems  Constitutional:  Negative for fever.  Respiratory:  Negative for shortness of breath.   Cardiovascular:  Negative for chest pain.  Musculoskeletal:  Negative for arthralgias.  Skin:  Negative for rash.   Objective:  Ht 6' (1.829 m)   BMI 27.37 kg/m   BP Readings from Last 3 Encounters:  03/10/21 (!) 80/52  01/01/21 (!) 86/60  12/03/20 (!) 144/98    Wt Readings from Last 3 Encounters:  01/01/21 201 lb 12.8 oz (91.5 kg)  11/26/20 200 lb (90.7 kg)  07/03/20 210 lb 3.2 oz (95.3 kg)     Physical Exam Vitals reviewed.  Constitutional:      Appearance: He is well-developed.  HENT:     Head: Normocephalic and atraumatic.     Right Ear: External ear normal.     Left Ear: External ear normal.     Mouth/Throat:     Pharynx: No oropharyngeal exudate or posterior oropharyngeal erythema.  Eyes:     Pupils: Pupils are equal, round, and reactive to light.  Cardiovascular:  Rate and Rhythm: Normal rate and regular rhythm.     Heart sounds: No murmur heard. Pulmonary:     Effort: No respiratory distress.     Breath sounds: Normal breath sounds.  Musculoskeletal:     Cervical back: Normal range of motion and neck supple.  Neurological:     Mental Status: He is alert and oriented to person, place, and time.    No results found for: HGBA1C  Lab Results  Component Value Date   WBC 6.6 07/02/2021   HGB  18.3 (H) 07/02/2021   HCT 52.5 (H) 07/02/2021   PLT 206 07/02/2021   GLUCOSE 138 (H) 07/02/2021   CHOL 172 07/02/2021   TRIG 87 07/02/2021   HDL 79 07/02/2021   LDLCALC 77 07/02/2021   ALT 19 07/02/2021   AST 31 07/02/2021   NA 132 (L) 07/02/2021   K 4.3 07/02/2021   CL 88 (L) 07/02/2021   CREATININE 0.75 (L) 07/02/2021   BUN 11 07/02/2021   CO2 31 (H) 07/02/2021   TSH 1.385 11/27/2020   INR 1.0 11/26/2020    DG Abdomen 1 View  Result Date: 11/27/2020 CLINICAL DATA:  Check gastric catheter placement EXAM: ABDOMEN - 1 VIEW COMPARISON:  None. FINDINGS: Gastric catheter is noted within the stomach. Scattered large and small bowel gas is noted. IMPRESSION: Gastric catheter in the stomach. Electronically Signed   By: Inez Catalina M.D.   On: 11/27/2020 03:35   CT HEAD WO CONTRAST  Result Date: 11/26/2020 CLINICAL DATA:  Focal neural deficit greater than 6 hours, stroke suspected. Responsive, intubated. History of prostate cancer. EXAM: CT HEAD WITHOUT CONTRAST TECHNIQUE: Contiguous axial images were obtained from the base of the skull through the vertex without intravenous contrast. COMPARISON:  CT head 05/25/2020, MRI head 05/25/2019 FINDINGS: Brain: Cerebral ventricle sizes are concordant with the degree of cerebral volume loss. Patchy and confluent areas of decreased attenuation are noted throughout the deep and periventricular white matter of the cerebral hemispheres bilaterally, compatible with chronic microvascular ischemic disease. No evidence of large-territorial acute infarction. No parenchymal hemorrhage. No mass lesion. No extra-axial collection. No mass effect or midline shift. No hydrocephalus. Basilar cisterns are patent. Vascular: No hyperdense vessel. Atherosclerotic calcifications are present within the cavernous internal carotid arteries. 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 Skull: No acute fracture or focal lesion. Sinuses/Orbits: Mucosal thickening of the left sphenoid sinus. Mucosal thickening of bilateral maxillary sinuses. Paranasal sinuses and mastoid air cells are clear. The orbits are unremarkable. Other: Partially visualized endotracheal tube. Question coiling of enteric tube within the mouth and neck on scout view. IMPRESSION: 1. No acute intracranial abnormality. 2. Question coiling of enteric tube within the mouth and neck on scout view. Electronically Signed   By: Iven Finn M.D.   On: 11/26/2020 19:07   DG CHEST PORT 1 VIEW  Result Date: 11/27/2020 CLINICAL DATA:  Respiratory failure EXAM: PORTABLE CHEST 1 VIEW COMPARISON:  11/26/2020 FINDINGS: Nasogastric tube is been advanced into the upper abdomen beyond the margin of the examination. Endotracheal tube tip is seen 2.2 cm above the carina. Mild diffuse thickening of the pulmonary interstitium is again seen. No confluent pulmonary infiltrate. No pneumothorax or pleural effusion. Cardiac size within normal limits. Pulmonary vascularity is normal. No acute bone abnormality. IMPRESSION: Support tubes in appropriate position. No focal pulmonary infiltrate. Chronic pulmonary interstitial changes. Electronically Signed   By: Fidela Salisbury  MD   On: 11/27/2020 03:37   DG Chest Portable 1 View  Result Date: 11/26/2020 CLINICAL DATA:  Intubated EXAM: PORTABLE CHEST 1 VIEW COMPARISON:  06/05/2020 FINDINGS: Magdalene Molly is poorly visible. Esophageal tube tip appears to be near or at the carina. Additional tubing tip at the superior margin of clavicles, possible esophageal tube. Cardiomegaly with vascular congestion. Patchy atelectasis or minimal infiltrate left base. Aortic atherosclerosis. No pneumothorax. IMPRESSION: 1. Cardiomegaly with vascular congestion. Patchy atelectasis or minimal infiltrate at the left base. 2. Poorly visible carina, suspect that endotracheal tube is at or near the carina 3.  Additional tubing tip at superior margin of clavicles, question esophageal tube; if there is esophageal tube placement, this should be repositioned/advanced. Electronically Signed   By: Donavan Foil M.D.   On: 11/26/2020 18:39   CT Angio Abd/Pel w/ and/or w/o  Result Date: 11/27/2020 CLINICAL DATA:  Acute mesenteric ischemia, lactic acidosis EXAM: CTA ABDOMEN AND PELVIS WITHOUT AND WITH CONTRAST TECHNIQUE: Multidetector CT imaging of the abdomen and pelvis was performed using the standard protocol during bolus administration of intravenous contrast. Multiplanar reconstructed images and MIPs were obtained and reviewed to evaluate the vascular anatomy. CONTRAST:  167mL OMNIPAQUE IOHEXOL 300 MG/ML  SOLN COMPARISON:  None. FINDINGS: VASCULAR Aorta: Normal caliber. No aneurysm or dissection. Extensive aortic atherosclerotic calcification. No periaortic inflammatory change identified. Celiac: Widely patent. No aneurysm or dissection. Classic anatomic configuration. SMA: Widely patent.  No aneurysm or dissection. Renals: Single renal arteries bilaterally. Less than 50% stenosis of the right renal artery at its origin. Wide patency of the left renal artery. Normal vascular morphology. No aneurysm. IMA: Widely patent. Inflow: Extensive atherosclerotic calcification. No evidence of hemodynamically significant stenosis. No aneurysm or dissection. Internal iliac arteries are patent bilaterally. Proximal Outflow: Extensive atherosclerotic calcification results in 50% stenosis of the left common femoral artery. Extensive calcification noted within the visualized superficial femoral arteries bilaterally. Veins: Unremarkable Review of the MIP images confirms the above findings. NON-VASCULAR Lower chest: Tiny left pleural effusion. Mild bibasilar atelectasis. Moderate right coronary artery calcification. Global cardiac size is within normal limits. Hepatobiliary: Mild hepatic steatosis. No enhancing liver lesion. Gallbladder  unremarkable. No intra or extrahepatic biliary ductal dilation. Pancreas: Unremarkable Spleen: Unremarkable Adrenals/Urinary Tract: The adrenal glands are unremarkable. The kidneys are normal. Mild bilateral distal hydroureter. The bladder is circumferentially markedly thickened and there is moderate perivesicular inflammatory stranding in keeping with changes of infectious or inflammatory cystitis. The bladder is not distended. A Foley catheter balloon is seen inflated within the prostatic urethra. Stomach/Bowel: Moderate left inguinal hernia contains a single loop of unremarkable sigmoid colon. The stomach, small bowel, and large bowel are otherwise unremarkable. Appendix normal. 20 mm nodule within the a mesentery within the left lower quadrant adjacent to the descending colon likely represents a remote omental infarct. No free intraperitoneal gas or fluid. Lymphatic: No pathologic adenopathy within the abdomen and pelvis. Reproductive: Brachytherapy seeds are seen within the prostate gland. Seminal vesicles are unremarkable. There is calcification of the vas deferens bilaterally. Other: Moderate presacral and retroperitoneal edema is present, possibly secondary to the inflammatory process within the pelvis. No discrete drainable fluid collection identified. Moderate stool within the rectum. Musculoskeletal: No lytic or blastic bone lesion. No acute bone abnormality. IMPRESSION: VASCULAR Wide patency of the mesenteric arterial and venous vasculature. No evidence of differential bowel perfusion. Extensive aortoiliac atherosclerotic calcification with less than 50% stenosis of the right renal artery origin. Extensive disease of the visualized lower extremity arterial outflow with  50% stenosis of the left common femoral artery. NON-VASCULAR Foley catheter balloon inflated within the prostatic urethra. Marked circumferential bladder wall thickening and extensive perivesicular inflammatory stranding in keeping with  infectious or inflammatory cystitis. The bladder is not distended. Moderate retroperitoneal edema within the pelvis may be reactive in nature. Moderate left inguinal hernia containing and unremarkable loop of sigmoid colon Mild hepatic steatosis Aortic Atherosclerosis (ICD10-I70.0). Electronically Signed   By: Fidela Salisbury MD   On: 11/27/2020 02:08    Assessment & Plan:   Steven Howard was seen today for medical management of chronic issues.  Diagnoses and all orders for this visit:  Essential hypertension -     CBC with Differential/Platelet -     CMP14+EGFR  Mixed hyperlipidemia -     Lipid panel  Other orders -     albuterol (VENTOLIN HFA) 108 (90 Base) MCG/ACT inhaler; Inhale 2 puffs into the lungs every 4 (four) hours as needed for wheezing or shortness of breath (cough, shortness of breath or wheezing.). -     fluticasone furoate-vilanterol (BREO ELLIPTA) 200-25 MCG/ACT AEPB; Inhale 1 puff into the lungs daily.  I have discontinued Steven Howard's ipratropium-albuterol, nicotine, metoprolol tartrate, potassium chloride SA, Breo Ellipta, furosemide, and diclofenac Sodium. I am also having him start on fluticasone furoate-vilanterol. Additionally, I am having him maintain his multivitamin with minerals, saccharomyces boulardii, mupirocin cream, folic acid, melatonin, thiamine, and albuterol.  Meds ordered this encounter  Medications   albuterol (VENTOLIN HFA) 108 (90 Base) MCG/ACT inhaler    Sig: Inhale 2 puffs into the lungs every 4 (four) hours as needed for wheezing or shortness of breath (cough, shortness of breath or wheezing.).    Dispense:  1 each    Refill:  11   fluticasone furoate-vilanterol (BREO ELLIPTA) 200-25 MCG/ACT AEPB    Sig: Inhale 1 puff into the lungs daily.    Dispense:  1 each    Refill:  11     Follow-up: Return in about 6 months (around 12/30/2021).  Claretta Fraise, M.D.

## 2021-07-05 ENCOUNTER — Encounter: Payer: Self-pay | Admitting: Family Medicine

## 2021-07-16 DIAGNOSIS — S62310A Displaced fracture of base of second metacarpal bone, right hand, initial encounter for closed fracture: Secondary | ICD-10-CM | POA: Diagnosis not present

## 2021-07-16 DIAGNOSIS — S62309A Unspecified fracture of unspecified metacarpal bone, initial encounter for closed fracture: Secondary | ICD-10-CM | POA: Diagnosis not present

## 2021-08-18 ENCOUNTER — Telehealth: Payer: Self-pay | Admitting: Family Medicine

## 2021-08-18 NOTE — Telephone Encounter (Signed)
Left message for patient to call back and schedule Medicare Annual Wellness Visit (AWV) to be completed by video or phone.   Last AWV: 11/02/2019  Please schedule at anytime with [place]  45 minute appointment  Any questions, please contact me at [phone]

## 2021-09-09 ENCOUNTER — Ambulatory Visit (INDEPENDENT_AMBULATORY_CARE_PROVIDER_SITE_OTHER): Payer: Medicare HMO | Admitting: Family Medicine

## 2021-09-09 ENCOUNTER — Encounter: Payer: Self-pay | Admitting: Family Medicine

## 2021-09-09 VITALS — BP 106/72 | HR 95 | Temp 97.8°F

## 2021-09-09 DIAGNOSIS — K4090 Unilateral inguinal hernia, without obstruction or gangrene, not specified as recurrent: Secondary | ICD-10-CM | POA: Diagnosis not present

## 2021-09-09 NOTE — Progress Notes (Signed)
Subjective:  Patient ID: Steven Howard, male    DOB: 15-Mar-1951  Age: 71 y.o. MRN: [id number]  CC: Referral   HPIleft ing DVANTE HANDS presents for increasing pain from the hernia previously diagnosed. Located at left inguinal area. It drops into the scrotum intermittently with BM or urination. Reduceable.   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   He has a past surgical history that includes Prostate surgery (2011) and Cystoscopy with urethral dilatation (N/A, 06/27/2018).   His family history includes Alcohol abuse in his brother; COPD in his brother; Heart disease in his maternal grandmother; Stroke in his mother.He reports that he has been smoking cigars and cigarettes. He has a 13.75 pack-year smoking history. He has never used smokeless tobacco. He reports current alcohol use. He reports that he does not use drugs.    ROS Review of Systems  Genitourinary:  Positive for scrotal swelling and testicular pain.   Objective:  BP 106/72    Pulse 95    Temp 97.8 F (36.6 C)    SpO2 93%   BP Readings from Last 3 Encounters:  09/09/21 106/72  03/10/21 (!) 80/52  01/01/21 (!) 86/60    Wt  Readings from Last 3 Encounters:  01/01/21 201 lb 12.8 oz (91.5 kg)  11/26/20 200 lb (90.7 kg)  07/03/20 210 lb 3.2 oz (95.3 kg)     Physical Exam Abdominal:     Hernia: A hernia is present. Hernia is present in the left inguinal area. There is no hernia in the right inguinal area.  Genitourinary:    Testes: Normal.        Right: Testicular hydrocele not present.        Left: Testicular hydrocele not present.      Assessment & Plan:   Federico was seen today for referral.  Diagnoses and all orders for this visit:  Left inguinal hernia -     Ambulatory referral to General Surgery       I am having [name] maintain his multivitamin with minerals, saccharomyces boulardii, mupirocin cream, folic acid, melatonin, thiamine, albuterol, and fluticasone furoate-vilanterol.  Allergies as of 09/09/2021   No Known Allergies      Medication List        Accurate as of September 09, 2021 11:59 PM. If you have any questions, ask your nurse or doctor.          albuterol 108 (90 Base) MCG/ACT inhaler Commonly known as: VENTOLIN HFA Inhale 2 puffs into the lungs every 4 (four) hours as needed for wheezing or shortness of breath (cough, shortness of breath or wheezing.).   fluticasone furoate-vilanterol 200-25 MCG/ACT Aepb Commonly  known as: Breo Ellipta Inhale 1 puff into the lungs daily.   folic acid 1 MG tablet Commonly known as: FOLVITE Take 1 tablet (1 mg total) by mouth daily.   melatonin 3 MG Tabs tablet Take 1 tablet (3 mg total) by mouth at bedtime.   multivitamin with minerals Tabs tablet Take 1 tablet by mouth daily.   mupirocin cream 2 % Commonly known as: Bactroban Apply to affected area on his back 3 times daily   saccharomyces boulardii 250 MG capsule Commonly known as: Florastor Take 1 capsule (250 mg total) by mouth 2 (two) times daily.   thiamine 100 MG tablet Take 1 tablet (100 mg total) by mouth daily.         Follow-up: Return if  symptoms worsen or fail to improve.  Claretta Fraise, M.D.

## 2021-09-12 ENCOUNTER — Encounter: Payer: Self-pay | Admitting: Family Medicine

## 2021-09-17 ENCOUNTER — Other Ambulatory Visit: Payer: Self-pay

## 2021-09-17 ENCOUNTER — Other Ambulatory Visit (HOSPITAL_COMMUNITY): Payer: Self-pay | Admitting: Surgery

## 2021-09-17 ENCOUNTER — Ambulatory Visit: Payer: Medicare HMO | Admitting: Surgery

## 2021-09-17 ENCOUNTER — Encounter: Payer: Self-pay | Admitting: Surgery

## 2021-09-17 VITALS — BP 131/87 | HR 93 | Temp 98.4°F | Resp 14 | Ht 72.0 in | Wt 180.0 lb

## 2021-09-17 DIAGNOSIS — K4090 Unilateral inguinal hernia, without obstruction or gangrene, not specified as recurrent: Secondary | ICD-10-CM | POA: Diagnosis not present

## 2021-09-17 DIAGNOSIS — Q2730 Arteriovenous malformation, site unspecified: Secondary | ICD-10-CM

## 2021-09-17 NOTE — Progress Notes (Signed)
[facility] History and Physical  Reason for Referral: Left inguinal hernia Referring Physician: Claretta Fraise, MD  Chief Complaint   New Patient (Initial Visit)     Steven Howard is a 71 y.o. male.  HPI: Patient presents to have his left inguinal hernia evaluated.  He states that a few months ago, he noted a little bit of pain in his left groin region, and then when he followed up with his PCP, he pushed on the area a lot and it caused significant pain.  He has been having pain in his left groin since that time.  He has noted a bulge, but denies ever trying to push it back in.  He denies nausea, vomiting, and trouble with bowel movements.  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  He did follow-up with an interventional radiologist in October 2020, who wanted to perform an embolization of the AVM, but the patient did not schedule it at this time because he wanted to think about the procedure more.  His surgical history is significant for a prostate surgery for history of prostate cancer.  He denies use of blood thinning medications.  He smokes 1 to 1-1/2 packs/day and has done so for many decades.  He also drinks half a pint a day.  He denies illegal drug use.  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   Community acquired pneumonia 11/27/2020   COPD (chronic obstructive pulmonary disease) ([place])    Glaucoma    Hypoxia 06/20/2020   Lactic acidosis 11/27/2020   Recurrent major depressive disorder, in partial remission ([place]) 04/18/2018   Sleep apnea     Past Surgical History:  Procedure Laterality Date   CYSTOSCOPY WITH URETHRAL DILATATION N/A 06/27/2018   Procedure: CYSTOSCOPY WITH BALLOON DILATATION OF URETHRAL STRICTURE;  Surgeon: Franchot Gallo, MD;  Location: [facility];  Service: Urology;  Laterality: N/A;   PROSTATE SURGERY  2011   seeds implanted/radiation    Family History  Problem Relation Age of Onset   Stroke Mother    Alcohol abuse Brother    Heart disease Maternal Grandmother    COPD Brother     Social History   Tobacco Use   Smoking status: Every Day    Packs/day: 0.25    Years: 55.00    Pack years: 13.75    Types: Cigars, Cigarettes   Smokeless tobacco: Never  Vaping Use   Vaping Use: Never used  Substance Use Topics   Alcohol use: Yes    Comment: occasional   Drug use: Never    Medications: I have reviewed the patient's current medications. Allergies as of 09/17/2021   No Known Allergies      Medication List        Accurate as of September 17, 2021 11:35 AM. If you have any questions, ask your nurse or doctor.          albuterol 108 (90 Base) MCG/ACT inhaler Commonly known as: VENTOLIN HFA Inhale 2 puffs into the lungs every 4 (four) hours as needed for wheezing or shortness of breath (cough, shortness of breath or wheezing.).   fluticasone furoate-vilanterol 200-25 MCG/ACT Aepb Commonly known as: Breo Ellipta Inhale 1 puff into the lungs daily.   folic acid 1 MG  tablet Commonly known as: FOLVITE Take 1 tablet (1 mg total) by mouth daily.   melatonin 3 MG Tabs tablet Take 1 tablet (3 mg total) by mouth at bedtime.   multivitamin with minerals Tabs tablet Take 1 tablet by mouth daily.   mupirocin cream 2 % Commonly known as: Bactroban Apply to affected area on his back 3 times daily   saccharomyces boulardii 250 MG capsule Commonly known as: Florastor Take 1 capsule (250 mg total) by mouth 2 (two) times daily.   thiamine 100 MG tablet Take 1 tablet (100 mg total) by mouth daily.         ROS:  Constitutional: negative for chills, fatigue, and fevers Eyes: positive for blurred vision, negative for double vision Ears, nose, mouth, throat, and face: negative for ear  drainage, sore throat, and sinus problems Respiratory: positive for cough and shortness of breath, negative for wheezing Cardiovascular: negative for chest pain and palpitations Gastrointestinal: negative for abdominal pain, constipation, diarrhea, nausea, and vomiting Genitourinary:positive for frequency, negative for dysuria Integument/breast: positive for dryness, negative for rash and boils Hematologic/lymphatic: negative for bleeding and lymphadenopathy Musculoskeletal:positive for joint pain, negative for back pain and neck pain Neurological: positive for dizziness, negative for tremors and weakness Endocrine: positive for temperature intolerance, negative for excessive thirst, and diabetes  Blood pressure 131/87, pulse 93, temperature 98.4 F (36.9 C), temperature source Other (Comment), resp. rate 14, height 6' (1.829 m), weight 180 lb (81.6 kg), SpO2 90 %. Physical Exam Vitals reviewed.  Constitutional:      Appearance: Normal appearance.  HENT:     Head: Normocephalic and atraumatic.  Eyes:     Extraocular Movements: Extraocular movements intact.     Pupils: Pupils are equal, round, and reactive to light.  Cardiovascular:     Rate and Rhythm: Normal rate and regular rhythm.  Pulmonary:     Effort: Pulmonary effort is normal.     Comments: Coarse breath sounds bilaterally Abdominal:     General: There is no distension.     Palpations: Abdomen is soft.     Tenderness: There is no abdominal tenderness. There is no guarding or rebound.  Genitourinary:    Testes: Normal.     Comments: Left inguinal hernia present with palpable bulge, soft and reducible, though tender to palpation; no right inguinal hernia palpated Musculoskeletal:        General: Normal range of motion.     Cervical back: Normal range of motion.  Skin:    General: Skin is warm and dry.  Neurological:     General: No focal deficit present.     Mental Status: He is alert and oriented to person, place, and  time.  Psychiatric:        Mood and Affect: Mood normal.        Behavior: Behavior normal.    Results: No results found for this or any previous visit (from the past 48 hour(s)).  No results found.   Assessment & Plan:  Steven Howard is a 71 y.o. male who presents with a left inguinal hernia  -Discussed the patient with anesthesia at [facility], and both of [place] agree that the patient should undergo embolization of his brain AVM prior to repair of his left inguinal hernia given the risk for possible rupture and bleed during induction or during extubation -I discussed with the patient that while he has pain associated with his inguinal hernia, the risk of causing a problem with his AVM during  the elective surgery is high, and I would strongly recommend he undergo embolization of his AVM prior. -Patient scheduled to see interventional radiology at [facility] on 1/24 to discuss embolization and schedule the procedure -Discussed the risk and benefits including, bleeding, infection, use of mesh, risk of recurrence, risk of nerve damage causing numbness or changes in sensation, risk of damage to the cord structures. The patient understands the risk and benefits of repair with mesh, and has decided to proceed.  We will plan to schedule him for his left inguinal hernia repair after he undergoes embolization for his age. -Discussed need for more urgent evaluation regarding his inguinal hernia, including nausea, vomiting, obstipation, erythema or skin changes overlying hernia, and significantly increased tenderness without ability to reduce the hernia.   All questions were answered to the satisfaction of the patient and family.   Graciella Freer, DO [facility] [address] [place], [place] [address] [phone] (office)

## 2021-09-22 ENCOUNTER — Other Ambulatory Visit: Payer: Self-pay

## 2021-09-22 ENCOUNTER — Ambulatory Visit (HOSPITAL_COMMUNITY)
Admission: RE | Admit: 2021-09-22 | Discharge: 2021-09-22 | Disposition: A | Discharge status: Home / Self Care | Payer: Medicare HMO | Source: Ambulatory Visit | Attending: Surgery | Admitting: Surgery

## 2021-09-22 DIAGNOSIS — Q2730 Arteriovenous malformation, site unspecified: Secondary | ICD-10-CM

## 2021-09-22 DIAGNOSIS — Q282 Arteriovenous malformation of cerebral vessels: Secondary | ICD-10-CM | POA: Diagnosis not present

## 2021-09-25 HISTORY — PX: IR RADIOLOGIST EVAL & MGMT: IMG5224

## 2021-09-30 ENCOUNTER — Other Ambulatory Visit (HOSPITAL_COMMUNITY): Payer: Self-pay | Admitting: Interventional Radiology

## 2021-09-30 DIAGNOSIS — Q2730 Arteriovenous malformation, site unspecified: Secondary | ICD-10-CM

## 2021-10-08 ENCOUNTER — Other Ambulatory Visit: Payer: Self-pay | Admitting: Radiology

## 2021-10-09 ENCOUNTER — Other Ambulatory Visit: Payer: Self-pay | Admitting: Internal Medicine

## 2021-10-09 ENCOUNTER — Ambulatory Visit (HOSPITAL_COMMUNITY): Admission: RE | Admit: 2021-10-09 | Payer: Medicare HMO | Source: Ambulatory Visit

## 2021-10-12 ENCOUNTER — Ambulatory Visit (HOSPITAL_COMMUNITY)
Admission: RE | Admit: 2021-10-12 | Discharge: 2021-10-12 | Disposition: A | Discharge status: Home / Self Care | Payer: Medicare HMO | Source: Ambulatory Visit | Attending: Interventional Radiology | Admitting: Interventional Radiology

## 2021-10-12 ENCOUNTER — Other Ambulatory Visit (HOSPITAL_COMMUNITY): Payer: Self-pay | Admitting: Interventional Radiology

## 2021-10-12 ENCOUNTER — Other Ambulatory Visit: Payer: Self-pay

## 2021-10-12 DIAGNOSIS — J449 Chronic obstructive pulmonary disease, unspecified: Secondary | ICD-10-CM | POA: Insufficient documentation

## 2021-10-12 DIAGNOSIS — H409 Unspecified glaucoma: Secondary | ICD-10-CM | POA: Diagnosis not present

## 2021-10-12 DIAGNOSIS — Q2730 Arteriovenous malformation, site unspecified: Secondary | ICD-10-CM

## 2021-10-12 DIAGNOSIS — F17210 Nicotine dependence, cigarettes, uncomplicated: Secondary | ICD-10-CM | POA: Diagnosis not present

## 2021-10-12 DIAGNOSIS — I6502 Occlusion and stenosis of left vertebral artery: Secondary | ICD-10-CM | POA: Diagnosis not present

## 2021-10-12 DIAGNOSIS — I6523 Occlusion and stenosis of bilateral carotid arteries: Secondary | ICD-10-CM | POA: Insufficient documentation

## 2021-10-12 DIAGNOSIS — F32A Depression, unspecified: Secondary | ICD-10-CM | POA: Insufficient documentation

## 2021-10-12 DIAGNOSIS — Z8546 Personal history of malignant neoplasm of prostate: Secondary | ICD-10-CM | POA: Diagnosis not present

## 2021-10-12 DIAGNOSIS — Q282 Arteriovenous malformation of cerebral vessels: Secondary | ICD-10-CM | POA: Diagnosis not present

## 2021-10-12 HISTORY — PX: IR ANGIO INTRA EXTRACRAN SEL COM CAROTID INNOMINATE BILAT MOD SED: IMG5360

## 2021-10-12 HISTORY — PX: IR US GUIDE VASC ACCESS RIGHT: IMG2390

## 2021-10-12 HISTORY — PX: IR ANGIO VERTEBRAL SEL SUBCLAVIAN INNOMINATE BILAT MOD SED: IMG5366

## 2021-10-12 LAB — BASIC METABOLIC PANEL
Anion gap: 8 (ref 5–15)
BUN: 6 mg/dL — ABNORMAL LOW (ref 8–23)
CO2: 30 mmol/L (ref 22–32)
Calcium: 9.2 mg/dL (ref 8.9–10.3)
Chloride: 93 mmol/L — ABNORMAL LOW (ref 98–111)
Creatinine, Ser: 0.66 mg/dL (ref 0.61–1.24)
GFR, Estimated: >60 mL/min (ref >60)
Glucose, Bld: 95 mg/dL (ref 70–99)
Potassium: 4.2 mmol/L (ref 3.5–5.1)
Sodium: 131 mmol/L — ABNORMAL LOW (ref 135–145)

## 2021-10-12 LAB — CBC
HCT: 48.6 % (ref 39.0–52.0)
Hemoglobin: 17.3 g/dL — ABNORMAL HIGH (ref 13.0–17.0)
MCH: 37 pg — ABNORMAL HIGH (ref 26.0–34.0)
MCHC: 35.6 g/dL (ref 30.0–36.0)
MCV: 103.8 fL — ABNORMAL HIGH (ref 80.0–100.0)
Platelets: 227 10*3/uL (ref 150–400)
RBC: 4.68 MIL/uL (ref 4.22–5.81)
RDW: 16.4 % — ABNORMAL HIGH (ref 11.5–15.5)
WBC: 6 10*3/uL (ref 4.0–10.5)
nRBC: 0 % (ref 0.0–0.2)

## 2021-10-12 LAB — PROTIME-INR
INR: 0.9 (ref 0.8–1.2)
Prothrombin Time: 12 seconds (ref 11.4–15.2)

## 2021-10-12 MED ORDER — SODIUM CHLORIDE 0.9 % IV SOLN: INTRAVENOUS | Status: AC

## 2021-10-12 MED ORDER — MIDAZOLAM HCL 2 MG/2ML IJ SOLN
INTRAMUSCULAR | Status: AC | PRN
  Administered 2021-10-12: 1 mg via INTRAVENOUS

## 2021-10-12 MED ORDER — MIDAZOLAM HCL 2 MG/2ML IJ SOLN
INTRAMUSCULAR | Status: AC
  Filled 2021-10-12: qty 2

## 2021-10-12 MED ORDER — SODIUM CHLORIDE 0.9 % IV SOLN: Freq: Once | INTRAVENOUS | Status: AC

## 2021-10-12 MED ORDER — HEPARIN SODIUM (PORCINE) 1000 UNIT/ML IJ SOLN
INTRAMUSCULAR | Status: AC
  Filled 2021-10-12: qty 10

## 2021-10-12 MED ORDER — FENTANYL CITRATE (PF) 100 MCG/2ML IJ SOLN
INTRAMUSCULAR | Status: AC | PRN
  Administered 2021-10-12: 25 ug via INTRAVENOUS

## 2021-10-12 MED ORDER — NITROGLYCERIN 1 MG/10 ML FOR IR/CATH LAB
INTRA_ARTERIAL | Status: AC
  Filled 2021-10-12: qty 10

## 2021-10-12 MED ORDER — LIDOCAINE HCL 1 % IJ SOLN
INTRAMUSCULAR | Status: AC
  Filled 2021-10-12: qty 20

## 2021-10-12 MED ORDER — VERAPAMIL HCL 2.5 MG/ML IV SOLN: INTRA_ARTERIAL | Status: AC | PRN

## 2021-10-12 MED ORDER — VERAPAMIL HCL 2.5 MG/ML IV SOLN
INTRAVENOUS | Status: AC
  Filled 2021-10-12: qty 2

## 2021-10-12 MED ORDER — FENTANYL CITRATE (PF) 100 MCG/2ML IJ SOLN
INTRAMUSCULAR | Status: AC
  Filled 2021-10-12: qty 2

## 2021-10-12 MED ORDER — IOHEXOL 300 MG/ML  SOLN
100.0000 mL | Freq: Once | INTRAMUSCULAR | Status: AC | PRN
  Administered 2021-10-12: 75 mL via INTRA_ARTERIAL

## 2021-10-12 NOTE — Sedation Documentation (Signed)
8cc of air applied to right TR band at 1105.

## 2021-10-12 NOTE — H&P (Signed)
Chief Complaint: Patient was seen in consultation today for dizziness and vertigo  Referring Physician(s): Pappayliou, Barnetta Chapel, DO  Supervising Physician: Luanne Bras  Patient Status: [name] - Out-pt  History of Present Illness: Steven Howard is a 71 y.o. male with a past medical history significant for depression, glaucoma, prostate cancer, COPD and intracranial right-sided temporal AV malformation who presents today for diagnostic cerebral angiogram with moderate sedation. [name] was previously seen by Dr. Estanislado Pandy on 06/13/19 to discuss right sided temporal AV malformation, it was recommended at that time that he undergo a diagnostic cerebral angiogram for further evaluation however he declined. Since that time he has had worsening dizziness (room spinning) especially when sitting up from laying down or standing up from sitting. He was seen again by Dr. Bronson Curb on 09/22/21 and again cerebral angiogram was recommended which he has agreed to and presents for today.  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   Community acquired pneumonia 11/27/2020   COPD (chronic obstructive pulmonary disease) ([place])    Glaucoma    Hypoxia 06/20/2020   Lactic acidosis 11/27/2020   Recurrent major depressive disorder, in partial remission ([name]) 04/18/2018   Sleep apnea     Past Surgical History:  Procedure Laterality Date   CYSTOSCOPY WITH URETHRAL DILATATION N/A 06/27/2018   Procedure: CYSTOSCOPY WITH BALLOON DILATATION OF URETHRAL STRICTURE;  Surgeon: Franchot Gallo, MD;  Location: [facility];  Service: Urology;  Laterality: N/A;   IR RADIOLOGIST EVAL & MGMT  09/25/2021   PROSTATE SURGERY  2011   seeds implanted/radiation    Allergies: Patient has no known allergies.  Medications: Prior to Admission medications   Medication Sig Start Date End Date  Taking? Authorizing Provider  albuterol (VENTOLIN HFA) 108 (90 Base) MCG/ACT inhaler Inhale 2 puffs into the lungs every 4 (four) hours as needed for wheezing or shortness of breath (cough, shortness of breath or wheezing.). 07/02/21  Yes Claretta Fraise, MD  b complex vitamins capsule Take 1 capsule by mouth daily.   Yes [provider]  fluticasone furoate-vilanterol (BREO ELLIPTA) 200-25 MCG/ACT AEPB Inhale 1 puff into the lungs daily. Patient not taking: Reported on 10/06/2021 07/02/21   Claretta Fraise, MD     Family History  Problem Relation Age of Onset   Stroke Mother    Alcohol abuse Brother    Heart disease Maternal Grandmother    COPD Brother     Social History   Socioeconomic History   Marital status: Widowed    Spouse name: Not on file   Number of children: 2   Years of education: 12   Highest education level: High school graduate  Occupational History   Occupation: Retired  Tobacco Use   Smoking status: Every Day    Packs/day: 0.25    Years: 55.00    Pack years: 13.75    Types: Cigars, Cigarettes   Smokeless tobacco: Never  Vaping Use   Vaping Use: Never used  Substance and Sexual Activity   Alcohol use: Yes    Comment: occasional   Drug use: Never   Sexual activity: Not Currently  Other Topics Concern   Not on file  Social History Narrative   Not on file   Social Determinants of Health   Financial Resource Strain: Not on file  Food Insecurity: Not on file  Transportation Needs: Not on file  Physical Activity: Not on file  Stress: Not on file  Social Connections: Not on file     Review of Systems: A 12 point ROS discussed and pertinent positives are indicated in the HPI above.  All other systems are negative.  Review of Systems  Constitutional:  Negative for chills and fever.  Respiratory:  Negative for cough and shortness of breath.   Cardiovascular:  Negative for chest pain.  Gastrointestinal:  Negative for abdominal pain, diarrhea,  nausea and vomiting.  Musculoskeletal:  Negative for back pain.  Neurological:  Positive for dizziness. Negative for tremors, facial asymmetry, speech difficulty, weakness, light-headedness, numbness and headaches.  Psychiatric/Behavioral:  Negative for confusion.    Vital Signs: BP (!) 86/58    Pulse 92    Temp 98 F (36.7 C) (Oral)    Resp 16    Ht 6' (1.829 m)    Wt 180 lb (81.6 kg)    SpO2 92%    BMI 24.41 kg/m   Physical Exam Vitals reviewed.  Constitutional:      General: He is not in acute distress. HENT:     Head: Normocephalic.     Mouth/Throat:     Mouth: Mucous membranes are moist.     Pharynx: Oropharynx is clear. No oropharyngeal exudate or posterior oropharyngeal erythema.  Cardiovascular:     Rate and Rhythm: Normal rate and regular rhythm.  Pulmonary:     Effort: Pulmonary effort is normal.     Breath sounds: Normal breath sounds.  Abdominal:     General: There is no distension.     Palpations: Abdomen is soft.     Tenderness: There is no abdominal tenderness.  Skin:    General: Skin is warm and dry.  Neurological:     Mental Status: He is alert.  Alert, awake, and oriented x 4 Speech and comprehension intact PER bilaterally EOMs without nystagmus or subjective diplopia. Visual fields grossly in tact No facial asymmetry. Tongue midline Motor power full all 4 extremities Fine motor and coordination in tact   MD Evaluation Airway: WNL Heart: WNL Abdomen: WNL Chest/ Lungs: WNL ASA  Classification: 2 Mallampati/Airway Score: One   Imaging: IR Radiologist Eval & Mgmt  Result Date: 09/25/2021 EXAM: ESTABLISHED PATIENT OFFICE VISIT CHIEF COMPLAINT: Dizziness and vertigo.  History of brain vascular malformation. Current Pain Level: 1-10 HISTORY OF PRESENT ILLNESS: 71 year old right handed gentleman who has been referred for evaluation and management of known intracranial right-sided temporal arteriovenous malformation. Patient was seen in consultation on  06/13/2019 diagnostic cerebral arteriogram was scheduled. However, the patient decided at that time not to proceed with the diagnostic catheter cerebral arteriogram. Patient has been referred again for the same. Patient is accompanied by the son. Clinically, the patient's main symptoms are worsening dizziness which the patient likens to the room spinning especially when the patient sits up from a lying position or standing up from a sitting position. The symptoms last only a few minutes. They dissipate with the patient lying down. There are no associated symptoms of nausea, vomiting, visual changes, loss of consciousness, or seizure-like activity. Patient declines any motor weakness associated with these symptoms. Patient denies any involuntary jerking of limbs associated with these episodes as well. Most recently, the patient apparently fell during a similar spell fracturing his right upper extremity. Patient reports wheeziness related to COPD on brisk walking. He denies any recent chest pain, shortness of breath, otherwise, palpitations or pedal edema. Maintains his appetite is normal, weight is steady.  Denies any difficulty swallowing liquids or solids, or of abdominal pain, constipation, diarrhea. No history of melena. Patient reports no recent symptoms of dysuria, hematuria or of polyuria. However, he is able to cope with his daily routine activities without any difficulties. Patient ambulates with a cane as a protection against falling related to the dizziness. Diagnosis * : Date . * : Acute metabolic encephalopathy * : 11/27/2020 . * : Cancer ([place]) * : 2011 * : Prostate . * : Circadian rhythm sleep disorder, shift work type * : 05/19/2018 . * : Community acquired pneumonia * : 11/27/2020 . * : COPD (chronic obstructive pulmonary disease) ([place]) * : . * : Glaucoma * : . * : Hypoxia * : 06/20/2020 . * : Lactic acidosis * : 11/27/2020 . * : Recurrent major depressive disorder, in partial remission ([place]) * : 04/18/2018  . * : Sleep apnea * : Past Surgical History: Procedure * : Laterality * : Date . * : CYSTOSCOPY WITH URETHRAL DILATATION * : N/A * : 06/27/2018 * : Procedure: CYSTOSCOPY WITH BALLOON DILATATION OF URETHRAL STRICTURE; Surgeon: Franchot Gallo, MD; Location: [facility]; Service: Urology; Laterality: N/A; . * : PROSTATE SURGERY * : * : 2011 * : seeds implanted/radiation Family History Problem * : Relation * : Age of Onset . * : Stroke * : Mother * : . * : Alcohol abuse * : Brother * : . * : Heart disease * : Maternal Grandmother * : . * : COPD * : Brother * : Tobacco Use . * : Smoking status: * : Every Day * : * : Packs/day: * : 0.25 * : * : Years: * : 55.00 * : * : Pack years: * : 13.75 * : * : Types: * : Cigars, Cigarettes . * : Smokeless tobacco: * : Never Vaping Use . * : Vaping Use: * : Never used Substance Use Topics . * : Alcohol use: * : Yes * : * : Comment: occasional . * : Drug use: * : Never No known allergies. Medications: Albuterol 108 (90 Base) MCG/ACT inhaler commonly known as: VENTOLIN HFA inhale 2 puffs into the lungs every 4 (four) hours as needed for wheezing or shortness of breath (cough, shortness of breath or wheezing.).Fluticasone furoate-vilanterol 200-25 MCG/ACT Aepb commonly known as: Breo Ellipta inhale 1 puff into the lungs daily. Folic acid 1 MG tablet commonly known as: FOLVITE take 1 tablet (1 mg total) by mouth daily. Melatonin 3 MG Tabs tablet take 1 tablet (3 mg total) by mouth at bedtime. Multivitamin with minerals Tabs tablet take 1 tablet by mouth daily. Mupirocin cream 2 % commonly known as: Bactroban apply to affected area on his back 3 times daily saccharomyces boulardii 250 MG capsule commonly known as: Florastor take 1 capsule (250 mg total) by mouth 2 (two) times daily. Thiamine 100 MG tablet take 1 tablet (100 mg total) by mouth daily. REVIEW OF SYSTEMS: Negative unless as mentioned. PHYSICAL EXAMINATION: Patient in a wheelchair with no acute distress. Affect appropriate.  Normal eye contact. Speech and comprehension intact. No gross lateralizing neurologic abnormalities identified. Gait not tested. ASSESSMENT AND PLAN: The patient's MRI of May 25, 2019, and CT of the head of November 26, 2020 was reviewed. Given the patient's worsening symptoms of vertigo and dizziness, it was suggested that the patient undergo a formal diagnostic catheter arteriogram in order to accurately clarify the angio architecture of the right temporal arterials malformation and also evaluate for  arteriovenous shunting which may be contributing to the patient's worsening dizziness. Patient and son were informed that the procedure would be performed either through the radial or femoral approach with conscious sedation. It will be a day procedure following which the patient will return home after 3-4 hours. Further management consideration would be discussed following diagnostic catheter arteriogram. The patient expressed his desire to proceed with a diagnostic catheter arteriogram. This will be scheduled as soon as possible. Patient and the son expressed clear understanding and agreement with the above management plan. Electronically Signed   By: Luanne Bras M.D.   On: 09/25/2021 08:07    Labs:  CBC: Recent Labs    12/01/20 0429 01/01/21 1129 07/02/21 1058 10/12/21 0827  WBC 6.1 7.5 6.6 6.0  HGB 16.6 16.1 18.3* 17.3*  HCT 50.0 47.7 52.5* 48.6  PLT 144* 218 206 227    COAGS: Recent Labs    11/26/20 1804  INR 1.0  APTT 30    BMP: Recent Labs    11/26/20 1740 11/27/20 0616 11/28/20 1903 12/01/20 0429 01/01/21 1129 07/02/21 1058  NA 132* 136 133* 136 135 132*  K 4.1 4.0 3.3* 3.1* 4.5 4.3  CL 90* 98 99 96* 95* 88*  CO2 30 24 27 31 27  31*  GLUCOSE 108* 67* 139* 83 79 138*  BUN 13 9 5* 5* 9 11  CALCIUM 8.3* 7.9* 8.1* 8.4* 9.5 9.4  CREATININE 0.64 0.52* 0.54* 0.45* 0.62* 0.75*  GFRNONAA >60 >60 >60 >60  --   --     LIVER FUNCTION TESTS: Recent Labs     11/27/20 0616 11/28/20 1903 01/01/21 1129 07/02/21 1058  BILITOT 0.9 0.9 0.6 1.0  AST 21 19 18 31   ALT 12 9 9 19   ALKPHOS 81 72 63 78  PROT 5.9* 5.6* 6.1 6.1  ALBUMIN 2.7* 2.5* 3.4* 3.8    TUMOR MARKERS: No results for input(s): AFPTM, CEA, CA199, CHROMGRNA in the last 8760 hours.  Assessment and Plan:  71 y/o M with known intracranial right AV malformation now with worsening dizziness/vertigo who presents today for diagnostic cerebral angiogram to further direct care.  Risks and benefits of cerebral angiogram were discussed with the patient including, but not limited to bleeding, infection, vascular injury, stroke, or contrast induced renal failure.  This interventional procedure involves the use of X-rays and because of the nature of the planned procedure, it is possible that we will have prolonged use of X-ray fluoroscopy. Potential radiation risks to you include (but are not limited to) the following: - A slightly elevated risk for cancer  several years later in life. This risk is typically less than 0.5% percent. This risk is low in comparison to the normal incidence of human cancer, which is 33% for women and 50% for men according to the [place]. - Radiation induced injury can include skin redness, resembling a rash, tissue breakdown / ulcers and hair loss (which can be temporary or permanent).  The likelihood of either of these occurring depends on the difficulty of the procedure and whether you are sensitive to radiation due to previous procedures, disease, or genetic conditions.  IF your procedure requires a prolonged use of radiation, you will be notified and given written instructions for further action.  It is your responsibility to monitor the irradiated area for the 2 weeks following the procedure and to notify your physician if you are concerned that you have suffered a radiation induced injury.    All of the  patient's questions were answered, patient is  agreeable to proceed.  Consent signed and in chart.  Thank you for this interesting consult.  I greatly enjoyed meeting BARTLOMIEJ JENKINSON and look forward to participating in their care.  A copy of this report was sent to the requesting provider on this date.  Electronically Signed: Joaquim Nam, PA-C 10/12/2021, 8:48 AM   I spent a total of  25 Minutes in face to face in clinical consultation, greater than 50% of which was counseling/coordinating care for diagnostic cerebral angiogram.

## 2021-10-12 NOTE — Procedures (Signed)
INR. S/P 4 vessel cerebral arteriogram Rt rad approach. Findings. 1.Aprox 60mm x 18 mm nidus of  a fast flow AVM involving the Rt inf temporal lobe  with venous outflow  along the inf tempotal fossa associated with prox venoud fusiform dilatation. 2Primary arterial feeder Rt PCA and RT PCO. 3. No evidence of intracranial aneurysms seen.  S.Stpehanie Montroy MD

## 2021-10-19 ENCOUNTER — Other Ambulatory Visit (HOSPITAL_COMMUNITY): Payer: Self-pay | Admitting: Interventional Radiology

## 2021-10-19 DIAGNOSIS — Q2730 Arteriovenous malformation, site unspecified: Secondary | ICD-10-CM

## 2021-10-21 ENCOUNTER — Ambulatory Visit (HOSPITAL_COMMUNITY): Admission: RE | Admit: 2021-10-21 | Payer: Medicare HMO | Source: Ambulatory Visit

## 2021-10-22 ENCOUNTER — Other Ambulatory Visit: Payer: Self-pay

## 2021-10-22 ENCOUNTER — Ambulatory Visit (HOSPITAL_COMMUNITY)
Admission: RE | Admit: 2021-10-22 | Discharge: 2021-10-22 | Disposition: A | Discharge status: Home / Self Care | Payer: Medicare HMO | Source: Ambulatory Visit | Attending: Interventional Radiology | Admitting: Interventional Radiology

## 2021-10-22 DIAGNOSIS — Z9889 Other specified postprocedural states: Secondary | ICD-10-CM | POA: Diagnosis not present

## 2021-10-22 DIAGNOSIS — Q282 Arteriovenous malformation of cerebral vessels: Secondary | ICD-10-CM | POA: Diagnosis not present

## 2021-10-22 DIAGNOSIS — Q2730 Arteriovenous malformation, site unspecified: Secondary | ICD-10-CM

## 2021-10-25 HISTORY — PX: IR RADIOLOGIST EVAL & MGMT: IMG5224

## 2021-11-03 ENCOUNTER — Other Ambulatory Visit (HOSPITAL_COMMUNITY): Payer: Self-pay | Admitting: Interventional Radiology

## 2021-11-03 DIAGNOSIS — Q2730 Arteriovenous malformation, site unspecified: Secondary | ICD-10-CM

## 2021-11-19 ENCOUNTER — Telehealth (HOSPITAL_COMMUNITY): Payer: Self-pay | Admitting: Radiology

## 2021-11-19 NOTE — Telephone Encounter (Signed)
Called pt to let him that the Lead testing policy has changed and he does NOT require that test prior to his procedure now. He states understanding. JM ?

## 2021-11-23 NOTE — H&P (Deleted)
  The note originally documented on this encounter has been moved the the encounter in which it belongs.  

## 2021-11-23 NOTE — H&P (Signed)
? ?Chief Complaint: ?Patient was seen in consultation today for cerebral angiogram with intervention at the request of Pappayliou, Barnetta Chapel, DO ? ?Referring Physician(s): Pappayliou, Barnetta Chapel, DO ? ?Supervising Physician: Luanne Bras ? ?Patient Status: [facility] - Out-pt ? ?History of Present Illness: ?Steven Howard is a 71 y.o. male with PMHs of depression, glaucoma, prostate cancer, COPD, and intracranial right-sided temporal AV malformation, who is known to Dr. Estanislado Pandy from previous consultation visit (09/25/21) and diagnostic cerebral angiogram (10/13/21,) and follow up visit (10/25/21.) ? 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 Patient referred to Dr. Estanislado Pandy in October 2020, but he ultimately declined the endovascular treatment.  ? ?Patient was referred to DR. Deveshwar again in January 2023 for the debilitating dizziness/vertigo, and patient underwent diagnostic cerebral angiogram on 10/13/21 which showed: ? ?1.Aprox 61m x 18 mm nidus of  a fast flow AVM involving the Rt inf temporal lobe  with venous outflow  along the inf tempotal fossa associated with prox venoud fusiform dilatation. ?2Primary arterial feeder Rt PCA and RT PCO. ?3. No evidence of intracranial aneurysms seen.  ? ?Treatment options for the AV malformation was discussed in detail by Dr. DEstanislado Pandyduring the follow up visit on 10/25/21. After thorough discussion and shared decision making, patient decided to proceed with endovascular embolization of the right posterior cerebral artery branches which is feeding the AVM. Patient was instructed to start taking aspirin 81 mg on 11/19/21.  ? ?[facility] [place] [facility] [place]  ? ?Patient laying in bed, not in acute distress.  RNs at bedside.  ?Reports chronic productive cough, due to his COPD.  ?Denise headache, fever, chills, shortness of breath, cough, chest pain, abdominal pain, nausea ,vomiting, and bleeding. ? ? ?Past Medical History:  ?Diagnosis Date  ? Acute metabolic encephalopathy 081/44/8185 ? [facility]  ? Prostate  ? Cerebral arteriovenous malformation (AVM)   ? 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 Community acquired pneumonia 11/27/2020  ? COPD [place] disease[place]   ? Glaucoma   ? Hypoxia 06/20/2020  ? Lactic acidosis 11/27/2020  ? Recurrent major depressive disorder, in partial remission ([place] 04/18/2018  ? Sleep apnea   ? use a cpap  ? ? ?Past Surgical History:  ?Procedure Laterality Date  ? CYSTOSCOPY WITH URETHRAL DILATATION N/A 06/27/2018  ? Procedure: CYSTOSCOPY WITH BALLOON DILATATION OF URETHRAL STRICTURE;  Surgeon: DFranchot Gallo MD;  Location: [facility];  Service: Urology;  Laterality: N/A;  ? IR ANGIO INTRA EXTRACRAN SEL COM CAROTID INNOMINATE BILAT MOD SED  10/12/2021  ? IR ANGIO VERTEBRAL SEL SUBCLAVIAN INNOMINATE BILAT MOD SED  10/12/2021  ? IR RADIOLOGIST EVAL & MGMT  09/25/2021  ? IR RADIOLOGIST EVAL & MGMT  10/25/2021  ? IR [place] ACCESS RIGHT  10/12/2021  ? PROSTATE SURGERY  2011  ? seeds implanted/radiation  ? ? ?Allergies: ?Patient has no known allergies. ? ?Medications: ?Prior to Admission medications   ?Medication Sig Start Date End Date Taking? Authorizing Provider  ?albuterol (VENTOLIN HFA) 108 (90 Base) MCG/ACT inhaler Inhale 2 puffs into the lungs every 4 (four) hours as needed for wheezing or shortness of breath (cough, shortness of breath or wheezing.). ?Patient taking differently: Inhale 1-2 puffs into the lungs every 4 (  four) hours as needed for wheezing or shortness of breath (cough, shortness of breath or wheezing.). 07/02/21  Yes Claretta Fraise, MD  ?b complex vitamins capsule Take 1 capsule by mouth 2 (two) times a week.   Yes  [provider]  ?fluticasone furoate-vilanterol (BREO ELLIPTA) 200-25 MCG/ACT AEPB Inhale 1 puff into the lungs daily. ?Patient not taking: Reported on 10/06/2021 07/02/21   Claretta Fraise, MD  ?  ? ?Family History  ?Problem Relation Age of Onset  ? Stroke Mother   ? Alcohol abuse Brother   ? Heart disease Maternal Grandmother   ? COPD Brother   ? ? ?Social History  ? ?Socioeconomic History  ? Marital status: Widowed  ?  Spouse name: Not on file  ? Number of children: 2  ? Years of education: 55  ? Highest education level: High school graduate  ?Occupational History  ? Occupation: Retired  ?Tobacco Use  ? Smoking status: Every Day  ?  Packs/day: 0.25  ?  Years: 55.00  ?  Pack years: 13.75  ?  Types: Cigarettes  ? Smokeless tobacco: Never  ?Vaping Use  ? Vaping Use: Never used  ?Substance and Sexual Activity  ? Alcohol use: Yes  ?  Comment: 11/24/21 -per pt "a couple of pints a week"  ? Drug use: Never  ? Sexual activity: Not Currently  ?Other Topics Concern  ? Not on file  ?Social History Narrative  ? Not on file  ? ?Social Determinants of Health  ? ?Financial Resource Strain: Not on file  ?Food Insecurity: Not on file  ?Transportation Needs: Not on file  ?Physical Activity: Not on file  ?Stress: Not on file  ?Social Connections: Not on file  ? ? ? ?Review of Systems: A 12 point ROS discussed and pertinent positives are indicated in the HPI above.  All other systems are negative. ? ?Vital Signs: ?There were no vitals taken for this visit. ? ? ?Physical Exam ?Vitals reviewed.  ?Constitutional:   ?   General: He is not in acute distress. ?   Appearance: Normal appearance.  ?HENT:  ?   Head: Normocephalic and atraumatic.  ?   Mouth/Throat:  ?   Mouth: Mucous membranes are moist.  ?   Pharynx: Oropharynx is clear.  ?Cardiovascular:  ?   Rate and Rhythm: Normal rate and regular rhythm.  ?   Heart sounds: Normal heart sounds.  ?Pulmonary:  ?   Effort: Pulmonary effort is normal.  ?   Breath sounds: Wheezing present.   ?   Comments: Wheezing on RUL ?Abdominal:  ?   General: Abdomen is flat. Bowel sounds are normal.  ?   Palpations: Abdomen is soft.  ?Musculoskeletal:  ?   Cervical back: Neck supple.  ?Skin: ?   General: Skin is warm and dry.  ?   Coloration: Skin is not jaundiced or pale.  ?Neurological:  ?   Mental Status: He is alert and oriented to person, place, and time.  ?Psychiatric:     ?   Mood and Affect: Mood normal.     ?   Behavior: Behavior normal.     ?   Judgment: Judgment normal.  ? ? ?  ? ?Imaging: ?No results found. ? ?Labs: ? ?CBC: ?Recent Labs  ?  01/01/21 ?1129 07/02/21 ?1058 10/12/21 ?0827 11/25/21 ?0705  ?WBC 7.5 6.6 6.0 4.9  ?HGB 16.1 18.3* 17.3* 16.8  ?HCT 47.7 52.5* 48.6 46.1  ?PLT 218 206 227 211  ? ? ?  COAGS: ?Recent Labs  ?  11/26/20 ?1804 10/12/21 ?0827 11/25/21 ?5521  ?INR 1.0 0.9 0.9  ?APTT 30  --   --   ? ? ?BMP: ?Recent Labs  ?  11/28/20 ?1903 12/01/20 ?0429 01/01/21 ?1129 07/02/21 ?1058 10/12/21 ?0827 11/25/21 ?0705  ?NA 133* 136 135 132* 131* 133*  ?K 3.3* 3.1* 4.5 4.3 4.2 4.0  ?CL 99 96* 95* 88* 93* 92*  ?CO2 '27 31 27 '$ 31* 30 31  ?GLUCOSE 139* 83 79 138* 95 83  ?BUN 5* 5* 9 11 6* 7*  ?CALCIUM 8.1* 8.4* 9.5 9.4 9.2 9.2  ?CREATININE 0.54* 0.45* 0.62* 0.75* 0.66 0.66  ?GFRNONAA >60 >60  --   --  >60 >60  ? ? ?LIVER FUNCTION TESTS: ?Recent Labs  ?  11/27/20 ?0616 11/28/20 ?1903 01/01/21 ?1129 07/02/21 ?1058  ?BILITOT 0.9 0.9 0.6 1.0  ?AST '21 19 18 31  '$ ?ALT '12 9 9 19  '$ ?ALKPHOS 81 72 63 78  ?PROT 5.9* 5.6* 6.1 6.1  ?ALBUMIN 2.7* 2.5* 3.4* 3.8  ? ? ?TUMOR MARKERS: ?No results for input(s): AFPTM, CEA, CA199, CHROMGRNA in the last 8760 hours. ? ?Assessment and Plan: ?71 y.o. male with debilitating dizziness/vertigo most likely due to intracranial right-sided temporal AV malformation.  ? ?After thorough discussion and shared decision making, patient decided to proceed with endovascular embolization of the right posterior cerebral artery branches which is feeding the AVM. ? ?Patient present to [facility]  today for the procedure.  ?NPO since MN ?VSS ?CBC stable  ?INR 0.9 ?BMP stable ?Patient confirms that he has been taking ASA 81 mg since 11/19/21.   ? ?Risks and benefits of cerebral angiogram with interventi

## 2021-11-24 ENCOUNTER — Other Ambulatory Visit: Payer: Self-pay | Admitting: Radiology

## 2021-11-24 ENCOUNTER — Encounter (HOSPITAL_COMMUNITY): Payer: Self-pay | Admitting: Interventional Radiology

## 2021-11-24 NOTE — Anesthesia Preprocedure Evaluation (Addendum)
Anesthesia Evaluation  ?Patient identified by MRN, date of birth, ID band ?Patient awake ? ? ? ?Reviewed: ?Allergy & Precautions, H&P , NPO status , Patient's Chart, lab work & pertinent test results ? ?Airway ?Mallampati: II ? ? ?Neck ROM: full ? ? ? Dental ?  ?Pulmonary ?sleep apnea , COPD, Current Smoker,  ?  ?breath sounds clear to auscultation ? ? ? ? ? ? Cardiovascular ?hypertension, + Peripheral Vascular Disease  ? ?Rhythm:regular Rate:Normal ? ? ?  ?Neuro/Psych ?PSYCHIATRIC DISORDERS Depression Cerebral AVM. ?  ? GI/Hepatic ?  ?Endo/Other  ? ? Renal/GU ?  ? ?  ?Musculoskeletal ? ? Abdominal ?  ?Peds ? Hematology ?  ?Anesthesia Other Findings ? ? Reproductive/Obstetrics ? ?  ? ? ? ? ? ? ? ? ? ? ? ? ? ?  ?  ? ? ? ? ? ? ? ?Anesthesia Physical ?Anesthesia Plan ? ?ASA: 3 ? ?Anesthesia Plan: General  ? ?Post-op Pain Management:   ? ?Induction: Intravenous ? ?PONV Risk Score and Plan: 1 and Ondansetron, Dexamethasone, Midazolam and Treatment may vary due to age or medical condition ? ?Airway Management Planned: Oral ETT ? ?Additional Equipment: Arterial line ? ?Intra-op Plan:  ? ?Post-operative Plan: Extubation in OR ? ?Informed Consent: I have reviewed the patients History and Physical, chart, labs and discussed the procedure including the risks, benefits and alternatives for the proposed anesthesia with the patient or authorized representative who has indicated his/her understanding and acceptance.  ? ? ? ?Dental advisory given ? ?Plan Discussed with: CRNA, Anesthesiologist and Surgeon ? ?Anesthesia Plan Comments: (See PAT note written 11/24/2021 by Myra Gianotti, PA-C.   ?)  ? ? ? ? ?Anesthesia Quick Evaluation ? ?

## 2021-11-24 NOTE — Progress Notes (Addendum)
Anesthesia Chart Review: SAME DAY WORK-UP ? Case: [id number] Date/Time: 11/25/21 0815  ? Procedure: EMBOLIZATION  ? Anesthesia type: General  ? Pre-op diagnosis: AVM  ? Location: [facility] OR RADIOLOGY ROOM / [place] [facility]  ? Surgeons: Luanne Bras, MD  ? ?  ? ? ?DISCUSSION: Patient is a 71 year old male scheduled for the above procedure. He has a right posterior temporal occipital AVM. He had previously declined AVM intervention in 05/2019.  Reevaluation earlier this year for debilitating dizziness/vertigo and patient opted for endovascular embolization of the AVM.  Per IR, he was instructed to start aspirin 81 mg on 11/19/2021. ? ?Other history includes smoking, COPD (mixed COPD/diastolic HF exacerbation 10/7739 requiring intubation), OSA, glaucoma, prostate cancer (01/20/11, s/p androgen deprivation, radiotherapy in [facility]), ureteral stricture (TURP 11/12/13; s/p balloon dilation 06/27/18), LLL CAP (with encephalopathy, hypoxia requiring intubation & in setting of alcohol intoxication with ethyl alcohol level of 365 10/2020).  ? ?He is a same-day work-up, so labs and EKG on arrival as indicated. Anesthesia team to evaluate on the day of procedure. ? ?He reported alcohol intake as "a couple of pints per week". FYI staff message sent to Dr. Estanislado Pandy since admission is anticipated.  ? ? ?VS:  ?BP Readings from Last 3 Encounters:  ?10/12/21 108/71  ?09/17/21 131/87  ?09/09/21 106/72  ? ?Pulse Readings from Last 3 Encounters:  ?10/12/21 72  ?09/17/21 93  ?09/09/21 95  ?  ? ?PROVIDERS: ?Claretta Fraise, MD is PCP.  Last visit 09/09/2021.  He was referred to general surgery for left inguinal hernia. ?- Franchot Gallo, MD is urologist ?- Pappayliou, Barnetta Chapel, DO is general surgeon. Evaluation on 09/17/21. She and patient agreed he should undergo embolization of his cerebral AVM prior to undergoing repair of his left inguinal hernia. ? ? ?LABS: He is for labs on the day of surgery as indicated.  Currently last results include: ?Lab Results   ?Component Value Date  ? WBC 6.0 10/12/2021  ? HGB 17.3 (H) 10/12/2021  ? HCT 48.6 10/12/2021  ? PLT 227 10/12/2021  ? GLUCOSE 95 10/12/2021  ? ALT 19 07/02/2021  ? AST 31 07/02/2021  ? NA 131 (L) 10/12/2021  ? K 4.2 10/12/2021  ? CL 93 (L) 10/12/2021  ? CREATININE 0.66 10/12/2021  ? BUN 6 (L) 10/12/2021  ? CO2 30 10/12/2021  ? TSH 1.385 11/27/2020  ? INR 0.9 10/12/2021  ? ? ?IMAGES: ?BILATERAL COMMON CAROTID AND INNOMINATE ANGIOGRAPHY 10/12/21: ?IMPRESSION: ?- Fast flow arteriovenous malformation involving the inferior aspect ?of the mid inferior right temporal lobe with a nidus of 22 mm x 18 ?mm x 24 mm with 2 draining veins as described above. Moderate ?fusiform dilatation of the prominent superior vein proximally. ?- No evidence of perinidal  or intra nidal aneurysm is seen. ?- Approximately 50% stenosis of the left vertebrobasilar junction. ?- Approximally 30-40% stenosis of the right internal carotid artery ?proximally. ?- Approximately 20% stenosis of the proximal left internal carotid ?artery. ?  ? ?EKG: Last EKG noted was from 11/26/20 (in setting CAP admission):  ?Sinus tachycardia at 116 bpm ?Paired ventricular premature complexes , new since last tracing ?Probable left atrial enlargement ?Nonspecific intraventricular conduction delay ?Probable anteroseptal infarct, old ?Minimal ST depression, anterolateral leads ?Confirmed by Dorie Rank [phone]) on 11/26/2020 6:36:27 PM ?- Baseline line wanderer in multiple leads puts some limitation on interpretation.  ? ? ?CV: ?Echo 05/26/20: ?IMPRESSIONS  ? 1. Left ventricular ejection fraction, by estimation, is 55 to 60%. The  ?left ventricle has normal  function. The left ventricle has no regional  ?wall motion abnormalities. There is mild left ventricular hypertrophy.  ?Left ventricular diastolic parameters  ?are indeterminate.  ? 2. Right ventricular systolic function is moderately reduced. The right  ?ventricular size is moderately enlarged.  ? 3. A small pericardial  effusion is present. The pericardial effusion is  ?circumferential. There is no evidence of cardiac tamponade.  ? 4. The mitral valve was not well visualized. No evidence of mitral valve  ?regurgitation. No evidence of mitral stenosis.  ? 5. The aortic valve is tricuspid. Aortic valve regurgitation is not  ?visualized. No aortic stenosis is present.  ? 6. The inferior vena cava is dilated in size with <50% respiratory  ?variability, suggesting right atrial pressure of 15 mmHg.  ? 7. Technically difficult study  ? ? ?Past Medical History:  ?Diagnosis Date  ? Acute metabolic encephalopathy 12/16/3788  ? Cancer [facility]) 2011  ? Prostate  ? 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 Community acquired pneumonia 11/27/2020  ? COPD (chronic obstructive pulmonary disease) ([place])   ? Glaucoma   ? Hypoxia 06/20/2020  ? Lactic acidosis 11/27/2020  ? Recurrent major depressive disorder, in partial remission ([place]) 04/18/2018  ? Sleep apnea   ? ? ?Past Surgical History:  ?Procedure Laterality Date  ? CYSTOSCOPY WITH URETHRAL DILATATION N/A 06/27/2018  ? Procedure: CYSTOSCOPY WITH BALLOON DILATATION OF URETHRAL STRICTURE;  Surgeon: Franchot Gallo, MD;  Location: [facility] ORS;  Service: Urology;  Laterality: N/A;  ? IR ANGIO INTRA EXTRACRAN SEL COM CAROTID INNOMINATE BILAT MOD SED  10/12/2021  ? IR ANGIO VERTEBRAL SEL SUBCLAVIAN INNOMINATE BILAT MOD SED  10/12/2021  ? IR RADIOLOGIST EVAL & MGMT  09/25/2021  ? IR RADIOLOGIST EVAL & MGMT  10/25/2021  ? IR US GUIDE VASC ACCESS RIGHT  10/12/2021  ? PROSTATE SURGERY  2011  ? seeds implanted/radiation  ? ? ?MEDICATIONS: ?No current facility-administered medications for this encounter.  ? ? albuterol (VENTOLIN HFA) 108 (90 Base) MCG/ACT inhaler  ? b complex vitamins capsule  ? fluticasone furoate-vilanterol (BREO ELLIPTA) 200-25 MCG/ACT AEPB  ? ? ?Myra Gianotti, PA-C ?Surgical Short Stay/Anesthesiology ?[facility] Phone [phone] ?[facility] Phone [phone] ?11/24/2021 4:11  PM ? ? ? ? ? ? ? ?

## 2021-11-24 NOTE — Progress Notes (Signed)
Spoke with pt for pre-op call. Pt denies cardiac history, HTN or Diabetes. Pt does have COPD and uses a Cpap. Pt states he has been taking his 81 mg Aspirin daily. ? ?Shower instructions given to pt.  ? ? ?

## 2021-11-25 ENCOUNTER — Inpatient Hospital Stay (HOSPITAL_COMMUNITY)
Admission: RE | Admit: 2021-11-25 | Discharge: 2021-11-25 | Disposition: A | Discharge status: Home / Self Care | Payer: Medicare HMO | Source: Ambulatory Visit | Attending: Interventional Radiology | Admitting: Interventional Radiology

## 2021-11-25 ENCOUNTER — Encounter (HOSPITAL_COMMUNITY)
Admission: RE | Disposition: A | Discharge status: Home / Self Care | Payer: Self-pay | Source: Home / Self Care | Attending: Interventional Radiology

## 2021-11-25 ENCOUNTER — Inpatient Hospital Stay (HOSPITAL_COMMUNITY): Payer: Medicare HMO | Admitting: Vascular Surgery

## 2021-11-25 ENCOUNTER — Ambulatory Visit (HOSPITAL_COMMUNITY)
Admission: RE | Admit: 2021-11-25 | Discharge: 2021-11-25 | DRG: 092 | Disposition: A | Discharge status: Home / Self Care | Payer: Medicare HMO | Attending: Interventional Radiology | Admitting: Interventional Radiology

## 2021-11-25 ENCOUNTER — Inpatient Hospital Stay (HOSPITAL_BASED_OUTPATIENT_CLINIC_OR_DEPARTMENT_OTHER): Payer: Medicare HMO | Admitting: Vascular Surgery

## 2021-11-25 DIAGNOSIS — Z79899 Other long term (current) drug therapy: Secondary | ICD-10-CM | POA: Diagnosis not present

## 2021-11-25 DIAGNOSIS — Z8546 Personal history of malignant neoplasm of prostate: Secondary | ICD-10-CM

## 2021-11-25 DIAGNOSIS — J449 Chronic obstructive pulmonary disease, unspecified: Secondary | ICD-10-CM | POA: Diagnosis present

## 2021-11-25 DIAGNOSIS — G4733 Obstructive sleep apnea (adult) (pediatric): Secondary | ICD-10-CM | POA: Diagnosis not present

## 2021-11-25 DIAGNOSIS — G4726 Circadian rhythm sleep disorder, shift work type: Secondary | ICD-10-CM | POA: Diagnosis present

## 2021-11-25 DIAGNOSIS — Z8249 Family history of ischemic heart disease and other diseases of the circulatory system: Secondary | ICD-10-CM | POA: Diagnosis not present

## 2021-11-25 DIAGNOSIS — F17210 Nicotine dependence, cigarettes, uncomplicated: Secondary | ICD-10-CM | POA: Diagnosis not present

## 2021-11-25 DIAGNOSIS — F3341 Major depressive disorder, recurrent, in partial remission: Secondary | ICD-10-CM | POA: Diagnosis not present

## 2021-11-25 DIAGNOSIS — H409 Unspecified glaucoma: Secondary | ICD-10-CM | POA: Diagnosis present

## 2021-11-25 DIAGNOSIS — Q2730 Arteriovenous malformation, site unspecified: Secondary | ICD-10-CM

## 2021-11-25 DIAGNOSIS — I10 Essential (primary) hypertension: Secondary | ICD-10-CM | POA: Diagnosis not present

## 2021-11-25 DIAGNOSIS — G4730 Sleep apnea, unspecified: Secondary | ICD-10-CM

## 2021-11-25 DIAGNOSIS — Z9889 Other specified postprocedural states: Principal | ICD-10-CM

## 2021-11-25 DIAGNOSIS — Z825 Family history of asthma and other chronic lower respiratory diseases: Secondary | ICD-10-CM

## 2021-11-25 DIAGNOSIS — Z8701 Personal history of pneumonia (recurrent): Secondary | ICD-10-CM

## 2021-11-25 DIAGNOSIS — Q282 Arteriovenous malformation of cerebral vessels: Principal | ICD-10-CM

## 2021-11-25 DIAGNOSIS — I5032 Chronic diastolic (congestive) heart failure: Secondary | ICD-10-CM | POA: Diagnosis not present

## 2021-11-25 DIAGNOSIS — R42 Dizziness and giddiness: Secondary | ICD-10-CM | POA: Diagnosis present

## 2021-11-25 HISTORY — PX: RADIOLOGY WITH ANESTHESIA: SHX6223

## 2021-11-25 HISTORY — PX: IR NEURO EACH ADD'L AFTER BASIC UNI RIGHT (MS): IMG5374

## 2021-11-25 HISTORY — DX: Arteriovenous malformation of cerebral vessels: Q28.2

## 2021-11-25 HISTORY — PX: IR ANGIO VERTEBRAL SEL VERTEBRAL UNI R MOD SED: IMG5368

## 2021-11-25 HISTORY — PX: IR US GUIDE VASC ACCESS RIGHT: IMG2390

## 2021-11-25 LAB — CBC WITH DIFFERENTIAL/PLATELET
Abs Immature Granulocytes: 0.01 10*3/uL (ref 0.00–0.07)
Basophils Absolute: 0.1 10*3/uL (ref 0.0–0.1)
Basophils Relative: 2 %
Eosinophils Absolute: 0.2 10*3/uL (ref 0.0–0.5)
Eosinophils Relative: 4 %
HCT: 46.1 % (ref 39.0–52.0)
Hemoglobin: 16.8 g/dL (ref 13.0–17.0)
Immature Granulocytes: 0 %
Lymphocytes Relative: 44 %
Lymphs Abs: 2.2 10*3/uL (ref 0.7–4.0)
MCH: 37.5 pg — ABNORMAL HIGH (ref 26.0–34.0)
MCHC: 36.4 g/dL — ABNORMAL HIGH (ref 30.0–36.0)
MCV: 102.9 fL — ABNORMAL HIGH (ref 80.0–100.0)
Monocytes Absolute: 0.5 10*3/uL (ref 0.1–1.0)
Monocytes Relative: 11 %
Neutro Abs: 1.9 10*3/uL (ref 1.7–7.7)
Neutrophils Relative %: 39 %
Platelets: 211 10*3/uL (ref 150–400)
RBC: 4.48 MIL/uL (ref 4.22–5.81)
RDW: 15.5 % (ref 11.5–15.5)
WBC: 4.9 10*3/uL (ref 4.0–10.5)
nRBC: 0 % (ref 0.0–0.2)

## 2021-11-25 LAB — BASIC METABOLIC PANEL
Anion gap: 10 (ref 5–15)
BUN: 7 mg/dL — ABNORMAL LOW (ref 8–23)
CO2: 31 mmol/L (ref 22–32)
Calcium: 9.2 mg/dL (ref 8.9–10.3)
Chloride: 92 mmol/L — ABNORMAL LOW (ref 98–111)
Creatinine, Ser: 0.66 mg/dL (ref 0.61–1.24)
GFR, Estimated: >60 mL/min (ref >60)
Glucose, Bld: 83 mg/dL (ref 70–99)
Potassium: 4 mmol/L (ref 3.5–5.1)
Sodium: 133 mmol/L — ABNORMAL LOW (ref 135–145)

## 2021-11-25 LAB — PROTIME-INR
INR: 0.9 (ref 0.8–1.2)
Prothrombin Time: 12.5 seconds (ref 11.4–15.2)

## 2021-11-25 LAB — POCT ACTIVATED CLOTTING TIME: Activated Clotting Time: 191 seconds

## 2021-11-25 SURGERY — IR WITH ANESTHESIA
Anesthesia: General

## 2021-11-25 MED ORDER — NITROGLYCERIN 1 MG/10 ML FOR IR/CATH LAB
INTRA_ARTERIAL | Status: AC
  Filled 2021-11-25: qty 10

## 2021-11-25 MED ORDER — LIDOCAINE 2% (20 MG/ML) 5 ML SYRINGE
INTRAMUSCULAR | Status: DC | PRN
Start: 2021-11-25 — End: 2021-11-25
  Administered 2021-11-25: 100 mg via INTRAVENOUS

## 2021-11-25 MED ORDER — NIMODIPINE 30 MG PO CAPS: 0.0000 mg | ORAL_CAPSULE | ORAL | Status: DC

## 2021-11-25 MED ORDER — PHENYLEPHRINE 40 MCG/ML (10ML) SYRINGE FOR IV PUSH (FOR BLOOD PRESSURE SUPPORT)
PREFILLED_SYRINGE | INTRAVENOUS | Status: DC | PRN
  Administered 2021-11-25 (×2): 120 ug via INTRAVENOUS
  Administered 2021-11-25 (×2): 80 ug via INTRAVENOUS

## 2021-11-25 MED ORDER — OXYCODONE HCL 5 MG PO TABS: 5.0000 mg | ORAL_TABLET | Freq: Once | ORAL | Status: DC | PRN

## 2021-11-25 MED ORDER — LIDOCAINE HCL 1 % IJ SOLN
INTRAMUSCULAR | Status: AC
  Filled 2021-11-25: qty 20

## 2021-11-25 MED ORDER — SODIUM CHLORIDE 0.9 % IV SOLN: INTRAVENOUS | Status: AC

## 2021-11-25 MED ORDER — IOHEXOL 300 MG/ML  SOLN
100.0000 mL | Freq: Once | INTRAMUSCULAR | Status: AC | PRN
  Administered 2021-11-25: 72 mL via INTRA_ARTERIAL

## 2021-11-25 MED ORDER — LACTATED RINGERS IV SOLN: INTRAVENOUS | Status: DC

## 2021-11-25 MED ORDER — CHLORHEXIDINE GLUCONATE 0.12 % MT SOLN
15.0000 mL | Freq: Once | OROMUCOSAL | Status: AC
  Administered 2021-11-25: 15 mL via OROMUCOSAL
  Filled 2021-11-25: qty 15

## 2021-11-25 MED ORDER — HEPARIN SODIUM (PORCINE) 1000 UNIT/ML IJ SOLN
INTRAMUSCULAR | Status: DC | PRN
  Administered 2021-11-25: 1000 [IU] via INTRAVENOUS

## 2021-11-25 MED ORDER — SODIUM CHLORIDE 0.9 % IV SOLN
INTRAVENOUS | Status: DC
Start: 2021-11-25 — End: 2021-11-25

## 2021-11-25 MED ORDER — SODIUM CHLORIDE 0.9 % IV SOLN: INTRAVENOUS | Status: DC | PRN

## 2021-11-25 MED ORDER — OXYCODONE HCL 5 MG/5ML PO SOLN: 5.0000 mg | Freq: Once | ORAL | Status: DC | PRN

## 2021-11-25 MED ORDER — CLEVIDIPINE BUTYRATE 0.5 MG/ML IV EMUL
INTRAVENOUS | Status: AC
  Filled 2021-11-25: qty 50

## 2021-11-25 MED ORDER — CLEVIDIPINE BUTYRATE 0.5 MG/ML IV EMUL
INTRAVENOUS | Status: DC | PRN
  Administered 2021-11-25: 2 mg/h via INTRAVENOUS

## 2021-11-25 MED ORDER — SUGAMMADEX SODIUM 200 MG/2ML IV SOLN
INTRAVENOUS | Status: DC | PRN
  Administered 2021-11-25: 200 mg via INTRAVENOUS

## 2021-11-25 MED ORDER — VERAPAMIL HCL 2.5 MG/ML IV SOLN
INTRAVENOUS | Status: AC
  Filled 2021-11-25: qty 2

## 2021-11-25 MED ORDER — HEPARIN SODIUM (PORCINE) 1000 UNIT/ML IJ SOLN
INTRAMUSCULAR | Status: AC
  Filled 2021-11-25: qty 10

## 2021-11-25 MED ORDER — ORAL CARE MOUTH RINSE: 15.0000 mL | Freq: Once | OROMUCOSAL | Status: AC

## 2021-11-25 MED ORDER — ONDANSETRON HCL 4 MG/2ML IJ SOLN
INTRAMUSCULAR | Status: DC | PRN
  Administered 2021-11-25: 4 mg via INTRAVENOUS

## 2021-11-25 MED ORDER — ROCURONIUM BROMIDE 10 MG/ML (PF) SYRINGE
PREFILLED_SYRINGE | INTRAVENOUS | Status: DC | PRN
  Administered 2021-11-25: 50 mg via INTRAVENOUS
  Administered 2021-11-25: 20 mg via INTRAVENOUS

## 2021-11-25 MED ORDER — NITROGLYCERIN 1 MG/10 ML FOR IR/CATH LAB: INTRA_ARTERIAL | Status: AC | PRN

## 2021-11-25 MED ORDER — DEXAMETHASONE SODIUM PHOSPHATE 10 MG/ML IJ SOLN
INTRAMUSCULAR | Status: DC | PRN
Start: 2021-11-25 — End: 2021-11-25
  Administered 2021-11-25: 5 mg via INTRAVENOUS

## 2021-11-25 MED ORDER — SODIUM CHLORIDE 0.9 % IV SOLN: INTRAVENOUS | Status: DC

## 2021-11-25 MED ORDER — FENTANYL CITRATE (PF) 100 MCG/2ML IJ SOLN: 25.0000 ug | INTRAMUSCULAR | Status: DC | PRN

## 2021-11-25 MED ORDER — ONDANSETRON HCL 4 MG/2ML IJ SOLN: 4.0000 mg | Freq: Four times a day (QID) | INTRAMUSCULAR | Status: DC | PRN

## 2021-11-25 MED ORDER — CEFAZOLIN SODIUM-DEXTROSE 2-4 GM/100ML-% IV SOLN
2.0000 g | INTRAVENOUS | Status: AC
  Administered 2021-11-25: 2 g via INTRAVENOUS
  Filled 2021-11-25: qty 100

## 2021-11-25 MED ORDER — HEPARIN (PORCINE) 25000 UT/250ML-% IV SOLN
INTRAVENOUS | Status: AC
  Filled 2021-11-25: qty 250

## 2021-11-25 MED ORDER — FENTANYL CITRATE (PF) 250 MCG/5ML IJ SOLN
INTRAMUSCULAR | Status: DC | PRN
  Administered 2021-11-25 (×2): 50 ug via INTRAVENOUS

## 2021-11-25 MED ORDER — KETOROLAC TROMETHAMINE 0.5 % OP SOLN
1.0000 [drp] | Freq: Four times a day (QID) | OPHTHALMIC | Status: DC
  Administered 2021-11-25: 1 [drp] via OPHTHALMIC
  Filled 2021-11-25: qty 5

## 2021-11-25 MED ORDER — IOHEXOL 300 MG/ML  SOLN
100.0000 mL | Freq: Once | INTRAMUSCULAR | Status: AC | PRN
  Administered 2021-11-25: 72 mL via INTRAVENOUS

## 2021-11-25 MED ORDER — CLEVIDIPINE BUTYRATE 0.5 MG/ML IV EMUL
0.0000 mg/h | INTRAVENOUS | Status: DC
  Filled 2021-11-25: qty 50

## 2021-11-25 MED ORDER — PROPOFOL 10 MG/ML IV BOLUS
INTRAVENOUS | Status: DC | PRN
Start: 2021-11-25 — End: 2021-11-25
  Administered 2021-11-25: 130 mg via INTRAVENOUS

## 2021-11-25 MED ORDER — PHENYLEPHRINE HCL-NACL 20-0.9 MG/250ML-% IV SOLN
INTRAVENOUS | Status: DC | PRN
Start: 2021-11-25 — End: 2021-11-25
  Administered 2021-11-25: 20 ug/min via INTRAVENOUS

## 2021-11-25 NOTE — Procedures (Deleted)
  The note originally documented on this encounter has been moved the the encounter in which it belongs.  

## 2021-11-25 NOTE — Anesthesia Procedure Notes (Signed)
Arterial Line Insertion ?Start/End3/29/2023 8:00 AM, 11/25/2021 8:10 AM ?Performed by: Amadeo Garnet, CRNA, CRNA ? Patient location: Pre-op. ?Preanesthetic checklist: patient identified, IV checked, site marked, risks and benefits discussed, surgical consent, monitors and equipment checked, pre-op evaluation, timeout performed and anesthesia consent ?Lidocaine 1% used for infiltration ?Left, radial was placed ?Catheter size: 20 G ?Hand hygiene performed  and maximum sterile barriers used  ? ?Attempts: 1 ?Procedure performed without using ultrasound guided technique. ?Following insertion, Biopatch. ?Post procedure assessment: normal ? ?Patient tolerated the procedure well with no immediate complications. ? ? ?

## 2021-11-25 NOTE — Discharge Instructions (Addendum)
Please avoid lifting anything greater than 10 lbs with the right hand for one week. If the right arterial procedure site becomes red, warm or tender please notify our team. Ok to shower but please do not submerge the site in water for one week.  ? ?Please use the eye drops provided by the anesthesia. Instill one drop to the left eye four times daily. If eye irritation persists please contact your primary care provider.  ?

## 2021-11-25 NOTE — Transfer of Care (Signed)
Immediate Anesthesia Transfer of Care Note ? ?Patient: Steven Howard ? ?Procedure(s) Performed: EMBOLIZATION ? ?Patient Location: PACU ? ?Anesthesia Type:General ? ?Level of Consciousness: awake, alert  and oriented ? ?Airway & Oxygen Therapy: Patient Spontanous Breathing and Patient connected to face mask oxygen ? ?Post-op Assessment: Report given to RN, Post -op Vital signs reviewed and stable and Patient moving all extremities ? ?Post vital signs: Reviewed and stable ? ?Last Vitals:  ?Vitals Value Taken Time  ?BP 124/76 11/25/21 1115  ?Temp    ?Pulse 92 11/25/21 1121  ?Resp 13 11/25/21 1121  ?SpO2 98 % 11/25/21 1121  ?Vitals shown include unvalidated device data. ? ?Last Pain:  ?Vitals:  ? 11/25/21 0801  ?TempSrc:   ?PainSc: 0-No pain  ?   ? ?  ? ?Complications: No notable events documented. ?

## 2021-11-25 NOTE — Sedation Documentation (Signed)
Handoff with PACU RN at 11:15.  Right radial TR band with 10cc air, pulse palpable, no hematoma, site clean/dry/intact. Air was instilled at 10:48.   ? ? ?

## 2021-11-25 NOTE — Discharge Summary (Signed)
? ?Patient ID: ?Steven Howard ?MRN: [id number] ?DOB/AGE: [birth year] 71 y.o. ? ?Admit date: 11/25/2021 ?Discharge date: 11/25/2021 ? ?Supervising Physician: Luanne Bras ? ?Patient Status: [facility]  ? ?Admission Diagnoses: Intracranial right-sided temporal AV malformation  ? ?Discharge Diagnoses: Same ? ?Discharged Condition: good ? ?Hospital Course: Patient presented to the Neuro Interventional Radiology department today for an elective image-guided diagnostic cerebral angiogram with possible intervention to treat an intracranial right-sided temporal AV malformation. This procedure was done under general anesthesia with planned overnight observation in the Neuro ICU. However, no  intervention was performed today. The patient underwent a diagnostic cerebral angiogram only. Please see the full dictation under the imaging tab in Epic. The patient was discharged to PACU for 4 hour recovery/observation with plans to discharge home.  ? ?The patient has recovered and returned to baseline. He is awake, alert and oriented. The right arterial vascular site is clean, soft and dry. No evidence of bleeding or hematoma. He did complain of some left eye irritation/redness and anesthesia provided eye drops for him - one drop to the left eye 4 times daily. The patient will self-administer his home supply of inhalers (history of COPD) prior to discharge.   ? ?The patient will follow up with Dr. Estanislado Pandy in approximately one week for further discussion/evaluation. A scheduler from our office will call the patient with a date/time. The patient was instructed to avoid lifting anything greater than 10 lb with his right arm/hand for one week.  ? ?Consults: None ? ?Significant Diagnostic Studies: ?No results found. ? ?Treatments: PACU observation  ? ?Discharge Exam: ?Blood pressure (!) 128/110, pulse 92, temperature 97.6 ?F (36.4 ?C), resp. rate 13, height 6' (1.829 m), weight 220 lb (99.8 kg), SpO2 (!) 86 %. ?Physical  Exam ?Constitutional:   ?   General: He is not in acute distress. ?   Appearance: He is not ill-appearing.  ?Cardiovascular:  ?   Comments: Right arterial vascular site is clean, soft, dry and non-tender. TR band has been removed. Tegaderm is over the site.  ?Pulmonary:  ?   Effort: Pulmonary effort is normal.  ?Skin: ?   General: Skin is warm and dry.  ?Neurological:  ?   Mental Status: He is alert and oriented to person, place, and time.  ? ? ?Disposition: Discharge disposition: 01-Home or Self Care ? ? ?Allergies as of 11/25/2021   ?No Known Allergies ?  ? ?  ?Medication List  ?  ? ?TAKE these medications   ? ?albuterol 108 (90 Base) MCG/ACT inhaler ?Commonly known as: VENTOLIN HFA ?Inhale 2 puffs into the lungs every 4 (four) hours as needed for wheezing or shortness of breath (cough, shortness of breath or wheezing.). ?What changed: how much to take ?  ?aspirin EC 81 MG tablet ?Take 81 mg by mouth daily. Swallow whole. ?  ?b complex vitamins capsule ?Take 1 capsule by mouth 2 (two) times a week. ?  ?fluticasone furoate-vilanterol 200-25 MCG/ACT Aepb ?Commonly known as: Breo Ellipta ?Inhale 1 puff into the lungs daily. ?  ? ?  ? ? Follow-up Information   ? ? Luanne Bras, MD Follow up.   ?Specialties: Interventional Radiology, Radiology ?Why: Please follow up with Dr. Estanislado Pandy in one week. A scheduler from our office will call you with a date/time. Please call our office with any questions/concerns prior to your visit. ?Contact information: ?[address] ?[place],  [place] [address] ?[place] [address] ?[phone] ? ? ?  ?  ? ?  ?  ? ?  ?  ? ?  Electronically Signed: ?Theresa Duty, NP ?11/25/2021, 3:40 PM ? ? ?I have spent Greater Than 30 Minutes discharging Steven Howard. ?

## 2021-11-25 NOTE — Procedures (Signed)
INR. ?Status post right vertebral arterial arteriogram.  Right radial approach. ?Findings. ?1.  Prominent arteriovenous shunting and D2 a moderate-sized arteriovenous malformation being filled primarily from the distal right P3 segment of the right posterior cerebral artery with early opacification of normal-appearing occipital cortical branches. ?2.  Angio architecture explored with superselective microcatheter injections in the right PCA territory. ?3.  Prominent superior cerebellar and posterior temporal cortical veins noted along the superior aspect of the tentorium cerebellum. ?Help patient extubated.  Waking up slowly.  Follows simple commands appropriately. ?Distal right radial pulse present. ? ?Steven Wilhelmi MD ?

## 2021-11-25 NOTE — Anesthesia Procedure Notes (Signed)
Procedure Name: Intubation ?Date/Time: 11/25/2021 8:52 AM ?Performed by: Amadeo Garnet, CRNA ?Pre-anesthesia Checklist: Patient identified, Emergency Drugs available, Suction available and Patient being monitored ?Patient Re-evaluated:Patient Re-evaluated prior to induction ?Oxygen Delivery Method: Circle system utilized ?Preoxygenation: Pre-oxygenation with 100% oxygen ?Induction Type: IV induction ?Ventilation: Mask ventilation without difficulty ?Laryngoscope Size: Mac and 4 ?Grade View: Grade I ?Tube type: Oral ?Tube size: 7.5 mm ?Number of attempts: 1 ?Airway Equipment and Method: Stylet and Oral airway ?Placement Confirmation: ETT inserted through vocal cords under direct vision, positive ETCO2 and breath sounds checked- equal and bilateral ?Secured at: 22 cm ?Tube secured with: Tape ?Dental Injury: Teeth and Oropharynx as per pre-operative assessment  ? ? ? ? ?

## 2021-11-26 ENCOUNTER — Telehealth (HOSPITAL_COMMUNITY): Payer: Self-pay

## 2021-11-26 ENCOUNTER — Encounter (HOSPITAL_COMMUNITY): Payer: Self-pay | Admitting: Interventional Radiology

## 2021-11-26 NOTE — Anesthesia Postprocedure Evaluation (Signed)
Anesthesia Post Note ? ?Patient: Steven Howard ? ?Procedure(s) Performed: EMBOLIZATION ? ?  ? ?Patient location during evaluation: PACU ?Anesthesia Type: General ?Level of consciousness: awake and alert ?Pain management: pain level controlled ?Vital Signs Assessment: post-procedure vital signs reviewed and stable ?Respiratory status: spontaneous breathing, nonlabored ventilation, respiratory function stable and patient connected to nasal cannula oxygen ?Cardiovascular status: blood pressure returned to baseline and stable ?Postop Assessment: no apparent nausea or vomiting ?Anesthetic complications: no ? ? ?No notable events documented. ? ?Last Vitals:  ?Vitals:  ? 11/25/21 1520 11/25/21 1530  ?BP: 117/79 128/73  ?Pulse: 86 87  ?Resp: (!) 5 12  ?Temp:  36.7 ?C  ?SpO2: 96% 96%  ?  ?Last Pain:  ?Vitals:  ? 11/25/21 1530  ?TempSrc:   ?PainSc: 0-No pain  ? ? ?  ?  ?  ?  ?  ?  ? ?Britton S ? ? ? ? ?

## 2021-11-26 NOTE — Telephone Encounter (Signed)
Called to schedule f/u, pt states that he does not want to schedule at all. AW  ?

## 2021-12-30 ENCOUNTER — Ambulatory Visit: Payer: Medicare HMO | Admitting: Family Medicine

## 2022-02-26 DIAGNOSIS — R8290 Unspecified abnormal findings in urine: Secondary | ICD-10-CM | POA: Diagnosis not present

## 2022-02-26 DIAGNOSIS — F17210 Nicotine dependence, cigarettes, uncomplicated: Secondary | ICD-10-CM | POA: Diagnosis not present

## 2022-02-26 DIAGNOSIS — F10929 Alcohol use, unspecified with intoxication, unspecified: Secondary | ICD-10-CM | POA: Diagnosis not present

## 2022-02-26 DIAGNOSIS — R000 Tachycardia, unspecified: Secondary | ICD-10-CM | POA: Diagnosis not present

## 2022-02-26 DIAGNOSIS — F10129 Alcohol abuse with intoxication, unspecified: Secondary | ICD-10-CM | POA: Diagnosis not present

## 2022-03-09 ENCOUNTER — Ambulatory Visit: Payer: Medicare HMO | Admitting: Urology

## 2022-05-04 IMAGING — US IR ANGIO INTRA EXTRACRAN SEL COM CAROTID INNOMINATE BILAT MOD SE
1 series · 4 of 4 positions shown · non-contrast
Comparison: MRI of the brain May 25, 2019, and CT of the
head May 25, 2020 and CT of the head November 26, 2020.

CLINICAL DATA: Worsening lightheadedness and gait imbalance.
Patient with known history of right posterior temporal occipital
region arteriovenous malformation on MRI of the brain in 5353.

EXAM:
BILATERAL COMMON CAROTID AND INNOMINATE ANGIOGRAPHY
TECHNIQUE: Informed written consent was obtained from the patient after a
thorough discussion of the procedural risks, benefits and
alternatives. All questions were addressed. Maximal Sterile Barrier
Technique was utilized including caps, mask, sterile gowns, sterile
gloves, sterile drape, hand hygiene and skin antiseptic. A timeout
was performed prior to the initiation of the procedure.

[Series 1: ir (id) (id) · 4 of 4 slices shown]
[im 1/4]
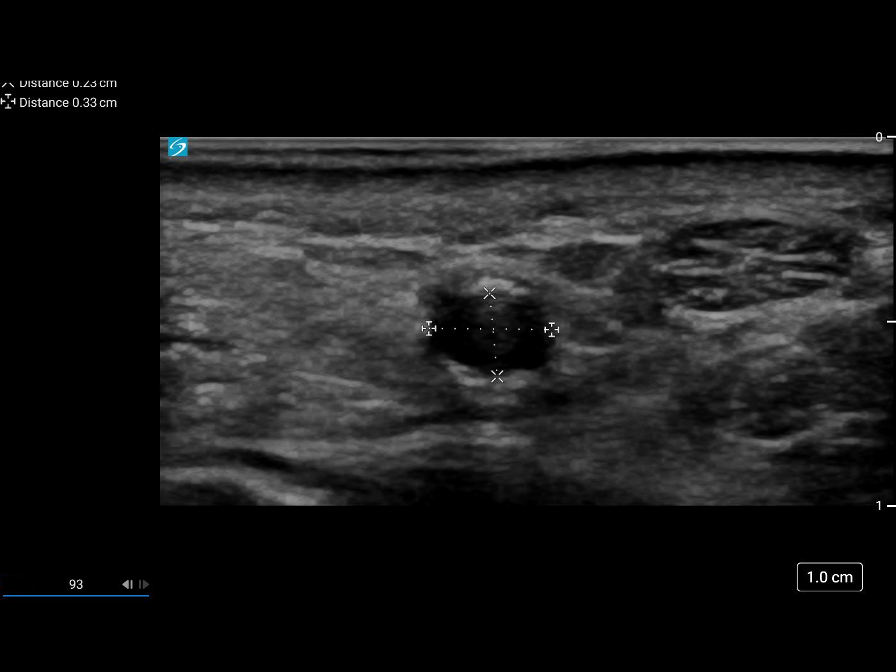
[im 2/4]
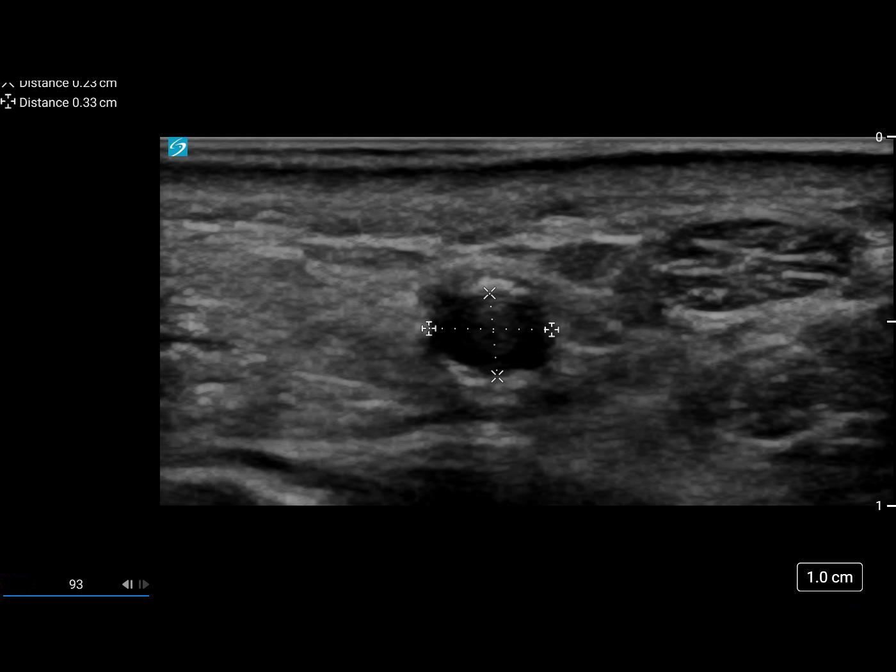
[im 3/4]
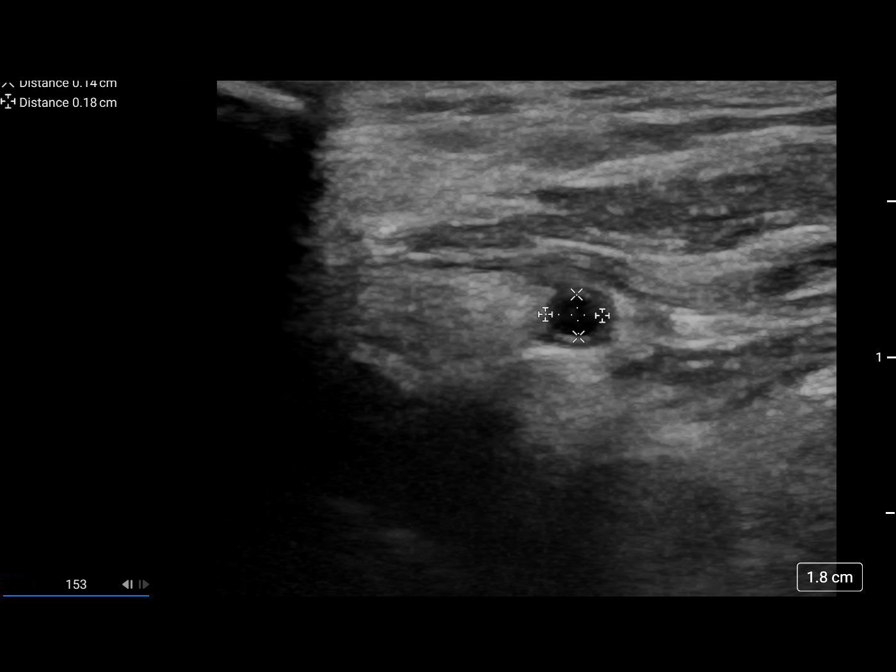
[im 4/4]
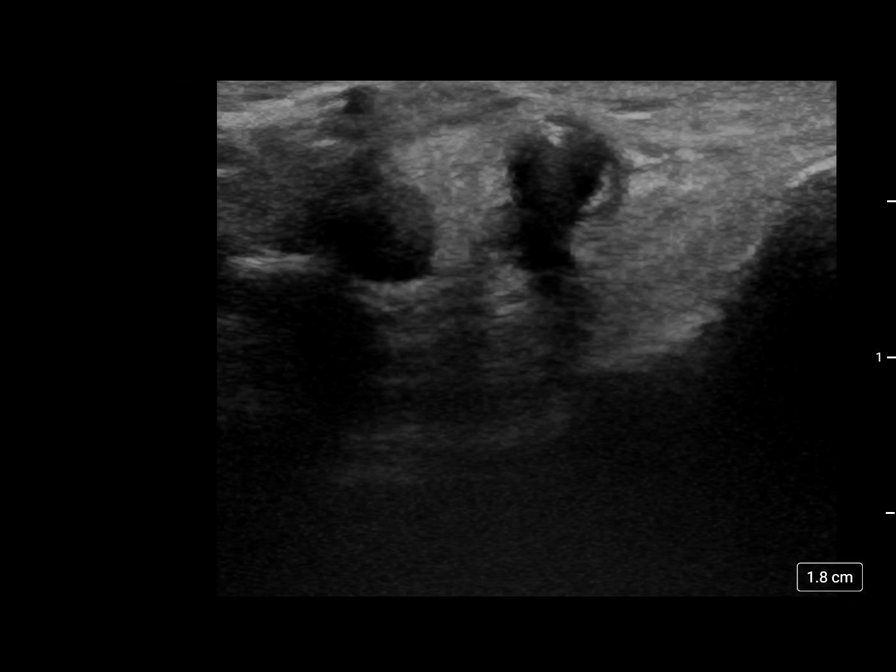

[4 of 4 positions shown; findings below may reference images not displayed]

MEDICATIONS:
Heparin 4111 units intra-arterial. No antibiotic was administered
within 1 hour of the procedure.

ANESTHESIA/SEDATION:
Versed 1 mg IV; Fentanyl 25 mcg IV

Moderate Sedation Time:  47 minutes

The patient was continuously monitored during the procedure by the
interventional radiology nurse under my direct supervision.

CONTRAST:  Omnipaque 65 cc.

FLUOROSCOPY TIME:  Fluoroscopy Time: 12 minutes 6 seconds (6645
mGy).

COMPLICATIONS:
None immediate.
The right the right forearm to the wrist was prepped and draped in
the usual sterile manner. The right radial artery was then
identified with ultrasound, and its morphology documented on a
permanent basis in the hospital PACS system.

A dorsal palmar anastomosis was verified to be present. Using
ultrasound guidance, and a micropuncture set, access into the right
radial artery was then obtained over a 0.018 inch micro guidewire
with a [DATE] French radial sheath.

The obturator and micro guidewire were removed. Good aspiration
obtained from the side port of the radial sheath. A cocktail of 4111
units of heparin, 200 mcg of nitroglycerin, and 2.5 mg of verapamil
was then infused in diluted form through the radial sheath without
event. Right radial arteriogram was then performed.

Over an 035 inch Roadrunner guidewire, a 5 Haumati Ipumbu 2
diagnostic catheter was then advanced to the aortic arch region and
selectively positioned in the right vertebral artery, the left
common carotid artery, the left vertebral artery and the right
common carotid artery.

Right radial wrist band was applied for hemostasis at the right
radial puncture site. A distal radial pulse was verified to be
present.
FINDINGS: A prominent right vertebral artery origin is widely patent.

The vessel is seen to opacify to the cranial skull base. Patency is
seen of the right vertebrobasilar junction and the right
posterior-inferior cerebellar artery.

The posterior cerebral arteries, the superior cerebellar arteries
and the anterior-inferior cerebellar arteries demonstrate wide
patency into the capillary and venous phases.

Abnormal prominence of the right posterior cerebral aartery P1 to
the P3 region is noted with appearance of an approximately 22 mm x
18 mm x 24 mm nidus of fast flow arteriovenous malformation. Venous
outflow via a single vein eminating from the superolateral aspect of
the nidus, branching into a superficial branch within the middle
cranial fossa projecting slightly posteriorly and laterally.

Anterior extension of the nidus into the inferior aspect of the
anterior temporal lobe is noted also.

There is a moderate fusiform dilatation of the main draining vein at
the superior aspect of the nidus. No evidence of pre nidal, peri
nidal or intra nidal aneurysms is evident.

Left vertebral artery origin is widely patent.

The vessel has mild tortuosity proximally. More distally, the vessel
is seen to opacify to the cranial skull base. The wide patency is
seen of the left vertebrobasilar junction with a moderate stenosis
in the region of the left posterior-inferior cerebellar artery.

More distally, the basilar artery, the posterior cerebral arteries,
the superior cerebellar arteries and the opacified segments of the
anterior-inferior cerebellar arteries opacify into the capillary and
venous phases. Noted is a fast flow arteriovenous malformation with
abnormally prominent right posterior cerebral artery.

Right common carotid arteriogram demonstrates the right external
carotid artery and its major branches to be widely patent.

Right internal carotid artery just distal to the bulb extending
slightly distally demonstrates an irregular atherosclerotic plaque
resulting in approximately 30-40% stenosis at its most severe by the
NASCET criteria.

No evidence of ulceration is seen.

More distally, the right internal carotid artery opacifies into the
capillary and venous phases.

The petrous, the cavernous and the supraclinoid segments demonstrate
wide patency.

Abnormal prominence of the right posterior communicating artery is
seen with subsequent opacification into an abnormally prominent
right posterior cerebral artery with 2-3 arterial feeders supplying
the superior aspect of the nidus of the arteriovenous malformation.
The nidus lies along the superior aspect of the tentorium.

Left common carotid arteriogram demonstrates the left external
carotid artery and its major branches to be widely patent.

The left internal carotid artery at the bulb demonstrates an
irregular atherosclerotic plaque along the posterior wall of bulb
with an approximately 20% stenosis by the NASCET criteria.

More distally, the vessel is seen to opacify to the cranial skull
base. Wide patency is seen of the left petrous, cavernous and
supraclinoid segments.

The left middle cerebral artery and the left anterior cerebral
artery opacify into the capillary and venous phases.
IMPRESSION: Fast flow arteriovenous malformation involving the inferior aspect
of the mid inferior right temporal lobe with a nidus of 22 mm x 18
mm x 24 mm with 2 draining veins as described above. Moderate
fusiform dilatation of the prominent superior vein proximally.

No evidence of perinidal  or intra nidal aneurysm is seen.

Approximately 50% stenosis of the left vertebrobasilar junction.

Approximally 30-40% stenosis of the right internal carotid artery
proximally.

Approximately 20% stenosis of the proximal left internal carotid
artery.

PLAN:
Findings reviewed with the patient and the patient's son. Follow-up
in the clinic to discuss subsequent management of the arteriovenous
malformation.

Both the patient and son expressed understanding and agreement with
the above management plan.

## 2022-12-09 ENCOUNTER — Telehealth: Payer: Self-pay | Admitting: Family Medicine

## 2022-12-29 NOTE — Telephone Encounter (Signed)
On call provider received call from [facility] pertaining to patient being deceased in his home. Aware pt has not been seen in our office since 08/2021. EMS states they will call the ME.

## 2022-12-29 DEATH — deceased
